# Patient Record
Sex: Female | Born: 1940
Health system: Southern US, Community
[De-identification: ages and names within clinical notes are randomized; demographics above are authoritative.]

## PROBLEM LIST (undated history)

## (undated) DIAGNOSIS — M1990 Unspecified osteoarthritis, unspecified site: Secondary | ICD-10-CM

## (undated) DIAGNOSIS — Z87442 Personal history of urinary calculi: Secondary | ICD-10-CM

## (undated) DIAGNOSIS — T4145XA Adverse effect of unspecified anesthetic, initial encounter: Secondary | ICD-10-CM

## (undated) DIAGNOSIS — Z973 Presence of spectacles and contact lenses: Secondary | ICD-10-CM

## (undated) DIAGNOSIS — C801 Malignant (primary) neoplasm, unspecified: Secondary | ICD-10-CM

## (undated) DIAGNOSIS — R351 Nocturia: Secondary | ICD-10-CM

## (undated) DIAGNOSIS — K219 Gastro-esophageal reflux disease without esophagitis: Secondary | ICD-10-CM

## (undated) DIAGNOSIS — J189 Pneumonia, unspecified organism: Secondary | ICD-10-CM

## (undated) DIAGNOSIS — N1330 Unspecified hydronephrosis: Secondary | ICD-10-CM

## (undated) DIAGNOSIS — T8859XA Other complications of anesthesia, initial encounter: Secondary | ICD-10-CM

## (undated) DIAGNOSIS — R739 Hyperglycemia, unspecified: Secondary | ICD-10-CM

## (undated) DIAGNOSIS — F32A Depression, unspecified: Secondary | ICD-10-CM

## (undated) DIAGNOSIS — E119 Type 2 diabetes mellitus without complications: Secondary | ICD-10-CM

## (undated) DIAGNOSIS — F419 Anxiety disorder, unspecified: Secondary | ICD-10-CM

## (undated) DIAGNOSIS — R350 Frequency of micturition: Secondary | ICD-10-CM

## (undated) DIAGNOSIS — N200 Calculus of kidney: Secondary | ICD-10-CM

## (undated) DIAGNOSIS — I10 Essential (primary) hypertension: Secondary | ICD-10-CM

## (undated) DIAGNOSIS — Z803 Family history of malignant neoplasm of breast: Secondary | ICD-10-CM

## (undated) DIAGNOSIS — H3530 Unspecified macular degeneration: Secondary | ICD-10-CM

## (undated) HISTORY — PX: TONSILLECTOMY AND ADENOIDECTOMY: SUR1326

## (undated) HISTORY — DX: Family history of malignant neoplasm of breast: Z80.3

## (undated) HISTORY — PX: CATARACT EXTRACTION W/ INTRAOCULAR LENS  IMPLANT, BILATERAL: SHX1307

---

## 1998-09-12 ENCOUNTER — Ambulatory Visit (HOSPITAL_COMMUNITY): Admission: RE | Admit: 1998-09-12 | Discharge: 1998-09-12 | Payer: Self-pay | Admitting: Gastroenterology

## 2001-12-26 ENCOUNTER — Ambulatory Visit (HOSPITAL_COMMUNITY): Admission: RE | Admit: 2001-12-26 | Discharge: 2001-12-26 | Payer: Self-pay | Admitting: Gastroenterology

## 2003-06-07 ENCOUNTER — Encounter: Admission: RE | Admit: 2003-06-07 | Discharge: 2003-06-07 | Payer: Self-pay

## 2003-09-11 ENCOUNTER — Encounter: Admission: RE | Admit: 2003-09-11 | Discharge: 2003-09-11 | Payer: Self-pay

## 2003-09-20 ENCOUNTER — Encounter: Admission: RE | Admit: 2003-09-20 | Discharge: 2003-09-20 | Payer: Self-pay

## 2004-01-28 ENCOUNTER — Encounter: Admission: RE | Admit: 2004-01-28 | Discharge: 2004-01-28 | Payer: Self-pay

## 2005-04-15 ENCOUNTER — Inpatient Hospital Stay (HOSPITAL_COMMUNITY): Admission: EM | Admit: 2005-04-15 | Discharge: 2005-05-04 | Payer: Self-pay | Admitting: Urology

## 2005-04-15 ENCOUNTER — Ambulatory Visit: Payer: Self-pay | Admitting: Pulmonary Disease

## 2005-04-15 ENCOUNTER — Ambulatory Visit: Payer: Self-pay | Admitting: Physical Medicine & Rehabilitation

## 2005-04-15 ENCOUNTER — Ambulatory Visit (HOSPITAL_COMMUNITY): Admission: RE | Admit: 2005-04-15 | Discharge: 2005-04-15 | Payer: Self-pay | Admitting: Urology

## 2005-04-15 ENCOUNTER — Emergency Department (HOSPITAL_COMMUNITY): Admission: EM | Admit: 2005-04-15 | Discharge: 2005-04-15 | Payer: Self-pay | Admitting: Emergency Medicine

## 2005-04-15 ENCOUNTER — Ambulatory Visit (HOSPITAL_BASED_OUTPATIENT_CLINIC_OR_DEPARTMENT_OTHER): Admission: RE | Admit: 2005-04-15 | Discharge: 2005-04-15 | Payer: Self-pay | Admitting: Urology

## 2005-04-15 HISTORY — PX: OTHER SURGICAL HISTORY: SHX169

## 2005-04-16 ENCOUNTER — Encounter (INDEPENDENT_AMBULATORY_CARE_PROVIDER_SITE_OTHER): Payer: Self-pay | Admitting: *Deleted

## 2005-04-28 ENCOUNTER — Ambulatory Visit: Payer: Self-pay | Admitting: Cardiology

## 2005-04-29 ENCOUNTER — Encounter: Payer: Self-pay | Admitting: Cardiology

## 2005-05-04 ENCOUNTER — Inpatient Hospital Stay
Admission: RE | Admit: 2005-05-04 | Discharge: 2005-05-11 | Payer: Self-pay | Admitting: Physical Medicine & Rehabilitation

## 2005-05-20 ENCOUNTER — Ambulatory Visit: Payer: Self-pay

## 2005-05-28 ENCOUNTER — Ambulatory Visit (HOSPITAL_COMMUNITY): Admission: RE | Admit: 2005-05-28 | Discharge: 2005-05-28 | Payer: Self-pay | Admitting: Urology

## 2005-06-16 ENCOUNTER — Encounter: Admission: RE | Admit: 2005-06-16 | Discharge: 2005-06-16 | Payer: Self-pay | Admitting: *Deleted

## 2005-06-17 ENCOUNTER — Ambulatory Visit (HOSPITAL_BASED_OUTPATIENT_CLINIC_OR_DEPARTMENT_OTHER): Admission: RE | Admit: 2005-06-17 | Discharge: 2005-06-17 | Payer: Self-pay | Admitting: *Deleted

## 2005-06-17 ENCOUNTER — Ambulatory Visit (HOSPITAL_COMMUNITY): Admission: RE | Admit: 2005-06-17 | Discharge: 2005-06-17 | Payer: Self-pay | Admitting: *Deleted

## 2005-06-17 HISTORY — PX: CARPAL TUNNEL RELEASE: SHX101

## 2005-07-24 ENCOUNTER — Ambulatory Visit: Payer: Self-pay | Admitting: Cardiology

## 2005-09-29 ENCOUNTER — Inpatient Hospital Stay (HOSPITAL_COMMUNITY): Admission: RE | Admit: 2005-09-29 | Discharge: 2005-10-02 | Payer: Self-pay | Admitting: Orthopedic Surgery

## 2005-09-29 HISTORY — PX: TOTAL KNEE ARTHROPLASTY: SHX125

## 2005-10-02 ENCOUNTER — Ambulatory Visit: Payer: Self-pay | Admitting: Physical Medicine & Rehabilitation

## 2005-10-02 ENCOUNTER — Inpatient Hospital Stay
Admission: RE | Admit: 2005-10-02 | Discharge: 2005-10-10 | Payer: Self-pay | Admitting: Physical Medicine & Rehabilitation

## 2005-11-23 ENCOUNTER — Encounter: Admission: RE | Admit: 2005-11-23 | Discharge: 2005-11-23 | Payer: Self-pay | Admitting: Orthopedic Surgery

## 2007-03-15 ENCOUNTER — Encounter: Admission: RE | Admit: 2007-03-15 | Discharge: 2007-03-15 | Payer: Self-pay

## 2008-04-04 ENCOUNTER — Encounter: Admission: RE | Admit: 2008-04-04 | Discharge: 2008-04-04 | Payer: Self-pay

## 2009-06-21 ENCOUNTER — Encounter: Admission: RE | Admit: 2009-06-21 | Discharge: 2009-06-21 | Payer: Self-pay | Admitting: Orthopedic Surgery

## 2010-09-14 ENCOUNTER — Emergency Department (HOSPITAL_COMMUNITY): Admission: EM | Admit: 2010-09-14 | Discharge: 2010-09-15 | Payer: Self-pay | Admitting: Emergency Medicine

## 2011-01-20 LAB — HEMOCCULT GUIAC POC 1CARD (OFFICE): Fecal Occult Bld: POSITIVE

## 2011-01-20 LAB — CBC
HCT: 36.2 % (ref 36.0–46.0)
Hemoglobin: 12 g/dL (ref 12.0–15.0)
MCH: 31.2 pg (ref 26.0–34.0)
MCHC: 33.1 g/dL (ref 30.0–36.0)
MCV: 94 fL (ref 78.0–100.0)
Platelets: 285 10*3/uL (ref 150–400)
RBC: 3.85 MIL/uL — ABNORMAL LOW (ref 3.87–5.11)
RDW: 14.9 % (ref 11.5–15.5)
WBC: 6.3 10*3/uL (ref 4.0–10.5)

## 2011-01-20 LAB — COMPREHENSIVE METABOLIC PANEL
ALT: 12 U/L (ref 0–35)
AST: 21 U/L (ref 0–37)
Albumin: 3.1 g/dL — ABNORMAL LOW (ref 3.5–5.2)
Alkaline Phosphatase: 48 U/L (ref 39–117)
BUN: 9 mg/dL (ref 6–23)
CO2: 26 mEq/L (ref 19–32)
Calcium: 8.3 mg/dL — ABNORMAL LOW (ref 8.4–10.5)
Chloride: 106 mEq/L (ref 96–112)
Creatinine, Ser: 0.77 mg/dL (ref 0.4–1.2)
GFR calc Af Amer: >60 mL/min (ref >60)
GFR calc non Af Amer: >60 mL/min (ref >60)
Glucose, Bld: 113 mg/dL — ABNORMAL HIGH (ref 70–99)
Potassium: 3.1 mEq/L — ABNORMAL LOW (ref 3.5–5.1)
Sodium: 139 mEq/L (ref 135–145)
Total Bilirubin: 0.6 mg/dL (ref 0.3–1.2)
Total Protein: 6.2 g/dL (ref 6.0–8.3)

## 2011-01-20 LAB — DIFFERENTIAL
Basophils Absolute: 0 10*3/uL (ref 0.0–0.1)
Basophils Relative: 1 % (ref 0–1)
Eosinophils Absolute: 0 10*3/uL (ref 0.0–0.7)
Eosinophils Relative: 0 % (ref 0–5)
Lymphocytes Relative: 22 % (ref 12–46)
Lymphs Abs: 1.4 10*3/uL (ref 0.7–4.0)
Monocytes Absolute: 0.9 10*3/uL (ref 0.1–1.0)
Monocytes Relative: 15 % — ABNORMAL HIGH (ref 3–12)
Neutro Abs: 4 10*3/uL (ref 1.7–7.7)
Neutrophils Relative %: 63 % (ref 43–77)

## 2011-01-20 LAB — CLOSTRIDIUM DIFFICILE EIA

## 2011-03-27 NOTE — Consult Note (Signed)
Dorothy Anderson                 ACCOUNT NO.:  [id number]   MEDICAL RECORD NO.:  [id number]          PATIENT TYPE:  INP   LOCATION:  0157                         FACILITY:  [facility]   PHYSICIAN:  Willa Rough, M.D.     DATE OF BIRTH:  1941-02-11   DATE OF CONSULTATION:  04/28/2005  DATE OF DISCHARGE:                                   CONSULTATION   Dorothy Anderson is a very pleasant 70 year old female, who was admitted for a  urology procedure and ended up having significant sepsis with septic shock.   With excellent care, the patient has stabilized nicely.  The patient's event  occurred on April 16, 2005.  Cardiac enzymes that were checked on June 8 and  June 9 showed a CPK rising from 60 to 450 and a troponin from 0.05 up to  6.0.  The EKG at that time did not show an acute change.  We will arrange  for a follow up EKG today.   The patient was on a vent and was septic and has been treated and is  improving greatly.  It is now time to reassess the cardiac status.   The patient has no cardiac history.  There was no prior cardiac  symptomatology.  Her exercise level was limited by her osteoarthritis.   PAST MEDICAL HISTORY:   ALLERGIES:  No known drug allergies.   MEDICATIONS:  Prior to admission, the patient was on Detrol, Naprosyn,  Prevacid, vitamins, and Vicodin.   OTHER MEDICAL PROBLEMS:  See the complete list below.   SOCIAL HISTORY:  The patient is married.  She does not smoke.  She works in  a Biomedical engineer.   FAMILY HISTORY:  See the records in the chart for family history.  It does  not play a significant role in the decision making in this patient at this  time.   REVIEW OF SYSTEMS:  At this point, the patient is doing well.  She continues  to be in bed recovering slowly from her episode of septic shock.  She is not  having any GI or GU symptoms.  Overall, she is weak.  She is not having any  chest pain.  Otherwise, her review of systems is negative.   PHYSICAL  EXAMINATION:  The patient is comfortable in the room.  I have  spoken with the patient and her husband in person.  Today, blood pressure is  120/60 with respirations at 21.  Pulse is 95.  Temperature is 38 degrees  Centigrade.  The patient is stable.  Her lungs are clear.  Respiratory  effort is not labored.  The patient is oriented to person, time, and place.  Her affect is normal.  HEENT:  No xanthelasma.  There is normal extraocular motion.  There are no  carotid bruits.  There is no jugular venous distention.  CARDIAC:  S1 with an S2.  There are no clicks or significant murmurs.  ABDOMEN:  Soft.  There are no bruits.  There is no organomegaly.  There is  no liver tenderness.  The patient has trace  peripheral edema.  There are no  major musculoskeletal deformities.   EKG is done earlier in June, show no acute change, and a repeat will be done  today.  BUN is 12 with a creatinine of 0.8.  Potassium is 3.5.   Echocardiogram had been done on April 16, 2005.  At that time, there was  flattening of the septum.  The patient was on a ventilator.  The overall  ejection fraction was 55-65%.  We will plan a follow up 2 D echocardiogram.   PROBLEMS:  1.  History of hypertension, controlled.  2.  Deconditioning with septic event, to be worked on over time.  3.  Severe osteoarthritis of the knees.  4.  Urolithiasis.  5.  Status post septic shock which is improving.  6.  Brief episode of atrial fibrillation while hospitalized this admission      with septic shock.  She has reverted to sinus rhythm.  No further work-      up of this is necessary.  7.  Status post ventilator around the time of septic shock, and this is now      resolved.  8.  *Abnormal cardiac enzymes at the time of septic shock.  We will plan for      a repeat EKG and 2 D echocardiogram.  If there is a significant wall      motion abnormality, we will definitely proceed with cardiac      catheterization.  If LV function is  normal, we will consider all options      but most likely proceed with a screening Cardiolite scan.   At this point, the patient's event occurred around the time of septic shock.  The work-up will be done as listed above.       JK/MEDQ  D:  04/28/2005  T:  04/28/2005  Job:  [id number]   cc:   Excell Seltzer. Annabell Howells, M.D.  [address]  [place]  [place] [address]  Fax: [phone]   Areatha Keas, M.D.  [address]  [address]  [place]  [place] [address]  Fax: [phone]   Willa Rough, M.D.

## 2011-03-27 NOTE — Op Note (Signed)
NAMEDOMONIC, HISCOX                 ACCOUNT NO.:  [id number]   MEDICAL RECORD NO.:  [id number]          PATIENT TYPE:  AMB   LOCATION:  NESC                         FACILITY:  [facility]   PHYSICIAN:  Excell Seltzer. Annabell Howells, M.D.    DATE OF BIRTH:  07/09/1941   DATE OF PROCEDURE:  04/15/2005  DATE OF DISCHARGE:                                 OPERATIVE REPORT   PROCEDURE:  Cystoscopy, left retrograde pyelogram, insertion of left double-  J stent.   PREOPERATIVE DIAGNOSIS:  Left proximal ureteral stone.   POSTOPERATIVE DIAGNOSIS:  Left proximal ureteral stone.   SURGEON:  Bjorn Pippin, M.D.   ANESTHESIA:  General.   DRAINS:  A 6 French x24 cm double-J stent.   COMPLICATIONS:  None.   INDICATIONS:  Ms. Chrisley is a 70 year old white female with onset earlier  today of severe left flank pain.  She was seen in the ER, where a CT scan  revealed a 6 mm left proximal ureteral stone.  She remained in pain in our  office, and it was felt that urgent stenting was indicated.   FINDINGS/PROCEDURE:  The patient was taken to the operating room, where  general anesthetic was induced.  She was given 400 mg of Cipro IV.  She was  placed in a lithotomy position.  Her perineum and genitalia were prepped  with Betadine solution.  She was draped in the usual sterile fashion.  Cystoscopy was performed using a 22 [place] scope and the 12 and 70 degree  lenses.  Examination revealed a normal urethra.  The bladder wall had  moderate trabeculation.  There was evidence of follicular cystitis.  No  tumors or stones were noted.  The ureteral orifices were unremarkable.   A retrograde pyelogram was performed.  The left ureteral orifice was  cannulated with a 5 [place] open-ended catheter.  Contrast was instilled in a  retrograde fashion.  This demonstrated a normal ureter up to the UPJ, where  an ovoid, radiolucent filling defect was noted, consistent with a stone.  This flushed back into the renal pelvis.  There was some  dilation of the  renal pelvis.   A guidewire was then passed up to the kidney.  The open-ended catheter was  removed, and a 6 French 24 cm double-J stent without string was inserted  without difficulty to the kidney under fluoroscopic guidance.  Upon  removal of the wire, a good coil was noted in the kidney and the bladder.  The bladder was then drained.  The cystoscope was removed.  The patient was  taken down from the lithotomy position.  Her anesthetic was reversed.  She  was removed to the recovery room in stable condition.  There were no  complications.       JJW/MEDQ  D:  04/15/2005  T:  04/15/2005  Job:  [id number]

## 2011-03-27 NOTE — Discharge Summary (Signed)
Dorothy Anderson, Dorothy Anderson                 ACCOUNT NO.:  [id number]   MEDICAL RECORD NO.:  [id number]          PATIENT TYPE:  INP   LOCATION:  5021                         FACILITY:  [facility]   PHYSICIAN:  Burnard Bunting, M.D.    DATE OF BIRTH:  Feb 08, 1941   DATE OF ADMISSION:  09/29/2005  DATE OF DISCHARGE:  10/02/2005                                 DISCHARGE SUMMARY   DISCHARGE DIAGNOSES:  Right knee arthritis.   SECONDARY DIAGNOSES:  None.   OPERATIONS AND NOTABLE PROCEDURES:  Right total knee replacement performed  on September 29, 2005.   HOSPITAL COURSE:  Dorothy Anderson is a 70 year old patient with end-stage right  knee arthritis.  She underwent uncomplicated right total knee replacement on  September 30, 2005; the patient tolerated the procedure well without  immediate complication.  The patient was started on Coumadin for DVT  prophylaxis and the CPM machine for motion.  The patient was slow to  mobilize.  Inpatient Rehab consultation was obtained.  Hematocrit was 32.4  on postop day #2.  The patient's incision was intact and dressing was  changed on postop day #3.  The patient had an otherwise unremarkable  recovery.  She was ambulating with the walker in the hall at the time of  discharge.  INR was therapeutic at 2.0 at the time of discharge.  She was  discharged in good condition.   ACTIVITY:  She will continue to mobilize with Physical Therapy.   DISCHARGE MEDICATIONS:  Discharge medications include Prevacid, Detrol,  Lopressor, Coumadin for DVT prophylaxis, Robaxin for muscle spasm, Percocet  for pain, multivitamin and calcium.   FOLLOWUP:  She will follow up with me in 7-10 days for suture removal.           ______________________________  G. Dorene Grebe, M.D.     GSD/MEDQ  D:  12/15/2005  T:  12/16/2005  Job:  [id number]

## 2011-03-27 NOTE — Discharge Summary (Signed)
Dorothy Anderson, Dorothy Anderson                 ACCOUNT NO.:  [id number]   MEDICAL RECORD NO.:  [id number]          PATIENT TYPE:  OUT   LOCATION:  DFTL                         FACILITY:  [facility]   PHYSICIAN:  Danae Chen, M.D.DATE OF BIRTH:  1941/09/25   DATE OF ADMISSION:  04/15/2005  DATE OF DISCHARGE:  04/15/2005                                 DISCHARGE SUMMARY   PRIMARY CARE PHYSICIAN:  Excell Seltzer. Annabell Howells, M.D.   CONSULTATIONS:  1.  Critical Care Medicine.  2.  Willa Rough, M.D., [facility].  3.  __________ Critical Care.   PROBLEM LIST:  1.  Left ureteral calculous with hydronephrosis status post cystoscopy and      stent on April 15, 2005.  2.  Pyelonephritis.  3.  Severe sepsis and septic shock post procedure on ventilation dependent      respiratory failure.  4.  Positive cardiac enzymes peri-interventionally.  5.  Deconditioning.  6.  Nephrolithiasis.   HISTORY:  Please see consultation reports for full consultation reports on  critical care medicine, cardiology, and urology.  The patient has had  multiple procedures.   BRIEF SUMMARY OF HOSPITAL COURSE:  The patient is a very pleasant 70-year-  old white female who was initially admitted for kidney stones and underwent  a procedure.  However, following her procedure she had multiple  complications including septic shock and sepsis with ventilation-dependent  respiratory failure and positive cardiac enzymes.  She was on the critical  care service for a period of time and then when she went off of their  service please refer to their notes for complete coverage during her ICU  stay.  In summary, she was eventually weaned off of her ventilator and was  stable enough for discharge back to the floor where we resumed her care.  The patient then continued to do better but did have some deconditioning.  Evaluation for her positive cardiac enzymes resulted in a cardiology  consult.  An echocardiogram was done, which revealed  a normal ejection  fraction.  No specific regional wall abnormalities.  Therefore, cardiology  opted to electively do a Cardiolite for risk stratification as an outpatient  for the patient.  In addition, in regards to her nephrolithiasis urology is  still planning on doing an outpatient lithotripsy for the patient on May 15, 2005.  At this time the patient is medically stable and we are awaiting a  SACU consult for rehabilitation for her deconditioning.   CONDITION ON DISCHARGE:  Improved.   CURRENT MEDICATIONS:  1.  Lovenox 50 mg subcutaneously daily.  2.  Sliding scale insulin.  3.  Clonidine 0.1 mg p.o. t.i.d.  4.  Isordil 10 mg p.o. t.i.d.  5.  Reglan 10 mg p.o. q.a.c. and q.h.s.  6.  Protonix 40 mg p.o. daily.  7.  Aspirin two chewable daily.  8.  Lopressor 25 mg p.o. t.i.d.  9.  Macrodantin 100 mg p.o. q.h.s.  10. Tylenol 650 mg p.o. q.6h. p.r.n.  11. Xopenex nebulizer 0.63 q.6h. p.r.n.  12. Vicodin.  13. Lortab 5/500 one-two tablets q.4-6h.  p.r.n.  14. Darvocet one-two tablets p.o. q-4-6h. p.r.n.  15. Ambien 5 mg p.o. q.h.s. p.r.n.  16. Magic mouthwash 30 mL p.o. t.i.d. p.r.n.   PHYSICAL EXAMINATION:  GENERAL:  At the time of discharge from our service  the patient is alert, oriented.  She is voiding well, having stool, and  eating well without complaints.  VITAL SIGNS:  Blood pressure is 112, 61, CBG is 122, temperature is 98.7,  pulse is 67.  LUNGS: Clear.  HEART:  Rate is regular.  ABDOMEN:  Soft.  She has no costovertebral guarding or tenderness.  EXTREMITIES:  No peripheral edema.  Extremities are warm.   DISPOSITION:  The patient is to be evaluated by [facility] today for SACU.   FOLLOW UP:  With [facility] for outpatient Cardiolite testing.  She  has an outpatient followup with urology for lithotripsy for her kidney  stones scheduled for May 15, 2005.       RLK/MEDQ  D:  05/04/2005  T:  05/04/2005  Job:  [id number]

## 2011-03-27 NOTE — Op Note (Signed)
[name], Dorothy                 ACCOUNT NO.:  [id number]   MEDICAL RECORD NO.:  [id number]          PATIENT TYPE:  AMB   LOCATION:  DSC                          FACILITY:  [facility]   PHYSICIAN:  Tennis Must Meyerdierks, M.D.DATE OF BIRTH:  October 15, 1941   DATE OF PROCEDURE:  06/17/2005  DATE OF DISCHARGE:                                 OPERATIVE REPORT   PREOPERATIVE DIAGNOSIS:  Right carpal tunnel syndrome.   POSTOPERATIVE DIAGNOSIS:  Right carpal tunnel syndrome.   PROCEDURE:  Decompression of median nerve, right carpal tunnel.   SURGEON:  Lowell Bouton, M.D.   ANESTHESIA:  Marcaine 0.5% local with sedation.   OPERATIVE FINDINGS:  The patient had no masses in the carpal canal. The  motor branch of the nerve was intact.   PROCEDURE:  Under 0.5% Marcaine local anesthesia with a tourniquet on the  right arm, the right hand was prepped and draped in usual fashion. After  exsanguinating the limb, the tourniquet was inflated to 250 mmHg. A 3 cm  longitudinal incision was made in the palm just ulnar to the thenar crease.  Sharp dissection was carried through the subcutaneous tissues. Blunt  dissection was carried through the superficial palmar fascia distal to the  transverse carpal ligament. A hemostat was then placed in the carpal canal  up against the hook of the hamate. The transverse carpal ligament was then  divided sharply on the ulnar border of the median nerve. The proximal end of  the ligament was divided with the scissors after dissecting the nerve away  from the undersurface of the ligament. The carpal canal was then palpated  and was found to be adequately decompressed. The nerve was examined and the  motor branch was identified. The wound was then irrigated with saline. The  skin was closed with 4-0 nylon sutures. Sterile dressings were applied  followed by a volar wrist splint. The patient tolerated the procedure well  and went to the recovery room awake and  stable in good condition.      Lowell Bouton, M.D.  Electronically Signed     EMM/MEDQ  D:  06/17/2005  T:  06/17/2005  Job:  [id number]   cc:   Areatha Keas, M.D.  [address]  [address]  [place]  [place] [address]  Fax: [phone]

## 2011-03-27 NOTE — Discharge Summary (Signed)
Dorothy Anderson, Dorothy                 ACCOUNT NO.:  [id number]   MEDICAL RECORD NO.:  [id number]          PATIENT TYPE:  ORB   LOCATION:  4527                         FACILITY:  [facility]   PHYSICIAN:  Dorothy Anderson, M.D.DATE OF BIRTH:  05-04-1941   DATE OF ADMISSION:  05/04/2005  DATE OF DISCHARGE:  05/11/2005                                 DISCHARGE SUMMARY   DISCHARGE DIAGNOSES:  1.  Deconditioning, staph sepsis, and respiratory failure.  2.  Urinary tract infection.  3.  Hypertension.   HISTORY OF PRESENT ILLNESS:  Dorothy Anderson is a 70 year old female with a history  of hypertension and DJD admitted on June 7th with back and flank pain  secondary to UPJ calculus causing mild to moderate hydronephrosis. She was  taken to the OR the same day by Dr. Neil Anderson for cystoscopy and stent placement.  Postoperative course was complicated by respiratory failure requiring  ventilator support due to sepsis from pyelonephritis. She was extubated  without difficulty. Her hospital course was uneventful with an episode of  atrial fibrillation with elevated cardiac enzymes. Dr. Myrtis Ser was consulted  for input and he recommended a 2-D echo that showed an EF of 60%, no wall  abnormality. Cardiology felt that the patient's cardiac enzymes were  elevated secondary to shock and Cardiolite could be done post discharge. The  patient was also set up for outpatient lithotripsy on May 14, 2005. To  continue on Macrodantin for UTI prophylaxis. Once the patient was mobilized  on physical therapy, she was noted to be at minimal assistance for mobility  which was limited secondary to severe OA bilateral knees and also due to  left shoulder weakness.   PAST MEDICAL HISTORY:  1.  Lung nodules.  2.  OA bilateral knees.  3.  Liver cysts with fatty infiltration.  4.  Gallbladder polyps.   ALLERGIES:  No known drug allergies.   SOCIAL HISTORY:  The patient is married, was independent in helping taking  care of her  2 year old mother prior to admission. She used a cane for  ambulation. She does not use any tobacco or alcohol.   HOSPITAL COURSE:  Dorothy Anderson was admitted to rehab on May 04, 2005,  for inpatient therapies to consist of PT and OT daily. Post admission showed  the patient had decrease in mobility and complained of severe pain in  bilateral knees. She was scheduled on hydrocodone to help with mobility in  the a.m. Bilateral knee sleeves were also ordered to help with knee support  and mobility. She was maintained on Lopressor for heart rate control. Subcu  Lovenox was continued for DVT prophylaxis.   Labs done post admission showed hemoglobin of 11.5, hematocrit 33.6, white  count 5.7, platelet count 475,000. Check of lytes revealed sodium of 141,  potassium 3.8, chloride 107, CO2 26, BUN 10, creatinine 0.8, glucose 106.  With scheduled pain meds and issues, the patient's mobility suddenly  improved. She was able to get to modified independent level; for activity  was able to ambulate 160 feet with a rolling walker. She was modified  independent for ADLs and was toileting.  She was set up for continued home  health therapy post discharge, to follow up with Dr. Neil Anderson for lithotripsy  and continue on Macrobid until advised otherwise by him. On July 12, 2005, the patient is discharged to home.   DISCHARGE MEDICATIONS:  1.  Enteric-coated aspirin 81 mg two per day.  2.  Catapres 0.1 mg q.8h.  3.  Lopressor 15 mg one-half q.8h.  4.  Macrodantin 100 mg q.h.s.  5.  Isordil 10 mg q.8h.  6.  Oxy-IR 5-10 mg q.4-6h. p.r.n. pain.   ACTIVITY LEVEL:  As tolerated with the use of walker and knee sleeve.   DIET:  Regular   SPECIAL INSTRUCTIONS:  To use oxycodone for pain control, no Naprosyn for  now.   FOLLOWUP:  The patient is to follow up with Dr. Neil Anderson for procedure, May 14, 2005; follow up with Dr. Phylliss Bob for routine check; follow up with Dr. Myrtis Ser for  cardiac issues; follow up  with Dr. Wynn Banker as needed.      Greg Cutter, P.A.      Dorothy Anderson, M.D.  Electronically Signed    PP/MEDQ  D:  07/29/2005  T:  07/30/2005  Job:  [id number]   cc:   Dorothy Anderson, M.D.  Fax: [phone]   Dorothy Anderson, M.D.  Fax: [phone]   Dorothy Anderson, M.D.  Fax: [phone]

## 2011-03-27 NOTE — Op Note (Signed)
NAMECARMISHA, Dorothy Anderson                 ACCOUNT NO.:  [id number]   MEDICAL RECORD NO.:  [id number]          PATIENT TYPE:  INP   LOCATION:  5021                         FACILITY:  [facility]   PHYSICIAN:  Burnard Bunting, M.D.    DATE OF BIRTH:  May 02, 1941   DATE OF PROCEDURE:  09/29/2005  DATE OF DISCHARGE:  10/02/2005                                 OPERATIVE REPORT   PREOPERATIVE DIAGNOSIS:  Right knee arthritis.   POSTOPERATIVE DIAGNOSIS:  Right knee arthritis.   PROCEDURE:  Right total knee arthroplasty.   SURGEON:  Burnard Bunting, M.D.   ASSISTANT:  Jerolyn Shin. Tresa Res, M.D.   ANESTHESIA:  General endotracheal anesthesia.   ESTIMATED BLOOD LOSS:  150 mL.   DRAINS:  Hemovac x 1.   TOURNIQUET TIME:  2 hours at 300 mmHg.   PROCEDURE IN DETAIL:  The patient was brought to the operating room where  general endotracheal anesthesia was induced.  Preoperative IV antibiotics  were administered.  A tourniquet was applied, the right leg including the  foot was prepped with DuraPrep solution and draped in a sterile manner.  An  Collier Flowers was used to cover up the field.  The leg was elevated and  exsanguinated with the Esmarch and the tourniquet was inflated.  The  anterior approach to the knee was utilized.  The skin and subcutaneous  tissue were sharply divided.  A median parapatellar arthrotomy was made.  The patellofemoral ligament was released.  The fat pad was partially  excised.  Osteophytes were removed from the femur and the tibia.  The ACL  and PCL were released.  The soft tissue was removed from the anterior distal  aspect of the femur.  Two pins were placed in the distal femur and proximal  tibia from medial to lateral obliquely, registration was achieved beginning  with the acetabular center with the center of the femoral head followed by  the ankle and then points around the knee.  Following registration, the  tibial cut was performed perpendicular to the mechanical axis of the lower  extremity.  Soft tissue tensioning device was then placed in both flexion  and extension to insure balance and symmetrics of soft tissue release.  A  distal femur cut was then made followed by the chamfer cuts.  A box cut was  performed.  The keel punch was performed in the tibia.  The patella was then  prepared using free hand technique.  The trial components were placed.  Full  range of motion was achieved with excellent patellar tracking.  The trial  components were removed.  The true components were cemented into position  with excess cement removed.  The cement was allowed to harden with the knee  in full extension.  The knee was taken through a range of motion and found  to be balanced in full flexion, full extension, as well as mid flexion with  good lateral ligament stability and excellent patellar tracking.  The  tourniquet was released, bleeding points encountered were controlled using  electrocautery.  A spacer was placed.  A  Hemovac drain was placed.  The  incision was closed over a bolster using 0 Vicryl suture followed by  interrupted inverted 2-0 Vicryl and skin staples.  The patient tolerated the  procedure well without any immediate complications.  A bulky dressing was  applied with a knee immobilizer.           ______________________________  G. Dorene Grebe, M.D.     GSD/MEDQ  D:  11/23/2005  T:  11/23/2005  Job:  [id number]

## 2011-03-27 NOTE — Discharge Summary (Signed)
NAMEZYLA, DASCENZO                 ACCOUNT NO.:  [id number]   MEDICAL RECORD NO.:  [id number]          PATIENT TYPE:  ORB   LOCATION:  4503                         FACILITY:  [facility]   PHYSICIAN:  Ellwood Dense, M.D.   DATE OF BIRTH:  [birth year]   DATE OF ADMISSION:  10/02/2005  DATE OF DISCHARGE:  10/10/2005                                 DISCHARGE SUMMARY   DISCHARGE DIAGNOSES:  1.  Right total knee arthroplasty November 21 secondary to degenerative disk      disease.  2.  Pain management.  3.  Coumadin for deep venous thrombosis prophylaxis.  4.  Postoperative anemia.  5.  Hypertension.  6.  Gastroesophageal reflux disease.   HISTORY OF PRESENT ILLNESS:  A 70 year old female admitted November 21 with  end-stage changes to the right knee and no relief with conservative care.  Underwent a right total knee arthroplasty November 21 per Dr. August Saucer.  Placed  on Coumadin for deep venous thrombosis prophylaxis.  Weightbearing as  tolerated.  CPM machine advance as tolerated.  Patient was admitted to  subacute care services.   PAST MEDICAL HISTORY:  See discharge diagnoses.  No alcohol or tobacco.   ALLERGIES:  None.   SOCIAL HISTORY:  [place] in [place], [place]  Husband can  assist, except no lifting.  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  Local  daughter works.  They live in a one-level home with two steps to entry.   MEDICATIONS PRIOR TO ADMISSION:  1.  Prevacid 30 mg daily.  2.  Detrol 4 mg daily.  3.  Lopressor 25 mg three times ad.  4.  Aspirin 81 mg daily.  5.  Multivitamin daily.  6.  Oxycodone as needed.   HOSPITAL COURSE:  Patient with progressive gains while on rehabilitation  services with therapies initiated daily.  The following issues were followed  during patient's rehabilitation course.  Pertaining to Mrs. Squyres's right  total knee arthroplasty November 21 surgical site healing nicely.  No signs  of infection.  Staples were to be removed  prior to discharge.  She was  ambulating extended household distances with a walker, weightbearing as  tolerated.  Home health therapies had been arranged.  She remained on  oxycodone, Robaxin as needed for pain management with good results.  Coumadin for deep venous thrombosis prophylaxis with latest INR of 2.5.  Home health nurse per [facility] to complete Coumadin  protocol.  Postoperative anemia stable with latest hemoglobin of 11,  hematocrit 31.4.  Blood pressures controlled with Lopressor without  orthostatic changes.  She had no gastrointestinal complaints and she  remained on Prevacid as prior to hospital admission.  No bowel or bladder  disturbances.  Overall for her functional status she was minimal guard for  bed mobility and transfers, ambulating 150 feet with a rolling walker,  minimal assist for stairs.  Home health therapies had been arranged.   DISCHARGE MEDICATIONS:  1.  Coumadin 4 mg daily to be completed October 29, 2005.  2.  Prevacid 30 mg daily.  3.  Lopressor 25 mg three times daily.  4.  Aspirin 81 mg daily.  5.  Multivitamin daily.  6.  Ferrous sulfate had been discontinued with hemoglobin greater than 11      and patient had had bouts of constipation due to iron supplement.  7.  Os-Cal 500 mg three times daily.  8.  OxyContin sustained release 10 mg twice daily x1 week and discontinue.  9.  Robaxin 500 mg every six hours as needed spasms.  10. Oxycodone as needed for breakthrough pain.   ACTIVITY:  As tolerated.   DIET:  Regular.   WOUND CARE:  Cleanse incision daily warm soap and water.   SPECIAL INSTRUCTIONS:  Home health nurse per [facility] to  check prothrombin time on Monday, October 12, 2005.  Patient to follow up  Dr. August Saucer, orthopedic services, call for appointment.      Mariam Dollar, P.A.    ______________________________  Ellwood Dense, M.D.    DA/MEDQ  D:  10/09/2005  T:  10/09/2005  Job:   [id number]   cc:   Minerva Areola L. August Saucer, M.D.  Fax: [phone]   Areatha Keas, M.D.  Fax: [phone]   Bertram Millard. Dahlstedt, M.D.  Fax: [phone]

## 2011-03-27 NOTE — H&P (Signed)
Dorothy Anderson, Dorothy Anderson                 ACCOUNT NO.:  [id number]   MEDICAL RECORD NO.:  [id number]          PATIENT TYPE:  ORB   LOCATION:  4503                         FACILITY:  [facility]   PHYSICIAN:  Erick Colace, M.D.DATE OF BIRTH:  [birth year]   DATE OF ADMISSION:  10/02/2005  DATE OF DISCHARGE:                                HISTORY & PHYSICAL   REASON FOR ADMISSION:  Right total knee replacement.   A 70 year old female admitted on September 29, 2005 with end-stage changes of  the right knee, no relief with conservative care.  She underwent a right  total knee arthroplasty on September 29, 2005 by Dr. August Saucer.  Placed on  Coumadin postoperatively for DVT prophylaxis and was made weightbearing as  tolerated.  Preoperatively she took Macrodantin 100 mg a day for UTI  prophylaxis.  Has had history of UTIs, but does not take any suppressive  agents on a chronic basis.  She is now admitted from [facility] for comprehensive  program with PT and OT.   REVIEW OF SYSTEMS:  Negative chest pain.  Negative shortness of breath.  Positive nausea.  Positive reflux.  Positive joint swelling in the knee.   PAST MEDICAL HISTORY:  1.  Hypertension.  2.  GERD.  3.  T and A.  4.  Kidney stone.  5.  Left carpal tunnel release.   HABITS:  Negative tobacco.  Negative ethanol.   FAMILY HISTORY:  Positive CAD.   SOCIAL HISTORY:  Lives with her husband in [place], [place].  Husband  can assist.  No lifting.  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  One-level home, two steps to entry.   FUNCTIONAL HISTORY:  Independent with a walker prior to admission.   FUNCTIONAL STATUS:  Requiring assistance with ADLs and mobility.   HOME MEDICATIONS:  1.  Was on a short course of Macrodantin but this is not chronic.  2.  Prevacid 30 mg p.o. daily.  3.  Detrol LA 4 mg p.o. daily.  4.  Lopressor 25 mg p.o. t.i.d.  5.  Aspirin 81 mg p.o. daily.  6.  Multivitamin with iron daily.  7.   Os-Cal daily.  8.  Oxy-IR p.r.n.   CURRENT MEDICATIONS:  1.  Coumadin per pharmacy protocol.  2.  Colace 100 mg p.o. b.i.d.  3.  Dulcolax suppository one daily p.r.n.  4.  Protonix 40 mg p.o. daily in substitution for Prevacid.  5.  Detrol LA 4 mg p.o. daily.  6.  Lopressor 25 mg p.o. t.i.d.  7.  Aspirin 81 mg p.o. daily.  8.  Multivitamin with iron one p.o. daily.  9.  FeSo4 325 p.o. daily, new medication.  10. Os-Cal 500 mg p.o. t.i.d.  11. Robaxin 500 mg p.o. q.6h. p.r.n.   PHYSICAL EXAMINATION:  VITAL SIGNS:  Blood pressure 131/70, pulse 80,  respirations 18, temperature 98.1.  GENERAL:  Well-developed, well-nourished female in no acute distress.  She  has oxygen on.  HEENT:  Is anicteric, not injected.  External ENT is normal.  NECK:  Supple  without adenopathy.  RESPIRATORY:  Good.  Lungs are clear to auscultation.  HEART:  Regular rate and rhythm.  No rubs, murmurs, or extra sounds.  EXTREMITIES:  No clubbing, cyanosis, edema.  ABDOMEN:  Positive bowel sounds, soft, nontender to palpation.  MUSCULOSKELETAL:  Motor strength 5/5 bilateral deltoid, biceps, triceps,  grip and 4/5 in the left hip flexor, quad, TA, and gastroc.  Right TA and  gastroc are 4/5.  Hip flexor, quad not tested, knee immobilizer plus pain.  She has decreased mobility right knee, decreased range of motion right knee.  Is only getting to about 35 degrees on CPM.  Has been up to 45 degrees per  Dr. August Saucer today.  NEUROLOGIC:  Sensation is normal.  Memory, mood, affect, and judgment are  all normal.   IMPRESSION:  1.  Right total knee arthroplasty causing decreased self-care and mobility      skills.  Has degenerative joint disease postoperative day #3.  2.  Right knee pain.  Pain management Oxy-IR and Robaxin.  3.  Deep venous thrombosis prophylaxis per pharmacy protocol Coumadin.  4.  Postoperative anemia.  Will follow up on CBC.  5.  Hypertension.  Continue Lopressor.  6.  Gastroesophageal reflux  disease.  Protonix in substitution for Prevacid.   ESTIMATED LENGTH OF STAY:  7-10 days.  Patient is good rehabilitation  candidate.      Erick Colace, M.D.  Electronically Signed     AEK/MEDQ  D:  10/02/2005  T:  10/02/2005  Job:  [id number]   cc:   Areatha Keas, M.D.  Fax: [phone]   Bertram Millard. Dahlstedt, M.D.  Fax: [phone]   Ellwood Dense, M.D.  Fax: [phone]   G. Dorene Grebe, M.D.  Fax: [phone]

## 2011-10-19 ENCOUNTER — Other Ambulatory Visit: Payer: Self-pay | Admitting: Urology

## 2011-10-26 ENCOUNTER — Encounter (HOSPITAL_COMMUNITY): Payer: Self-pay | Admitting: Pharmacy Technician

## 2011-10-29 ENCOUNTER — Encounter (HOSPITAL_COMMUNITY): Payer: Self-pay | Admitting: *Deleted

## 2011-10-29 NOTE — Progress Notes (Signed)
Pt was instructed to bring blue folder with her day of procedure.  Pt instructed to fill out the required papers in the blue folder as well as read through all information.  Pt instructed to follow laxative per office.  Pt instructed to eat light dinner and light snack and drink plenty of fluids day before procedure.

## 2011-11-09 ENCOUNTER — Ambulatory Visit (HOSPITAL_COMMUNITY): Payer: Medicare Other

## 2011-11-09 ENCOUNTER — Encounter (HOSPITAL_COMMUNITY): Payer: Self-pay

## 2011-11-09 ENCOUNTER — Ambulatory Visit (HOSPITAL_COMMUNITY)
Admission: RE | Admit: 2011-11-09 | Discharge: 2011-11-09 | Disposition: A | Discharge status: Home / Self Care | Payer: Medicare Other | Source: Ambulatory Visit | Attending: Urology | Admitting: Urology

## 2011-11-09 ENCOUNTER — Encounter (HOSPITAL_COMMUNITY)
Admission: RE | Disposition: A | Discharge status: Home / Self Care | Payer: Self-pay | Source: Ambulatory Visit | Attending: Urology

## 2011-11-09 DIAGNOSIS — M129 Arthropathy, unspecified: Secondary | ICD-10-CM | POA: Insufficient documentation

## 2011-11-09 DIAGNOSIS — I129 Hypertensive chronic kidney disease with stage 1 through stage 4 chronic kidney disease, or unspecified chronic kidney disease: Secondary | ICD-10-CM | POA: Insufficient documentation

## 2011-11-09 DIAGNOSIS — Z01818 Encounter for other preprocedural examination: Secondary | ICD-10-CM | POA: Insufficient documentation

## 2011-11-09 DIAGNOSIS — K219 Gastro-esophageal reflux disease without esophagitis: Secondary | ICD-10-CM | POA: Insufficient documentation

## 2011-11-09 DIAGNOSIS — Z79899 Other long term (current) drug therapy: Secondary | ICD-10-CM | POA: Insufficient documentation

## 2011-11-09 DIAGNOSIS — N201 Calculus of ureter: Secondary | ICD-10-CM

## 2011-11-09 DIAGNOSIS — N181 Chronic kidney disease, stage 1: Secondary | ICD-10-CM | POA: Insufficient documentation

## 2011-11-09 HISTORY — DX: Adverse effect of unspecified anesthetic, initial encounter: T41.45XA

## 2011-11-09 HISTORY — DX: Other complications of anesthesia, initial encounter: T88.59XA

## 2011-11-09 HISTORY — DX: Gastro-esophageal reflux disease without esophagitis: K21.9

## 2011-11-09 HISTORY — PX: EXTRACORPOREAL SHOCK WAVE LITHOTRIPSY: SHX1557

## 2011-11-09 HISTORY — DX: Essential (primary) hypertension: I10

## 2011-11-09 SURGERY — LITHOTRIPSY, ESWL
Anesthesia: LOCAL | Laterality: Right

## 2011-11-09 MED ORDER — LEVOFLOXACIN 500 MG PO TABS
500.0000 mg | ORAL_TABLET | ORAL | Status: AC
  Administered 2011-11-09: 500 mg via ORAL
  Filled 2011-11-09: qty 1

## 2011-11-09 MED ORDER — DIAZEPAM 5 MG PO TABS
10.0000 mg | ORAL_TABLET | ORAL | Status: AC
  Administered 2011-11-09: 10 mg via ORAL

## 2011-11-09 MED ORDER — DIPHENHYDRAMINE HCL 25 MG PO CAPS
25.0000 mg | ORAL_CAPSULE | ORAL | Status: AC
  Administered 2011-11-09: 25 mg via ORAL

## 2011-11-09 MED ORDER — DEXTROSE-NACL 5-0.45 % IV SOLN
INTRAVENOUS | Status: DC
  Administered 2011-11-09: 07:00:00 via INTRAVENOUS

## 2011-11-09 NOTE — H&P (Signed)
Urology Admission H&P  Chief Complaint: kidney stone  History of Present Illness: this elderly female presents for lithotripsy. She was seen in my office in November, 2012 for recurrent pyuria, although this was asymptomatic. She does have a history of prior urolithiasis. Renal ultrasound was performed on 10/07/2011. This revealed normal appearing kidneys except for right hydronephrosis which was not documented before. Followup CT scan revealed a 7 mm mid ureteral stone on the right. It was felt that this was most likely long-standing, as the patient has not had severe pain recently. We talked about treatment options-lithotripsy versus ureteroscopy and stone extraction. She would like to proceed with lithotripsy. She has been on p.o. Antibiotics for the past few weeks. She is asymptomatic from a urinary standpoint.  Past Medical History  Diagnosis Date  . Hypertension   . Chronic kidney disease     kidney stone 1  . GERD (gastroesophageal reflux disease)   . Complication of anesthesia     slow to wake up   Past Surgical History  Procedure Date  . Joint replacement     rt knee replacement nov. 2006    Home Medications:  Prescriptions prior to admission  Medication Sig Dispense Refill  . HYDROcodone-acetaminophen (VICODIN) 5-500 MG per tablet Take 1 tablet by mouth every 6 (six) hours as needed. PAIN        . lansoprazole (PREVACID) 15 MG capsule Take 15 mg by mouth 2 (two) times daily.        . metoprolol tartrate (LOPRESSOR) 25 MG tablet Take 25 mg by mouth 2 (two) times daily.        Marland Kitchen sulfamethoxazole-trimethoprim (BACTRIM DS) 800-160 MG per tablet Take 1 tablet by mouth 2 (two) times daily.        Marland Kitchen aspirin EC 81 MG tablet Take 81 mg by mouth at bedtime.        . Calcium Carbonate-Vitamin D (CALCIUM-D) 600-400 MG-UNIT TABS Take 1-2 tablets by mouth daily. 1 TABLET ONE DAY THEN 2 TABLETS THE NEXT DAY       . cholecalciferol (VITAMIN D) 1000 UNITS tablet Take 1,000 Units by mouth  daily.        Marland Kitchen docusate sodium (COLACE) 100 MG capsule Take 100 mg by mouth 2 (two) times daily.        . Glucosamine-Chondroitin (COSAMIN DS PO) Take 1 tablet by mouth daily.        Marland Kitchen latanoprost (XALATAN) 0.005 % ophthalmic solution Place 1 drop into both eyes at bedtime.        . Multiple Vitamins-Minerals (MULTIVITAMINS THER. W/MINERALS) TABS Take 1 tablet by mouth daily.        . naproxen (NAPROSYN) 500 MG tablet Take 500 mg by mouth 2 (two) times daily with a meal.         Allergies:  Allergies  Allergen Reactions  . Darvon Other (See Comments)    MADE PT STAY AWAKE   . Macrobid (Nitrofurantoin Monohydrate Macrocrystals) Nausea And Vomiting    History reviewed. No pertinent family history. Social History:  reports that she has never smoked. She does not have any smokeless tobacco history on file. She reports that she does not drink alcohol or use illicit drugs.  Review of Systems  All other systems reviewed and are negative.    Physical Exam:  Vital signs in last 24 hours: Temp:  [97.8 F (36.6 C)] 97.8 F (36.6 C) (12/31 0556) Pulse Rate:  [66] 66  (12/31 0556) Resp:  [18]  18  (12/31 0556) BP: (119)/(63) 119/63 mmHg (12/31 0556) SpO2:  [96 %] 96 % (12/31 0556) Weight:  [90.379 kg (199 lb 4 oz)] 199 lb 4 oz (90.379 kg) (12/31 0556) Physical Exam  Constitutional: She is oriented to person, place, and time. She appears well-developed and well-nourished.  HENT:  Head: Normocephalic.  Eyes: Conjunctivae are normal. Pupils are equal, round, and reactive to light.  Neck: Normal range of motion.  Cardiovascular: Normal rate and normal heart sounds.   Respiratory: Effort normal and breath sounds normal.  GI: Soft. Bowel sounds are normal.  Musculoskeletal: Normal range of motion.  Neurological: She is alert and oriented to person, place, and time.  Skin: Skin is warm and dry.  Psychiatric: She has a normal mood and affect. Her behavior is normal.    Laboratory Data:    No results found for this or any previous visit (from the past 24 hour(s)). No results found for this or any previous visit (from the past 240 hour(s)). Creatinine: No results found for this basename: CREATININE:7 in the last 168 hours Baseline Creatinine:  Impression/Assessment:  7 mm mid right ureteral stone  Plan:  Extracorporeal shockwave lithotripsy.  Marcine Matar M 11/09/2011, 7:17 AM

## 2011-11-13 DIAGNOSIS — N390 Urinary tract infection, site not specified: Secondary | ICD-10-CM | POA: Diagnosis not present

## 2011-11-13 DIAGNOSIS — E559 Vitamin D deficiency, unspecified: Secondary | ICD-10-CM | POA: Diagnosis not present

## 2011-11-13 DIAGNOSIS — E1129 Type 2 diabetes mellitus with other diabetic kidney complication: Secondary | ICD-10-CM | POA: Diagnosis not present

## 2011-11-13 DIAGNOSIS — Z0000 Encounter for general adult medical examination without abnormal findings: Secondary | ICD-10-CM | POA: Diagnosis not present

## 2011-11-13 DIAGNOSIS — Z23 Encounter for immunization: Secondary | ICD-10-CM | POA: Diagnosis not present

## 2011-11-19 DIAGNOSIS — H40019 Open angle with borderline findings, low risk, unspecified eye: Secondary | ICD-10-CM | POA: Diagnosis not present

## 2011-11-24 DIAGNOSIS — N182 Chronic kidney disease, stage 2 (mild): Secondary | ICD-10-CM | POA: Diagnosis not present

## 2011-11-24 DIAGNOSIS — I10 Essential (primary) hypertension: Secondary | ICD-10-CM | POA: Diagnosis not present

## 2011-11-24 DIAGNOSIS — E1165 Type 2 diabetes mellitus with hyperglycemia: Secondary | ICD-10-CM | POA: Diagnosis not present

## 2011-11-24 DIAGNOSIS — E559 Vitamin D deficiency, unspecified: Secondary | ICD-10-CM | POA: Diagnosis not present

## 2011-11-24 DIAGNOSIS — E1129 Type 2 diabetes mellitus with other diabetic kidney complication: Secondary | ICD-10-CM | POA: Diagnosis not present

## 2011-12-03 DIAGNOSIS — IMO0002 Reserved for concepts with insufficient information to code with codable children: Secondary | ICD-10-CM | POA: Diagnosis not present

## 2011-12-03 DIAGNOSIS — M159 Polyosteoarthritis, unspecified: Secondary | ICD-10-CM | POA: Diagnosis not present

## 2011-12-03 DIAGNOSIS — M25519 Pain in unspecified shoulder: Secondary | ICD-10-CM | POA: Diagnosis not present

## 2011-12-08 DIAGNOSIS — J019 Acute sinusitis, unspecified: Secondary | ICD-10-CM | POA: Diagnosis not present

## 2011-12-22 DIAGNOSIS — IMO0001 Reserved for inherently not codable concepts without codable children: Secondary | ICD-10-CM | POA: Diagnosis not present

## 2011-12-22 DIAGNOSIS — I10 Essential (primary) hypertension: Secondary | ICD-10-CM | POA: Diagnosis not present

## 2012-01-06 DIAGNOSIS — N200 Calculus of kidney: Secondary | ICD-10-CM | POA: Diagnosis not present

## 2012-01-06 DIAGNOSIS — N1330 Unspecified hydronephrosis: Secondary | ICD-10-CM | POA: Diagnosis not present

## 2012-01-11 ENCOUNTER — Other Ambulatory Visit: Payer: Self-pay | Admitting: Urology

## 2012-01-21 ENCOUNTER — Encounter (HOSPITAL_BASED_OUTPATIENT_CLINIC_OR_DEPARTMENT_OTHER): Payer: Self-pay | Admitting: *Deleted

## 2012-01-21 NOTE — Progress Notes (Signed)
NPO AFTER MN. ARRIVES AT 0715. NEEDS ISTAT AND EKG. WILL TAKE LOPRESSOR AND PREVACID AM OF SURG. W/ SIP OF WATER.

## 2012-01-26 NOTE — H&P (Signed)
H&P  Chief Complaint: kidney stone  History of Present Illness: Dorothy Anderson is a 71 y.o. year old female who presents at this time for ureteroscopy. She presented in December, 2012 to followup recurrent urinary tract infections. Renal ultrasound revealed hydronephrosis, followup CT revealed a 7 mm midureteral stone on the right. She underwent lithotripsy on 11/09/2011. Followup revealed non-fragmentation and non-passage of the stone. She presents at this time for cystoscopy, retrograde ureteropyelogram, ureteroscopy and holmium laser of the stone.  Past Medical History  Diagnosis Date  . Hypertension   . GERD (gastroesophageal reflux disease)   . Complication of anesthesia     slow to wake up  . History of kidney stones   . Hydronephrosis, left   . Calculus of left kidney   . Hyperglycemia   . Nocturia   . Frequency of urination   . Glaucoma BILATERAL EYES  . Arthritis KNEES  . Wears glasses     Past Surgical History  Procedure Date  . Total knee arthroplasty 09-29-2005    RIGHT  . Carpal tunnel release 06-17-2005    RIGHT  . Cysto/ left retrograde pyelogram/ left ureteral stent placement 04-15-2005    URETERAL STONE  . Cataract extraction w/ intraocular lens  implant, bilateral   . Extracorporeal shock wave lithotripsy 11-09-11    RIGHT  . Tonsillectomy and adenoidectomy AGE 72    Home Medications:  No current facility-administered medications on file as of .   Medications Prior to Admission  Medication Sig Dispense Refill  . aspirin EC 81 MG tablet Take 81 mg by mouth at bedtime.       . Calcium Carbonate-Vitamin D (CALCIUM-D) 600-400 MG-UNIT TABS Take 1-2 tablets by mouth daily. 1 TABLET ONE DAY THEN 2 TABLETS THE NEXT DAY      . cephALEXin (KEFLEX) 500 MG capsule Take 500 mg by mouth 2 (two) times daily.      . cholecalciferol (VITAMIN D) 1000 UNITS tablet Take 1,000 Units by mouth daily.       Marland Kitchen docusate sodium (COLACE) 100 MG capsule Take 100 mg by mouth 2 (two)  times daily.       . Glucosamine-Chondroitin (COSAMIN DS PO) Take 1 tablet by mouth daily.       Marland Kitchen HYDROcodone-acetaminophen (VICODIN) 5-500 MG per tablet Take 1 tablet by mouth every 6 (six) hours as needed. PAIN       . lansoprazole (PREVACID) 15 MG capsule Take 15 mg by mouth 2 (two) times daily.       Marland Kitchen latanoprost (XALATAN) 0.005 % ophthalmic solution Place 1 drop into both eyes at bedtime.       . metFORMIN (GLUCOPHAGE-XR) 500 MG 24 hr tablet Take 500 mg by mouth daily with breakfast.      . metoprolol tartrate (LOPRESSOR) 25 MG tablet Take 25 mg by mouth 2 (two) times daily.       . Multiple Vitamins-Minerals (MULTIVITAMINS THER. W/MINERALS) TABS Take 1 tablet by mouth daily.       . naproxen (NAPROSYN) 500 MG tablet Take 500 mg by mouth 2 (two) times daily with a meal.         Allergies:  Allergies  Allergen Reactions  . Darvon Other (See Comments)    MADE PT STAY AWAKE   . Macrobid (Nitrofurantoin Monohydrate Macrocrystals) Nausea And Vomiting    History reviewed. No pertinent family history.  Social History:  reports that she has never smoked. She has never used smokeless tobacco. She reports  that she does not drink alcohol or use illicit drugs.  ROS: A complete review of systems was performed.  All systems are negative except for pertinent findings as noted.she has not had fever, chills, nausea, vomiting, gross hematuria or dysuria.  Physical Exam:  Vital signs in last 24 hours:   General:  Alert and oriented, No acute distress. She is mildly obese HEENT: Normocephalic, atraumatic Neck: No JVD or lymphadenopathy Cardiovascular: Regular rate and rhythm Lungs: Clear bilaterally Abdomen: Soft, nontender, nondistended, no abdominal masses Back: No CVA tenderness Extremities: No edema Neurologic: Grossly intact  Laboratory Data:  No results found for this or any previous visit (from the past 24 hour(s)). No results found for this or any previous visit (from the past  240 hour(s)). Creatinine: No results found for this basename: CREATININE:7 in the last 168 hours  Radiologic Imaging: No results found.  Impression/Assessment:  Persistent 7 mm right mid ureteral stone with hydronephrosis. She failed lithotripsy  Plan:  She is here today for cystoscopy, right retrograde ureteropyelogram, ureteroscopy, holmium laser and extraction of right ureteral stone.  Chelsea Aus 01/26/2012, 5:12 PM  Bertram Millard. Shariq Puig MD

## 2012-01-27 ENCOUNTER — Ambulatory Visit (HOSPITAL_BASED_OUTPATIENT_CLINIC_OR_DEPARTMENT_OTHER): Payer: Medicare Other | Admitting: Anesthesiology

## 2012-01-27 ENCOUNTER — Encounter (HOSPITAL_BASED_OUTPATIENT_CLINIC_OR_DEPARTMENT_OTHER): Payer: Self-pay | Admitting: Anesthesiology

## 2012-01-27 ENCOUNTER — Encounter (HOSPITAL_BASED_OUTPATIENT_CLINIC_OR_DEPARTMENT_OTHER)
Admission: RE | Disposition: A | Discharge status: Home / Self Care | Payer: Self-pay | Source: Ambulatory Visit | Attending: Urology

## 2012-01-27 ENCOUNTER — Other Ambulatory Visit: Payer: Self-pay

## 2012-01-27 ENCOUNTER — Ambulatory Visit (HOSPITAL_BASED_OUTPATIENT_CLINIC_OR_DEPARTMENT_OTHER)
Admission: RE | Admit: 2012-01-27 | Discharge: 2012-01-27 | Disposition: A | Discharge status: Home / Self Care | Payer: Medicare Other | Source: Ambulatory Visit | Attending: Urology | Admitting: Urology

## 2012-01-27 ENCOUNTER — Encounter (HOSPITAL_BASED_OUTPATIENT_CLINIC_OR_DEPARTMENT_OTHER): Payer: Self-pay | Admitting: *Deleted

## 2012-01-27 DIAGNOSIS — N201 Calculus of ureter: Secondary | ICD-10-CM | POA: Diagnosis not present

## 2012-01-27 DIAGNOSIS — Z96659 Presence of unspecified artificial knee joint: Secondary | ICD-10-CM | POA: Diagnosis not present

## 2012-01-27 DIAGNOSIS — Z79899 Other long term (current) drug therapy: Secondary | ICD-10-CM | POA: Diagnosis not present

## 2012-01-27 DIAGNOSIS — I10 Essential (primary) hypertension: Secondary | ICD-10-CM | POA: Insufficient documentation

## 2012-01-27 DIAGNOSIS — K219 Gastro-esophageal reflux disease without esophagitis: Secondary | ICD-10-CM | POA: Diagnosis not present

## 2012-01-27 DIAGNOSIS — N135 Crossing vessel and stricture of ureter without hydronephrosis: Secondary | ICD-10-CM | POA: Diagnosis not present

## 2012-01-27 DIAGNOSIS — N1330 Unspecified hydronephrosis: Secondary | ICD-10-CM | POA: Insufficient documentation

## 2012-01-27 DIAGNOSIS — Z7982 Long term (current) use of aspirin: Secondary | ICD-10-CM | POA: Insufficient documentation

## 2012-01-27 DIAGNOSIS — N200 Calculus of kidney: Secondary | ICD-10-CM | POA: Diagnosis not present

## 2012-01-27 HISTORY — DX: Calculus of kidney: N20.0

## 2012-01-27 HISTORY — PX: CYSTOSCOPY/RETROGRADE/URETEROSCOPY: SHX5316

## 2012-01-27 HISTORY — DX: Hyperglycemia, unspecified: R73.9

## 2012-01-27 HISTORY — DX: Unspecified osteoarthritis, unspecified site: M19.90

## 2012-01-27 HISTORY — DX: Presence of spectacles and contact lenses: Z97.3

## 2012-01-27 HISTORY — DX: Frequency of micturition: R35.0

## 2012-01-27 HISTORY — DX: Nocturia: R35.1

## 2012-01-27 HISTORY — DX: Unspecified hydronephrosis: N13.30

## 2012-01-27 HISTORY — DX: Personal history of urinary calculi: Z87.442

## 2012-01-27 LAB — POCT I-STAT 4, (NA,K, GLUC, HGB,HCT)
Glucose, Bld: 132 mg/dL — ABNORMAL HIGH (ref 70–99)
HCT: 39 % (ref 36.0–46.0)
Hemoglobin: 13.3 g/dL (ref 12.0–15.0)
Potassium: 4 mEq/L (ref 3.5–5.1)
Sodium: 144 mEq/L (ref 135–145)

## 2012-01-27 LAB — GLUCOSE, CAPILLARY: Glucose-Capillary: 98 mg/dL (ref 70–99)

## 2012-01-27 SURGERY — CYSTOSCOPY/RETROGRADE/URETEROSCOPY
Anesthesia: General | Site: Ureter | Laterality: Right | Wound class: Clean Contaminated

## 2012-01-27 MED ORDER — FENTANYL CITRATE 0.05 MG/ML IJ SOLN
INTRAMUSCULAR | Status: DC | PRN
  Administered 2012-01-27: 50 ug via INTRAVENOUS
  Administered 2012-01-27 (×2): 25 ug via INTRAVENOUS

## 2012-01-27 MED ORDER — LIDOCAINE HCL (CARDIAC) 20 MG/ML IV SOLN
INTRAVENOUS | Status: DC | PRN
  Administered 2012-01-27: 60 mg via INTRAVENOUS

## 2012-01-27 MED ORDER — SULFAMETHOXAZOLE-TRIMETHOPRIM 800-160 MG PO TABS: 1.0000 | ORAL_TABLET | Freq: Two times a day (BID) | ORAL | Status: AC

## 2012-01-27 MED ORDER — LACTATED RINGERS IV SOLN
INTRAVENOUS | Status: DC
  Administered 2012-01-27: 08:00:00 via INTRAVENOUS

## 2012-01-27 MED ORDER — CEFAZOLIN SODIUM 1-5 GM-% IV SOLN
1.0000 g | INTRAVENOUS | Status: AC
  Administered 2012-01-27: 2 g via INTRAVENOUS

## 2012-01-27 MED ORDER — ONDANSETRON HCL 4 MG/2ML IJ SOLN
INTRAMUSCULAR | Status: DC | PRN
  Administered 2012-01-27: 4 mg via INTRAVENOUS

## 2012-01-27 MED ORDER — FENTANYL CITRATE 0.05 MG/ML IJ SOLN
25.0000 ug | INTRAMUSCULAR | Status: DC | PRN
  Administered 2012-01-27 (×2): 25 ug via INTRAVENOUS

## 2012-01-27 MED ORDER — HYDROCODONE-ACETAMINOPHEN 5-500 MG PO TABS: 1.0000 | ORAL_TABLET | Freq: Four times a day (QID) | ORAL | Status: AC | PRN

## 2012-01-27 MED ORDER — PROPOFOL 10 MG/ML IV EMUL
INTRAVENOUS | Status: DC | PRN
  Administered 2012-01-27: 180 mg via INTRAVENOUS

## 2012-01-27 MED ORDER — KETOROLAC TROMETHAMINE 30 MG/ML IJ SOLN
15.0000 mg | Freq: Once | INTRAMUSCULAR | Status: AC | PRN
  Administered 2012-01-27: 30 mg via INTRAVENOUS

## 2012-01-27 MED ORDER — SODIUM CHLORIDE 0.9 % IR SOLN
Status: DC | PRN
  Administered 2012-01-27: 6000 mL

## 2012-01-27 MED ORDER — HYDROCODONE-ACETAMINOPHEN 5-325 MG PO TABS
1.0000 | ORAL_TABLET | Freq: Four times a day (QID) | ORAL | Status: DC | PRN
  Administered 2012-01-27: 1 via ORAL

## 2012-01-27 MED ORDER — PROMETHAZINE HCL 25 MG/ML IJ SOLN: 6.2500 mg | INTRAMUSCULAR | Status: DC | PRN

## 2012-01-27 MED ORDER — OXYBUTYNIN CHLORIDE 5 MG PO TABS: 5.0000 mg | ORAL_TABLET | Freq: Three times a day (TID) | ORAL | Status: DC

## 2012-01-27 MED ORDER — IOHEXOL 350 MG/ML SOLN
INTRAVENOUS | Status: DC | PRN
  Administered 2012-01-27: 50 mL via INTRAVENOUS

## 2012-01-27 MED ORDER — BELLADONNA ALKALOIDS-OPIUM 16.2-60 MG RE SUPP
RECTAL | Status: DC | PRN
  Administered 2012-01-27: 1 via RECTAL

## 2012-01-27 SURGICAL SUPPLY — 27 items
ADAPTER CATH URET PLST 4-6FR (CATHETERS) IMPLANT
BAG DRAIN URO-CYSTO SKYTR STRL (DRAIN) ×3 IMPLANT
BASKET ZERO TIP NITINOL 2.4FR (BASKET) ×3 IMPLANT
CANISTER SUCT LVC 12 LTR MEDI- (MISCELLANEOUS) ×3 IMPLANT
CATH INTERMIT  6FR 70CM (CATHETERS) IMPLANT
CLOTH BEACON ORANGE TIMEOUT ST (SAFETY) ×3 IMPLANT
DRAPE CAMERA CLOSED 9X96 (DRAPES) ×3 IMPLANT
GLOVE BIO SURGEON STRL SZ8 (GLOVE) ×3 IMPLANT
GLOVE BIOGEL PI IND STRL 6.5 (GLOVE) ×2 IMPLANT
GLOVE BIOGEL PI IND STRL 7.0 (GLOVE) ×2 IMPLANT
GLOVE BIOGEL PI INDICATOR 6.5 (GLOVE) ×1
GLOVE BIOGEL PI INDICATOR 7.0 (GLOVE) ×1
GOWN STRL REIN XL XLG (GOWN DISPOSABLE) ×3 IMPLANT
GOWN SURGICAL LARGE (GOWNS) ×3 IMPLANT
GOWN XL W/COTTON TOWEL STD (GOWNS) ×3 IMPLANT
GUIDEWIRE 0.038 PTFE COATED (WIRE) IMPLANT
GUIDEWIRE ANG ZIPWIRE 038X150 (WIRE) IMPLANT
GUIDEWIRE STR DUAL SENSOR (WIRE) ×3 IMPLANT
IV NS IRRIG 3000ML ARTHROMATIC (IV SOLUTION) ×6 IMPLANT
LASER FIBER DISP (UROLOGICAL SUPPLIES) ×3 IMPLANT
NS IRRIG 500ML POUR BTL (IV SOLUTION) ×3 IMPLANT
PACK CYSTOSCOPY (CUSTOM PROCEDURE TRAY) ×3 IMPLANT
SHEATH ACCESS URETERAL 38CM (SHEATH) ×3 IMPLANT
SHEATH URET ACCESS 12FR/35CM (UROLOGICAL SUPPLIES) ×3 IMPLANT
SPONGE GAUZE 4X4 12PLY (GAUZE/BANDAGES/DRESSINGS) ×3 IMPLANT
STENT 6X24 (STENTS) IMPLANT
STENT URET 6FRX24 CONTOUR (STENTS) ×3 IMPLANT

## 2012-01-27 NOTE — Interval H&P Note (Signed)
History and Physical Interval Note:  01/27/2012 8:30 AM  Dorothy Anderson  has presented today for surgery, with the diagnosis of Right Hydronephrosis  The various methods of treatment have been discussed with the patient and family. After consideration of risks, benefits and other options for treatment, the patient has consented to  Procedure(s) (LRB): CYSTOSCOPY/RETROGRADE/URETEROSCOPY (Right) as a surgical intervention .  The patients' history has been reviewed, patient examined, no change in status, stable for surgery.  I have reviewed the patients' chart and labs.  Questions were answered to the patient's satisfaction.     Chelsea Aus

## 2012-01-27 NOTE — Anesthesia Procedure Notes (Signed)
Procedure Name: LMA Insertion Date/Time: 01/27/2012 8:40 AM Performed by: Renella Cunas D Pre-anesthesia Checklist: Patient identified, Emergency Drugs available, Suction available and Patient being monitored Patient Re-evaluated:Patient Re-evaluated prior to inductionOxygen Delivery Method: Circle System Utilized Preoxygenation: Pre-oxygenation with 100% oxygen Intubation Type: IV induction Ventilation: Mask ventilation without difficulty LMA: LMA inserted LMA Size: 4.0 Number of attempts: 1 Airway Equipment and Method: bite block Placement Confirmation: positive ETCO2 Tube secured with: Tape Dental Injury: Teeth and Oropharynx as per pre-operative assessment

## 2012-01-27 NOTE — Anesthesia Postprocedure Evaluation (Signed)
  Anesthesia Post-op Note  Patient: Dorothy Anderson  Procedure(s) Performed: Procedure(s) (LRB): CYSTOSCOPY/RETROGRADE/URETEROSCOPY (Right) HOLMIUM LASER APPLICATION (Right)  Patient Location: PACU  Anesthesia Type: General  Level of Consciousness: awake and alert   Airway and Oxygen Therapy: Patient Spontanous Breathing  Post-op Pain: mild  Post-op Assessment: Post-op Vital signs reviewed, Patient's Cardiovascular Status Stable, Respiratory Function Stable, Patent Airway and No signs of Nausea or vomiting  Post-op Vital Signs: stable  Complications: No apparent anesthesia complications

## 2012-01-27 NOTE — Transfer of Care (Signed)
Immediate Anesthesia Transfer of Care Note  Patient: Dorothy Anderson  Procedure(s) Performed: Procedure(s) (LRB): CYSTOSCOPY/RETROGRADE/URETEROSCOPY (Right) HOLMIUM LASER APPLICATION (Right)  Patient Location: PACU  Anesthesia Type: General  Level of Consciousness: awake, oriented, sedated and patient cooperative  Airway & Oxygen Therapy: Patient Spontanous Breathing and Patient connected to face mask oxygen  Post-op Assessment: Report given to PACU RN and Post -op Vital signs reviewed and stable  Post vital signs: Reviewed and stable  Complications: No apparent anesthesia complications

## 2012-01-27 NOTE — Op Note (Signed)
Preoperative diagnosis: Right mid ureteral stone, status post lithotripsy with incomplete fragmentation/passage  Postoperative diagnosis: Stone in right kidney with ureteral stricture and hydronephrosis  Principal procedure: Cystoscopy, right-sided ureteroscopy, dilation of ureteral stricture in mid ureter, holmium laser fragmentation of right renal stone, right renal stone extraction, double-J stent placement (24 cm x 6 French without tether)  Surgeon: Tinsley Lomas  Anesthesia: Gen. with LMA  Complications: None  Drains: 24 cm x 6 French contour double-J stent without tether  Indications: 71 year-old female with a right ureteral stone which was asymptomatic except for recurring infections and hydronephrosis. She underwent lithotripsy of this in December, 2012. There was inadequate fragmentation and passage. She presents at this time, with her stone having not passed, for ureteroscopy, holmium laser and extraction of stone. Risks and complications of the procedure have been discussed with the patient who understands these and desires to proceed.  Description of procedure: The patient was identified and properly marked in the holding room, and then received preoperative IV antibiotics. She was taken to the operating room where general anesthetic was administered with the LMA. She was placed in the dorsolithotomy position. Genitalia and perineum were prepped and draped. Time out was performed.  A 22 French panendoscope was advanced into her bladder which was normal except for obvious posterior dissent. No lesions were noted. Packing was placed in her vagina to allow easier access to her trigone. The right ureteral orifice was cannulated with a guidewire which was advanced into the right kidney with fluoroscopic guidance. I then passed a 6 French ureteroscope up the ovary ureter. In the mid ureter, there was a significant stricture. I then dilated this with the ureteral access sheath. The scope was then  advanced to the renal pelvis, without stone seen. Fluoroscopically, there was a calcification in the renal pelvis. I then, once the ureteral access sheath had been replaced, passed a digital flexible ureteroscope into the renal pelvis. The stone was encountered and fragmented with holmium laser. Fragments were then removed with the Nitinol basket through the access sheath. Very tiny fragments were left, these were felt small enough to pass without difficulty. After removal of all large fragments, and no other large fragments seen, the scope was removed. I then passed the guidewire back into the renal pelvis, and removed the access sheath. Cystoscopically, a 24 cm x 6 French contour stent was placed. Good proximal distal curls were seen fluoroscopically and cystoscopically. The bladder was drained, the vaginal packing and the cystoscope removed.  Patient was returned to the PACU in stable condition. She tolerated the procedure well.  She will be discharged on sulfa 1 by mouth twice a day for 3 days as well as oxybutynin for urinary frequency.

## 2012-01-27 NOTE — Anesthesia Preprocedure Evaluation (Signed)
Anesthesia Evaluation  Patient identified by MRN, date of birth, ID band Patient awake    Reviewed: Allergy & Precautions, H&P , NPO status , Patient's Chart, lab work & pertinent test results  Airway Mallampati: II TM Distance: >3 FB Neck ROM: Full    Dental No notable dental hx.    Pulmonary neg pulmonary ROS,  breath sounds clear to auscultation  Pulmonary exam normal       Cardiovascular hypertension, Pt. on medications Rhythm:Regular Rate:Normal     Neuro/Psych negative neurological ROS  negative psych ROS   GI/Hepatic Neg liver ROS, GERD-  Medicated,  Endo/Other  Diabetes mellitus-, Type 2  Renal/GU negative Renal ROS  negative genitourinary   Musculoskeletal negative musculoskeletal ROS (+)   Abdominal   Peds negative pediatric ROS (+)  Hematology negative hematology ROS (+)   Anesthesia Other Findings   Reproductive/Obstetrics negative OB ROS                           Anesthesia Physical Anesthesia Plan  ASA: II  Anesthesia Plan: General   Post-op Pain Management:    Induction: Intravenous  Airway Management Planned: LMA  Additional Equipment:   Intra-op Plan:   Post-operative Plan:   Informed Consent: I have reviewed the patients History and Physical, chart, labs and discussed the procedure including the risks, benefits and alternatives for the proposed anesthesia with the patient or authorized representative who has indicated his/her understanding and acceptance.   Dental advisory given  Plan Discussed with: CRNA  Anesthesia Plan Comments:         Anesthesia Quick Evaluation

## 2012-01-27 NOTE — Discharge Instructions (Addendum)
POSTOPERATIVE CARE AFTER URETEROSCOPY ° °Stent management ° °*Stents are often left in after ureteroscopy and stone treatment. If left in, they often cause urinary frequency, urgency, occasional blood in the urine, as well as flank discomfort with urination. These are all expected issues, and should resolve after the stent is removed. °*Often times, a small thread is left on the end of the stent, and brought out through the urethra. If so, this is used to remove the stent, making it unnecessary to look in the bladder with a scope in the office to remove the stent. If a thread is left on, did not pull on it until instructed. ° °Diet ° °Once you have adequately recovered from anesthesia, you may gradually advance your diet, as tolerated, to your regular diet. ° °Activities ° °You may gradually increase your activities to your normal unrestricted level the day following your procedure. ° °Medications ° °You should resume all preoperative medications. If you are on aspirin-like compounds, you should not resume these until the blood clears from your urine. If given an antibiotic by the surgeon, take these until they are completed. You may also be given, if you have a stent, medications to decrease the urinary frequency and urgency. ° °Pain ° °After ureteroscopy, there may be some pain on the side of the scope. Take your pain medicine for this. Usually, this pain resolves within a day or 2. ° °Fever ° °Please report any fever over 100° to the doctor. ° °

## 2012-01-28 ENCOUNTER — Encounter (HOSPITAL_BASED_OUTPATIENT_CLINIC_OR_DEPARTMENT_OTHER): Payer: Self-pay | Admitting: Urology

## 2012-02-01 ENCOUNTER — Encounter (HOSPITAL_BASED_OUTPATIENT_CLINIC_OR_DEPARTMENT_OTHER): Payer: Self-pay

## 2012-02-17 DIAGNOSIS — N200 Calculus of kidney: Secondary | ICD-10-CM | POA: Diagnosis not present

## 2012-02-17 DIAGNOSIS — R82998 Other abnormal findings in urine: Secondary | ICD-10-CM | POA: Diagnosis not present

## 2012-02-17 DIAGNOSIS — N1330 Unspecified hydronephrosis: Secondary | ICD-10-CM | POA: Diagnosis not present

## 2012-02-23 DIAGNOSIS — E1129 Type 2 diabetes mellitus with other diabetic kidney complication: Secondary | ICD-10-CM | POA: Diagnosis not present

## 2012-02-23 DIAGNOSIS — I10 Essential (primary) hypertension: Secondary | ICD-10-CM | POA: Diagnosis not present

## 2012-02-23 DIAGNOSIS — N182 Chronic kidney disease, stage 2 (mild): Secondary | ICD-10-CM | POA: Diagnosis not present

## 2012-02-23 DIAGNOSIS — M949 Disorder of cartilage, unspecified: Secondary | ICD-10-CM | POA: Diagnosis not present

## 2012-02-23 DIAGNOSIS — M899 Disorder of bone, unspecified: Secondary | ICD-10-CM | POA: Diagnosis not present

## 2012-02-24 DIAGNOSIS — N1330 Unspecified hydronephrosis: Secondary | ICD-10-CM | POA: Diagnosis not present

## 2012-02-24 DIAGNOSIS — N200 Calculus of kidney: Secondary | ICD-10-CM | POA: Diagnosis not present

## 2012-02-24 DIAGNOSIS — R82998 Other abnormal findings in urine: Secondary | ICD-10-CM | POA: Diagnosis not present

## 2012-03-28 DIAGNOSIS — Z87442 Personal history of urinary calculi: Secondary | ICD-10-CM | POA: Diagnosis not present

## 2012-03-28 DIAGNOSIS — R82998 Other abnormal findings in urine: Secondary | ICD-10-CM | POA: Diagnosis not present

## 2012-04-22 DIAGNOSIS — E1129 Type 2 diabetes mellitus with other diabetic kidney complication: Secondary | ICD-10-CM | POA: Diagnosis not present

## 2012-04-22 DIAGNOSIS — I10 Essential (primary) hypertension: Secondary | ICD-10-CM | POA: Diagnosis not present

## 2012-04-22 DIAGNOSIS — E1165 Type 2 diabetes mellitus with hyperglycemia: Secondary | ICD-10-CM | POA: Diagnosis not present

## 2012-06-01 DIAGNOSIS — M159 Polyosteoarthritis, unspecified: Secondary | ICD-10-CM | POA: Diagnosis not present

## 2012-06-01 DIAGNOSIS — M25569 Pain in unspecified knee: Secondary | ICD-10-CM | POA: Diagnosis not present

## 2012-06-01 DIAGNOSIS — H26499 Other secondary cataract, unspecified eye: Secondary | ICD-10-CM | POA: Diagnosis not present

## 2012-06-01 DIAGNOSIS — E119 Type 2 diabetes mellitus without complications: Secondary | ICD-10-CM | POA: Diagnosis not present

## 2012-06-01 DIAGNOSIS — IMO0002 Reserved for concepts with insufficient information to code with codable children: Secondary | ICD-10-CM | POA: Diagnosis not present

## 2012-06-01 DIAGNOSIS — M25519 Pain in unspecified shoulder: Secondary | ICD-10-CM | POA: Diagnosis not present

## 2012-06-23 DIAGNOSIS — E1129 Type 2 diabetes mellitus with other diabetic kidney complication: Secondary | ICD-10-CM | POA: Diagnosis not present

## 2012-06-23 DIAGNOSIS — I10 Essential (primary) hypertension: Secondary | ICD-10-CM | POA: Diagnosis not present

## 2012-06-23 DIAGNOSIS — M899 Disorder of bone, unspecified: Secondary | ICD-10-CM | POA: Diagnosis not present

## 2012-06-30 DIAGNOSIS — M949 Disorder of cartilage, unspecified: Secondary | ICD-10-CM | POA: Diagnosis not present

## 2012-06-30 DIAGNOSIS — M899 Disorder of bone, unspecified: Secondary | ICD-10-CM | POA: Diagnosis not present

## 2012-06-30 DIAGNOSIS — I10 Essential (primary) hypertension: Secondary | ICD-10-CM | POA: Diagnosis not present

## 2012-06-30 DIAGNOSIS — N182 Chronic kidney disease, stage 2 (mild): Secondary | ICD-10-CM | POA: Diagnosis not present

## 2012-06-30 DIAGNOSIS — E1129 Type 2 diabetes mellitus with other diabetic kidney complication: Secondary | ICD-10-CM | POA: Diagnosis not present

## 2012-08-23 DIAGNOSIS — E1129 Type 2 diabetes mellitus with other diabetic kidney complication: Secondary | ICD-10-CM | POA: Diagnosis not present

## 2012-08-23 DIAGNOSIS — Z23 Encounter for immunization: Secondary | ICD-10-CM | POA: Diagnosis not present

## 2012-08-23 DIAGNOSIS — I10 Essential (primary) hypertension: Secondary | ICD-10-CM | POA: Diagnosis not present

## 2012-12-01 DIAGNOSIS — H35379 Puckering of macula, unspecified eye: Secondary | ICD-10-CM | POA: Diagnosis not present

## 2012-12-01 DIAGNOSIS — H26499 Other secondary cataract, unspecified eye: Secondary | ICD-10-CM | POA: Diagnosis not present

## 2012-12-06 DIAGNOSIS — IMO0002 Reserved for concepts with insufficient information to code with codable children: Secondary | ICD-10-CM | POA: Diagnosis not present

## 2012-12-06 DIAGNOSIS — M25519 Pain in unspecified shoulder: Secondary | ICD-10-CM | POA: Diagnosis not present

## 2012-12-06 DIAGNOSIS — M159 Polyosteoarthritis, unspecified: Secondary | ICD-10-CM | POA: Diagnosis not present

## 2012-12-27 DIAGNOSIS — Z1289 Encounter for screening for malignant neoplasm of other sites: Secondary | ICD-10-CM | POA: Diagnosis not present

## 2012-12-27 DIAGNOSIS — Z0000 Encounter for general adult medical examination without abnormal findings: Secondary | ICD-10-CM | POA: Diagnosis not present

## 2012-12-27 DIAGNOSIS — M949 Disorder of cartilage, unspecified: Secondary | ICD-10-CM | POA: Diagnosis not present

## 2012-12-27 DIAGNOSIS — E1165 Type 2 diabetes mellitus with hyperglycemia: Secondary | ICD-10-CM | POA: Diagnosis not present

## 2012-12-27 DIAGNOSIS — N390 Urinary tract infection, site not specified: Secondary | ICD-10-CM | POA: Diagnosis not present

## 2012-12-27 DIAGNOSIS — I10 Essential (primary) hypertension: Secondary | ICD-10-CM | POA: Diagnosis not present

## 2012-12-27 DIAGNOSIS — Z1231 Encounter for screening mammogram for malignant neoplasm of breast: Secondary | ICD-10-CM | POA: Diagnosis not present

## 2012-12-27 DIAGNOSIS — J019 Acute sinusitis, unspecified: Secondary | ICD-10-CM | POA: Diagnosis not present

## 2012-12-27 DIAGNOSIS — M899 Disorder of bone, unspecified: Secondary | ICD-10-CM | POA: Diagnosis not present

## 2012-12-27 DIAGNOSIS — M1990 Unspecified osteoarthritis, unspecified site: Secondary | ICD-10-CM | POA: Diagnosis not present

## 2012-12-27 DIAGNOSIS — E1129 Type 2 diabetes mellitus with other diabetic kidney complication: Secondary | ICD-10-CM | POA: Diagnosis not present

## 2012-12-27 DIAGNOSIS — Z1331 Encounter for screening for depression: Secondary | ICD-10-CM | POA: Diagnosis not present

## 2012-12-28 DIAGNOSIS — Z96659 Presence of unspecified artificial knee joint: Secondary | ICD-10-CM | POA: Diagnosis not present

## 2012-12-28 DIAGNOSIS — I10 Essential (primary) hypertension: Secondary | ICD-10-CM | POA: Diagnosis not present

## 2012-12-28 DIAGNOSIS — K219 Gastro-esophageal reflux disease without esophagitis: Secondary | ICD-10-CM | POA: Diagnosis not present

## 2012-12-28 DIAGNOSIS — IMO0001 Reserved for inherently not codable concepts without codable children: Secondary | ICD-10-CM | POA: Diagnosis not present

## 2013-01-03 DIAGNOSIS — K219 Gastro-esophageal reflux disease without esophagitis: Secondary | ICD-10-CM | POA: Diagnosis not present

## 2013-01-03 DIAGNOSIS — N182 Chronic kidney disease, stage 2 (mild): Secondary | ICD-10-CM | POA: Diagnosis not present

## 2013-01-03 DIAGNOSIS — I10 Essential (primary) hypertension: Secondary | ICD-10-CM | POA: Diagnosis not present

## 2013-01-03 DIAGNOSIS — E1129 Type 2 diabetes mellitus with other diabetic kidney complication: Secondary | ICD-10-CM | POA: Diagnosis not present

## 2013-01-04 DIAGNOSIS — Z1211 Encounter for screening for malignant neoplasm of colon: Secondary | ICD-10-CM | POA: Diagnosis not present

## 2013-01-04 DIAGNOSIS — M1710 Unilateral primary osteoarthritis, unspecified knee: Secondary | ICD-10-CM | POA: Diagnosis not present

## 2013-01-12 ENCOUNTER — Encounter (HOSPITAL_COMMUNITY): Payer: Self-pay | Admitting: Pharmacy Technician

## 2013-01-12 ENCOUNTER — Other Ambulatory Visit: Payer: Self-pay | Admitting: Orthopedic Surgery

## 2013-01-16 ENCOUNTER — Encounter (HOSPITAL_COMMUNITY): Payer: Self-pay

## 2013-01-16 ENCOUNTER — Encounter (HOSPITAL_COMMUNITY)
Admission: RE | Admit: 2013-01-16 | Discharge: 2013-01-16 | Disposition: A | Discharge status: Home / Self Care | Payer: Medicare Other | Source: Ambulatory Visit | Attending: Orthopedic Surgery | Admitting: Orthopedic Surgery

## 2013-01-16 ENCOUNTER — Ambulatory Visit (HOSPITAL_COMMUNITY)
Admission: RE | Admit: 2013-01-16 | Discharge: 2013-01-16 | Disposition: A | Discharge status: Home / Self Care | Payer: Medicare Other | Source: Ambulatory Visit | Attending: Orthopedic Surgery | Admitting: Orthopedic Surgery

## 2013-01-16 DIAGNOSIS — K219 Gastro-esophageal reflux disease without esophagitis: Secondary | ICD-10-CM | POA: Diagnosis not present

## 2013-01-16 DIAGNOSIS — M1710 Unilateral primary osteoarthritis, unspecified knee: Secondary | ICD-10-CM | POA: Diagnosis not present

## 2013-01-16 DIAGNOSIS — IMO0002 Reserved for concepts with insufficient information to code with codable children: Secondary | ICD-10-CM | POA: Diagnosis not present

## 2013-01-16 DIAGNOSIS — I10 Essential (primary) hypertension: Secondary | ICD-10-CM | POA: Diagnosis not present

## 2013-01-16 DIAGNOSIS — H409 Unspecified glaucoma: Secondary | ICD-10-CM | POA: Diagnosis present

## 2013-01-16 DIAGNOSIS — Z01812 Encounter for preprocedural laboratory examination: Secondary | ICD-10-CM | POA: Diagnosis not present

## 2013-01-16 DIAGNOSIS — Z96659 Presence of unspecified artificial knee joint: Secondary | ICD-10-CM | POA: Diagnosis not present

## 2013-01-16 DIAGNOSIS — Z01818 Encounter for other preprocedural examination: Secondary | ICD-10-CM | POA: Diagnosis not present

## 2013-01-16 DIAGNOSIS — Z79899 Other long term (current) drug therapy: Secondary | ICD-10-CM | POA: Diagnosis not present

## 2013-01-16 DIAGNOSIS — Z7901 Long term (current) use of anticoagulants: Secondary | ICD-10-CM | POA: Diagnosis not present

## 2013-01-16 DIAGNOSIS — E119 Type 2 diabetes mellitus without complications: Secondary | ICD-10-CM | POA: Diagnosis not present

## 2013-01-16 DIAGNOSIS — Z7982 Long term (current) use of aspirin: Secondary | ICD-10-CM | POA: Diagnosis not present

## 2013-01-16 DIAGNOSIS — M25469 Effusion, unspecified knee: Secondary | ICD-10-CM | POA: Diagnosis not present

## 2013-01-16 DIAGNOSIS — Z5189 Encounter for other specified aftercare: Secondary | ICD-10-CM | POA: Diagnosis not present

## 2013-01-16 DIAGNOSIS — Z01811 Encounter for preprocedural respiratory examination: Secondary | ICD-10-CM | POA: Diagnosis not present

## 2013-01-16 HISTORY — DX: Type 2 diabetes mellitus without complications: E11.9

## 2013-01-16 LAB — BASIC METABOLIC PANEL
BUN: 16 mg/dL (ref 6–23)
CO2: 30 mEq/L (ref 19–32)
Calcium: 9.5 mg/dL (ref 8.4–10.5)
Chloride: 103 mEq/L (ref 96–112)
Creatinine, Ser: 0.83 mg/dL (ref 0.50–1.10)
GFR calc Af Amer: 79 mL/min — ABNORMAL LOW (ref >90)
GFR calc non Af Amer: 68 mL/min — ABNORMAL LOW (ref >90)
Glucose, Bld: 128 mg/dL — ABNORMAL HIGH (ref 70–99)
Potassium: 4.2 mEq/L (ref 3.5–5.1)
Sodium: 140 mEq/L (ref 135–145)

## 2013-01-16 LAB — URINALYSIS, ROUTINE W REFLEX MICROSCOPIC
Bilirubin Urine: NEGATIVE
Glucose, UA: NEGATIVE mg/dL
Hgb urine dipstick: NEGATIVE
Ketones, ur: NEGATIVE mg/dL
Nitrite: NEGATIVE
Protein, ur: NEGATIVE mg/dL
Specific Gravity, Urine: 1.024 (ref 1.005–1.030)
Urobilinogen, UA: 0.2 mg/dL (ref 0.0–1.0)
pH: 5 (ref 5.0–8.0)

## 2013-01-16 LAB — CBC
HCT: 39.6 % (ref 36.0–46.0)
Hemoglobin: 13.1 g/dL (ref 12.0–15.0)
MCH: 30.6 pg (ref 26.0–34.0)
MCHC: 33.1 g/dL (ref 30.0–36.0)
MCV: 92.5 fL (ref 78.0–100.0)
Platelets: 394 10*3/uL (ref 150–400)
RBC: 4.28 MIL/uL (ref 3.87–5.11)
RDW: 17 % — ABNORMAL HIGH (ref 11.5–15.5)
WBC: 11 10*3/uL — ABNORMAL HIGH (ref 4.0–10.5)

## 2013-01-16 LAB — URINE MICROSCOPIC-ADD ON

## 2013-01-16 LAB — TYPE AND SCREEN
ABO/RH(D): B POS
Antibody Screen: NEGATIVE

## 2013-01-16 LAB — SURGICAL PCR SCREEN
MRSA, PCR: NEGATIVE
Staphylococcus aureus: NEGATIVE

## 2013-01-16 LAB — PROTIME-INR
INR: 1.06 (ref 0.00–1.49)
Prothrombin Time: 13.7 seconds (ref 11.6–15.2)

## 2013-01-16 LAB — APTT: aPTT: 35 seconds (ref 24–37)

## 2013-01-16 LAB — ABO/RH: ABO/RH(D): B POS

## 2013-01-16 NOTE — H&P (Signed)
TOTAL KNEE ADMISSION H&P  Patient is being admitted for left total knee arthroplasty.  Subjective:  Chief Complaint:left knee pain.  HPI: Dorothy Anderson, 72 y.o. female, has a history of pain and functional disability in the left knee due to arthritis and has failed non-surgical conservative treatments for greater than 12 weeks to includeNSAID's and/or analgesics, corticosteriod injections, use of assistive devices, weight reduction as appropriate and activity modification.  Onset of symptoms was gradual, starting 10 years ago with gradually worsening course since that time. The patient noted no past surgery on the left knee(s).  Patient currently rates pain in the left knee(s) at 7 out of 10 with activity. Patient has night pain, worsening of pain with activity and weight bearing, pain that interferes with activities of daily living, crepitus and joint swelling.  Patient has evidence of subchondral sclerosis and joint space narrowing by imaging studies. This patient has had significant and progressive pain which is unresponsive to non op treatment.. There is no active infection.  There are no active problems to display for this patient.  Past Medical History  Diagnosis Date  . Hypertension   . GERD (gastroesophageal reflux disease)   . History of kidney stones   . Hyperglycemia   . Nocturia   . Frequency of urination   . Glaucoma(365) BILATERAL EYES  . Wears glasses   . Complication of anesthesia     slow to wake up  . Diabetes mellitus without complication     PRE DIABETIC ORAL MEDS   . Hydronephrosis, left     DR. STEVEN DAHLSTADT  . Calculus of left kidney   . Arthritis KNEES    OA    Past Surgical History  Procedure Laterality Date  . Total knee arthroplasty  09-29-2005    RIGHT  . Carpal tunnel release  06-17-2005    RIGHT  . Cysto/ left retrograde pyelogram/ left ureteral stent placement  04-15-2005    URETERAL STONE  . Cataract extraction w/ intraocular lens  implant,  bilateral    . Extracorporeal shock wave lithotripsy  11-09-11    RIGHT  . Tonsillectomy and adenoidectomy  AGE 25  . Cystoscopy/retrograde/ureteroscopy  01/27/2012    Procedure: CYSTOSCOPY/RETROGRADE/URETEROSCOPY;  Surgeon: Marcine Matar, MD;  Location: [facility];  Service: Urology;  Laterality: Right;    No prescriptions prior to admission   Allergies  Allergen Reactions  . Darvon Other (See Comments)    MADE PT STAY AWAKE   . Macrobid (Nitrofurantoin Monohydrate Macrocrystals) Nausea And Vomiting    History  Substance Use Topics  . Smoking status: Never Smoker   . Smokeless tobacco: Never Used  . Alcohol Use: No    No family history on file.   Review of Systems  Constitutional: Negative.   HENT: Negative.   Eyes: Negative.   Respiratory: Negative.   Cardiovascular: Negative.   Gastrointestinal: Negative.   Genitourinary: Negative.   Musculoskeletal: Positive for joint pain.  Skin: Negative.   Neurological: Negative.   Endo/Heme/Allergies: Negative.   Psychiatric/Behavioral: Negative.     Objective:  Physical Exam  Constitutional: She appears well-developed.  HENT:  Head: Normocephalic.  Eyes: Pupils are equal, round, and reactive to light.  Neck: Normal range of motion.  Cardiovascular: Normal rate.   Respiratory: Effort normal.  GI: Soft.  Neurological: She is alert.  Skin: Skin is warm.  left knee skin ok - df/pf intact - dp 1 / 4 - rom 7 - 105 - stable to varus/valgus stress  Vital signs in last 24 hours: Temp:  [98.7 F (37.1 C)] 98.7 F (37.1 C) (03/10 1033) Pulse Rate:  [79] 79 (03/10 1033) Resp:  [20] 20 (03/10 1033) BP: (138)/(72) 138/72 mmHg (03/10 1033) SpO2:  [96 %] 96 % (03/10 1033) Weight:  [91.264 kg (201 lb 3.2 oz)] 91.264 kg (201 lb 3.2 oz) (03/10 1033)  Labs:   Estimated body mass index is 34.87 kg/(m^2) as calculated from the following:   Height as of 01/21/12: 5' 3.5" (1.613 m).   Weight as of 01/27/12: 90.719  kg (200 lb).   Imaging Review Plain radiographs demonstrate severe degenerative joint disease of the left knee(s). The overall alignment ismild varus. The bone quality appears to be good for age and reported activity level.  Assessment/Plan:  End stage arthritis, left knee   The patient history, physical examination, clinical judgment of the provider and imaging studies are consistent with end stage degenerative joint disease of the left knee(s) and total knee arthroplasty is deemed medically necessary. The treatment options including medical management, injection therapy arthroscopy and arthroplasty were discussed at length. The risks and benefits of total knee arthroplasty were presented and reviewed. The risks due to aseptic loosening, infection, stiffness, patella tracking problems, thromboembolic complications and other imponderables were discussed. The patient acknowledged the explanation, agreed to proceed with the plan and consent was signed. Patient is being admitted for inpatient treatment for surgery, pain control, PT, OT, prophylactic antibiotics, VTE prophylaxis, progressive ambulation and ADL's and discharge planning. The patient is planning to be discharged home with home health services

## 2013-01-16 NOTE — Pre-Procedure Instructions (Signed)
Robie Oats Polak  01/16/2013   Your procedure is scheduled on: Tuesday 01/17/13    Report to [facility] at 900 AM.  Call this number if you have problems the morning of surgery: [phone]   Remember:   Do not eat food or drink liquids after midnight.   Take these medicines the morning of surgery with A SIP OF WATER: VICODIN IF NEEDED, PREVACID, EYE DROPS, METOPROLOL(LOPRESSOR)   Do not wear jewelry, make-up or nail polish.  Do not wear lotions, powders, or perfumes. You may wear deodorant.  Do not shave 48 hours prior to surgery. Men may shave face and neck.  Do not bring valuables to the hospital.  Contacts, dentures or bridgework may not be worn into surgery.  Leave suitcase in the car. After surgery it may be brought to your room.  For patients admitted to the hospital, checkout time is 11:00 AM the day of  discharge.   Patients discharged the day of surgery will not be allowed to drive  home.  Name and phone number of your driver:   Special Instructions: Shower using CHG 2 nights before surgery and the night before surgery.  If you shower the day of surgery use CHG.  Use special wash - you have one bottle of CHG for all showers.  You should use approximately 1/3 of the bottle for each shower.   Please read over the following fact sheets that you were given: Pain Booklet, Coughing and Deep Breathing, Blood Transfusion Information, Total Joint Packet, MRSA Information and Surgical Site Infection Prevention

## 2013-01-16 NOTE — Progress Notes (Signed)
01/16/13 1044  OBSTRUCTIVE SLEEP APNEA  Have you ever been diagnosed with sleep apnea through a sleep study? No  Do you snore loudly (loud enough to be heard through closed doors)?  0  Do you often feel tired, fatigued, or sleepy during the daytime? 1  Has anyone observed you stop breathing during your sleep? 1  Do you have, or are you being treated for high blood pressure? 1  BMI more than 35 kg/m2? 0  Age over 73 years old? 1  Neck circumference greater than 40 cm/18 inches? 0  Gender: 0  Obstructive Sleep Apnea Score 4  Score 4 or greater  Results sent to PCP

## 2013-01-17 ENCOUNTER — Encounter (HOSPITAL_COMMUNITY)
Admission: RE | Disposition: A | Discharge status: Skilled Nursing Facility | Payer: Self-pay | Source: Ambulatory Visit | Attending: Orthopedic Surgery

## 2013-01-17 ENCOUNTER — Inpatient Hospital Stay (HOSPITAL_COMMUNITY): Payer: Medicare Other | Admitting: Certified Registered"

## 2013-01-17 ENCOUNTER — Inpatient Hospital Stay (HOSPITAL_COMMUNITY)
Admission: RE | Admit: 2013-01-17 | Discharge: 2013-01-21 | DRG: 470 | Disposition: A | Discharge status: Skilled Nursing Facility | Payer: Medicare Other | Source: Ambulatory Visit | Attending: Orthopedic Surgery | Admitting: Orthopedic Surgery

## 2013-01-17 ENCOUNTER — Encounter (HOSPITAL_COMMUNITY): Payer: Self-pay | Admitting: Certified Registered"

## 2013-01-17 DIAGNOSIS — M1712 Unilateral primary osteoarthritis, left knee: Secondary | ICD-10-CM | POA: Diagnosis present

## 2013-01-17 DIAGNOSIS — K219 Gastro-esophageal reflux disease without esophagitis: Secondary | ICD-10-CM | POA: Diagnosis present

## 2013-01-17 DIAGNOSIS — Z96659 Presence of unspecified artificial knee joint: Secondary | ICD-10-CM

## 2013-01-17 DIAGNOSIS — I10 Essential (primary) hypertension: Secondary | ICD-10-CM | POA: Diagnosis present

## 2013-01-17 DIAGNOSIS — IMO0002 Reserved for concepts with insufficient information to code with codable children: Secondary | ICD-10-CM | POA: Insufficient documentation

## 2013-01-17 DIAGNOSIS — Z79899 Other long term (current) drug therapy: Secondary | ICD-10-CM

## 2013-01-17 DIAGNOSIS — Z7901 Long term (current) use of anticoagulants: Secondary | ICD-10-CM

## 2013-01-17 DIAGNOSIS — Z7982 Long term (current) use of aspirin: Secondary | ICD-10-CM

## 2013-01-17 DIAGNOSIS — H409 Unspecified glaucoma: Secondary | ICD-10-CM | POA: Diagnosis present

## 2013-01-17 DIAGNOSIS — E119 Type 2 diabetes mellitus without complications: Secondary | ICD-10-CM | POA: Diagnosis present

## 2013-01-17 DIAGNOSIS — Z01812 Encounter for preprocedural laboratory examination: Secondary | ICD-10-CM

## 2013-01-17 DIAGNOSIS — M1710 Unilateral primary osteoarthritis, unspecified knee: Principal | ICD-10-CM | POA: Diagnosis present

## 2013-01-17 HISTORY — PX: TOTAL KNEE ARTHROPLASTY: SHX125

## 2013-01-17 LAB — GLUCOSE, CAPILLARY
Glucose-Capillary: 114 mg/dL — ABNORMAL HIGH (ref 70–99)
Glucose-Capillary: 138 mg/dL — ABNORMAL HIGH (ref 70–99)
Glucose-Capillary: 145 mg/dL — ABNORMAL HIGH (ref 70–99)

## 2013-01-17 LAB — URINE CULTURE
Colony Count: NO GROWTH
Culture: NO GROWTH

## 2013-01-17 SURGERY — ARTHROPLASTY, KNEE, TOTAL
Anesthesia: General | Site: Knee | Laterality: Left | Wound class: Clean

## 2013-01-17 MED ORDER — HYDROMORPHONE HCL PF 1 MG/ML IJ SOLN
INTRAMUSCULAR | Status: AC
  Filled 2013-01-17: qty 1

## 2013-01-17 MED ORDER — LATANOPROST 0.005 % OP SOLN
1.0000 [drp] | Freq: Every day | OPHTHALMIC | Status: DC
  Administered 2013-01-17 – 2013-01-19 (×2): 1 [drp] via OPHTHALMIC
  Filled 2013-01-17 (×2): qty 2.5

## 2013-01-17 MED ORDER — COUMADIN BOOK
Freq: Once | Status: AC
  Administered 2013-01-18: 14:00:00
  Filled 2013-01-17: qty 1

## 2013-01-17 MED ORDER — ONDANSETRON HCL 4 MG/2ML IJ SOLN: 4.0000 mg | Freq: Four times a day (QID) | INTRAMUSCULAR | Status: DC | PRN

## 2013-01-17 MED ORDER — METHOCARBAMOL 100 MG/ML IJ SOLN
500.0000 mg | Freq: Four times a day (QID) | INTRAVENOUS | Status: DC | PRN
  Filled 2013-01-17: qty 5

## 2013-01-17 MED ORDER — SODIUM CHLORIDE 0.9 % IJ SOLN: 9.0000 mL | INTRAMUSCULAR | Status: DC | PRN

## 2013-01-17 MED ORDER — CEFAZOLIN SODIUM-DEXTROSE 2-3 GM-% IV SOLR
2.0000 g | Freq: Three times a day (TID) | INTRAVENOUS | Status: AC
  Administered 2013-01-17 – 2013-01-18 (×2): 2 g via INTRAVENOUS
  Filled 2013-01-17 (×2): qty 50

## 2013-01-17 MED ORDER — VITAMIN D3 25 MCG (1000 UNIT) PO TABS
1000.0000 [IU] | ORAL_TABLET | Freq: Every day | ORAL | Status: DC
  Administered 2013-01-18 – 2013-01-21 (×4): 1000 [IU] via ORAL
  Filled 2013-01-17 (×4): qty 1

## 2013-01-17 MED ORDER — WARFARIN - PHARMACIST DOSING INPATIENT: Freq: Every day | Status: DC

## 2013-01-17 MED ORDER — DIPHENHYDRAMINE HCL 12.5 MG/5ML PO ELIX: 12.5000 mg | ORAL_SOLUTION | Freq: Four times a day (QID) | ORAL | Status: DC | PRN

## 2013-01-17 MED ORDER — MORPHINE SULFATE 4 MG/ML IJ SOLN
INTRAMUSCULAR | Status: DC | PRN
  Administered 2013-01-17: 8 mg via INTRAVENOUS

## 2013-01-17 MED ORDER — WARFARIN SODIUM 5 MG PO TABS
5.0000 mg | ORAL_TABLET | Freq: Once | ORAL | Status: AC
  Administered 2013-01-17: 5 mg via ORAL
  Filled 2013-01-17: qty 1

## 2013-01-17 MED ORDER — MENTHOL 3 MG MT LOZG: 1.0000 | LOZENGE | OROMUCOSAL | Status: DC | PRN

## 2013-01-17 MED ORDER — CEFAZOLIN SODIUM-DEXTROSE 2-3 GM-% IV SOLR
INTRAVENOUS | Status: AC
  Filled 2013-01-17: qty 50

## 2013-01-17 MED ORDER — THERA M PLUS PO TABS: 1.0000 | ORAL_TABLET | Freq: Every day | ORAL | Status: DC

## 2013-01-17 MED ORDER — CHLORHEXIDINE GLUCONATE 4 % EX LIQD: 60.0000 mL | Freq: Once | CUTANEOUS | Status: DC

## 2013-01-17 MED ORDER — METOPROLOL TARTRATE 25 MG PO TABS
25.0000 mg | ORAL_TABLET | Freq: Two times a day (BID) | ORAL | Status: DC
  Administered 2013-01-17 – 2013-01-21 (×8): 25 mg via ORAL
  Filled 2013-01-17 (×9): qty 1

## 2013-01-17 MED ORDER — NALOXONE HCL 0.4 MG/ML IJ SOLN
0.4000 mg | INTRAMUSCULAR | Status: DC | PRN
  Filled 2013-01-17: qty 1

## 2013-01-17 MED ORDER — METOCLOPRAMIDE HCL 10 MG PO TABS: 5.0000 mg | ORAL_TABLET | Freq: Three times a day (TID) | ORAL | Status: DC | PRN

## 2013-01-17 MED ORDER — PHENOL 1.4 % MT LIQD: 1.0000 | OROMUCOSAL | Status: DC | PRN

## 2013-01-17 MED ORDER — PANTOPRAZOLE SODIUM 20 MG PO TBEC
20.0000 mg | DELAYED_RELEASE_TABLET | Freq: Every day | ORAL | Status: DC
  Administered 2013-01-18 – 2013-01-21 (×4): 20 mg via ORAL
  Filled 2013-01-17 (×4): qty 1

## 2013-01-17 MED ORDER — OXYCODONE HCL 5 MG/5ML PO SOLN: 5.0000 mg | Freq: Once | ORAL | Status: DC | PRN

## 2013-01-17 MED ORDER — MIDAZOLAM HCL 5 MG/5ML IJ SOLN
INTRAMUSCULAR | Status: DC | PRN
  Administered 2013-01-17: 2 mg via INTRAVENOUS

## 2013-01-17 MED ORDER — ACETAMINOPHEN 650 MG RE SUPP: 650.0000 mg | Freq: Four times a day (QID) | RECTAL | Status: DC | PRN

## 2013-01-17 MED ORDER — MORPHINE SULFATE 4 MG/ML IJ SOLN
INTRAMUSCULAR | Status: AC
  Filled 2013-01-17: qty 2

## 2013-01-17 MED ORDER — MORPHINE SULFATE (PF) 1 MG/ML IV SOLN
INTRAVENOUS | Status: DC
  Administered 2013-01-17: 2 mg via INTRAVENOUS
  Administered 2013-01-17: 5 mg via INTRAVENOUS
  Administered 2013-01-17: 14:00:00 via INTRAVENOUS
  Administered 2013-01-18: 1 mg via INTRAVENOUS
  Administered 2013-01-18: 10 mg via INTRAVENOUS
  Administered 2013-01-18: 14 mg via INTRAVENOUS
  Administered 2013-01-18: 04:00:00 via INTRAVENOUS
  Filled 2013-01-17: qty 25

## 2013-01-17 MED ORDER — HYDROMORPHONE HCL PF 1 MG/ML IJ SOLN
0.2500 mg | INTRAMUSCULAR | Status: DC | PRN
  Administered 2013-01-17 (×4): 0.5 mg via INTRAVENOUS

## 2013-01-17 MED ORDER — WARFARIN VIDEO
Freq: Once | Status: AC
  Administered 2013-01-18: 14:00:00

## 2013-01-17 MED ORDER — DOCUSATE SODIUM 100 MG PO CAPS
100.0000 mg | ORAL_CAPSULE | Freq: Two times a day (BID) | ORAL | Status: DC
  Administered 2013-01-17 – 2013-01-21 (×8): 100 mg via ORAL
  Filled 2013-01-17 (×8): qty 1

## 2013-01-17 MED ORDER — MORPHINE SULFATE (PF) 1 MG/ML IV SOLN
INTRAVENOUS | Status: AC
  Filled 2013-01-17: qty 25

## 2013-01-17 MED ORDER — ONDANSETRON HCL 4 MG/2ML IJ SOLN
INTRAMUSCULAR | Status: DC | PRN
  Administered 2013-01-17: 4 mg via INTRAVENOUS

## 2013-01-17 MED ORDER — ONDANSETRON HCL 4 MG PO TABS: 4.0000 mg | ORAL_TABLET | Freq: Four times a day (QID) | ORAL | Status: DC | PRN

## 2013-01-17 MED ORDER — MORPHINE SULFATE 2 MG/ML IJ SOLN
2.0000 mg | INTRAMUSCULAR | Status: DC | PRN
  Administered 2013-01-18 – 2013-01-19 (×3): 2 mg via INTRAVENOUS
  Filled 2013-01-17 (×3): qty 1

## 2013-01-17 MED ORDER — METOCLOPRAMIDE HCL 5 MG/ML IJ SOLN: 5.0000 mg | Freq: Three times a day (TID) | INTRAMUSCULAR | Status: DC | PRN

## 2013-01-17 MED ORDER — ACETAMINOPHEN 10 MG/ML IV SOLN
INTRAVENOUS | Status: AC
  Administered 2013-01-17: 1000 mg via INTRAVENOUS
  Filled 2013-01-17: qty 100

## 2013-01-17 MED ORDER — METFORMIN HCL ER 500 MG PO TB24
500.0000 mg | ORAL_TABLET | Freq: Every day | ORAL | Status: DC
  Administered 2013-01-18 – 2013-01-21 (×4): 500 mg via ORAL
  Filled 2013-01-17 (×5): qty 1

## 2013-01-17 MED ORDER — PROPOFOL 10 MG/ML IV BOLUS
INTRAVENOUS | Status: DC | PRN
  Administered 2013-01-17: 160 mg via INTRAVENOUS

## 2013-01-17 MED ORDER — BUPIVACAINE-EPINEPHRINE 0.5% -1:200000 IJ SOLN
INTRAMUSCULAR | Status: DC | PRN
  Administered 2013-01-17: 20 mL

## 2013-01-17 MED ORDER — POTASSIUM CHLORIDE IN NACL 20-0.9 MEQ/L-% IV SOLN
INTRAVENOUS | Status: AC
  Administered 2013-01-18 (×2): via INTRAVENOUS
  Filled 2013-01-17 (×3): qty 1000

## 2013-01-17 MED ORDER — LIDOCAINE HCL (CARDIAC) 20 MG/ML IV SOLN
INTRAVENOUS | Status: DC | PRN
  Administered 2013-01-17: 50 mg via INTRAVENOUS

## 2013-01-17 MED ORDER — CEFAZOLIN SODIUM-DEXTROSE 2-3 GM-% IV SOLR
2.0000 g | INTRAVENOUS | Status: AC
  Administered 2013-01-17: 2 g via INTRAVENOUS

## 2013-01-17 MED ORDER — LACTATED RINGERS IV SOLN
INTRAVENOUS | Status: DC
  Administered 2013-01-17 (×2): via INTRAVENOUS

## 2013-01-17 MED ORDER — CALCIUM CARBONATE-VITAMIN D 500-200 MG-UNIT PO TABS
1.0000 | ORAL_TABLET | Freq: Every day | ORAL | Status: DC
  Administered 2013-01-17 – 2013-01-21 (×5): 1 via ORAL
  Filled 2013-01-17 (×5): qty 1

## 2013-01-17 MED ORDER — CLONIDINE HCL (ANALGESIA) 100 MCG/ML EP SOLN
150.0000 ug | EPIDURAL | Status: DC
  Filled 2013-01-17: qty 1.5

## 2013-01-17 MED ORDER — MEPERIDINE HCL 25 MG/ML IJ SOLN: 6.2500 mg | INTRAMUSCULAR | Status: DC | PRN

## 2013-01-17 MED ORDER — ACETAMINOPHEN 325 MG PO TABS
650.0000 mg | ORAL_TABLET | Freq: Four times a day (QID) | ORAL | Status: DC | PRN
  Administered 2013-01-19: 650 mg via ORAL
  Filled 2013-01-17: qty 2

## 2013-01-17 MED ORDER — OXYCODONE HCL 5 MG PO TABS: 5.0000 mg | ORAL_TABLET | Freq: Once | ORAL | Status: DC | PRN

## 2013-01-17 MED ORDER — DIPHENHYDRAMINE HCL 50 MG/ML IJ SOLN: 12.5000 mg | Freq: Four times a day (QID) | INTRAMUSCULAR | Status: DC | PRN

## 2013-01-17 MED ORDER — ADULT MULTIVITAMIN W/MINERALS CH
1.0000 | ORAL_TABLET | Freq: Every day | ORAL | Status: DC
  Administered 2013-01-18 – 2013-01-21 (×4): 1 via ORAL
  Filled 2013-01-17 (×4): qty 1

## 2013-01-17 MED ORDER — OXYCODONE HCL 5 MG PO TABS
5.0000 mg | ORAL_TABLET | ORAL | Status: DC | PRN
  Administered 2013-01-18 (×2): 10 mg via ORAL
  Filled 2013-01-17 (×2): qty 2

## 2013-01-17 MED ORDER — INSULIN ASPART 100 UNIT/ML ~~LOC~~ SOLN
0.0000 [IU] | Freq: Three times a day (TID) | SUBCUTANEOUS | Status: DC
  Administered 2013-01-18 – 2013-01-21 (×7): 2 [IU] via SUBCUTANEOUS
  Administered 2013-01-21: 3 [IU] via SUBCUTANEOUS

## 2013-01-17 MED ORDER — ONDANSETRON HCL 4 MG/2ML IJ SOLN: 4.0000 mg | Freq: Once | INTRAMUSCULAR | Status: DC | PRN

## 2013-01-17 MED ORDER — METHOCARBAMOL 500 MG PO TABS
500.0000 mg | ORAL_TABLET | Freq: Four times a day (QID) | ORAL | Status: DC | PRN
  Administered 2013-01-17 – 2013-01-19 (×5): 500 mg via ORAL
  Filled 2013-01-17 (×5): qty 1

## 2013-01-17 MED ORDER — CALCIUM-VITAMIN D 250-125 MG-UNIT PO TABS: 1.0000 | ORAL_TABLET | Freq: Three times a day (TID) | ORAL | Status: DC

## 2013-01-17 MED ORDER — CLONIDINE HCL (ANALGESIA) 100 MCG/ML EP SOLN
EPIDURAL | Status: DC | PRN
  Administered 2013-01-17: 1 mL

## 2013-01-17 MED ORDER — FENTANYL CITRATE 0.05 MG/ML IJ SOLN
INTRAMUSCULAR | Status: DC | PRN
  Administered 2013-01-17: 25 ug via INTRAVENOUS
  Administered 2013-01-17: 100 ug via INTRAVENOUS
  Administered 2013-01-17: 50 ug via INTRAVENOUS
  Administered 2013-01-17: 25 ug via INTRAVENOUS
  Administered 2013-01-17: 125 ug via INTRAVENOUS
  Administered 2013-01-17: 100 ug via INTRAVENOUS
  Administered 2013-01-17: 25 ug via INTRAVENOUS
  Administered 2013-01-17: 100 ug via INTRAVENOUS
  Administered 2013-01-17 (×2): 25 ug via INTRAVENOUS

## 2013-01-17 SURGICAL SUPPLY — 71 items
BANDAGE ELASTIC 4 VELCRO ST LF (GAUZE/BANDAGES/DRESSINGS) ×2 IMPLANT
BANDAGE ESMARK 6X9 LF (GAUZE/BANDAGES/DRESSINGS) ×1 IMPLANT
BLADE SAG 18X100X1.27 (BLADE) ×2 IMPLANT
BLADE SAW SGTL 13.0X1.19X90.0M (BLADE) ×2 IMPLANT
BNDG COHESIVE 6X5 TAN STRL LF (GAUZE/BANDAGES/DRESSINGS) ×2 IMPLANT
BNDG ELASTIC 6X10 VLCR STRL LF (GAUZE/BANDAGES/DRESSINGS) ×4 IMPLANT
BNDG ESMARK 6X9 LF (GAUZE/BANDAGES/DRESSINGS) ×2
BOWL SMART MIX CTS (DISPOSABLE) ×2 IMPLANT
CEMENT BONE SIMPLEX SPEEDSET (Cement) ×2 IMPLANT
CLOTH BEACON ORANGE TIMEOUT ST (SAFETY) ×2 IMPLANT
COVER SURGICAL LIGHT HANDLE (MISCELLANEOUS) ×2 IMPLANT
CUFF TOURNIQUET SINGLE 34IN LL (TOURNIQUET CUFF) ×2 IMPLANT
CUFF TOURNIQUET SINGLE 44IN (TOURNIQUET CUFF) IMPLANT
DRAPE INCISE IOBAN 66X45 STRL (DRAPES) IMPLANT
DRAPE ORTHO SPLIT 77X108 STRL (DRAPES) ×3
DRAPE PROXIMA HALF (DRAPES) IMPLANT
DRAPE SURG ORHT 6 SPLT 77X108 (DRAPES) ×3 IMPLANT
DRAPE U-SHAPE 47X51 STRL (DRAPES) ×2 IMPLANT
DRAPE X-RAY CASS 24X20 (DRAPES) IMPLANT
DRSG PAD ABDOMINAL 8X10 ST (GAUZE/BANDAGES/DRESSINGS) ×8 IMPLANT
DURAPREP 26ML APPLICATOR (WOUND CARE) ×2 IMPLANT
ELECT REM PT RETURN 9FT ADLT (ELECTROSURGICAL) ×2
ELECTRODE REM PT RTRN 9FT ADLT (ELECTROSURGICAL) ×1 IMPLANT
EVACUATOR 1/8 PVC DRAIN (DRAIN) IMPLANT
FACESHIELD LNG OPTICON STERILE (SAFETY) IMPLANT
GAUZE XEROFORM 5X9 LF (GAUZE/BANDAGES/DRESSINGS) ×2 IMPLANT
GLOVE BIO SURGEON ST LM GN SZ9 (GLOVE) IMPLANT
GLOVE BIOGEL PI IND STRL 8 (GLOVE) ×1 IMPLANT
GLOVE BIOGEL PI INDICATOR 8 (GLOVE) ×1
GLOVE SURG ORTHO 8.0 STRL STRW (GLOVE) ×2 IMPLANT
GOWN PREVENTION PLUS LG XLONG (DISPOSABLE) ×4 IMPLANT
GOWN PREVENTION PLUS XLARGE (GOWN DISPOSABLE) IMPLANT
GOWN STRL NON-REIN LRG LVL3 (GOWN DISPOSABLE) ×4 IMPLANT
HANDPIECE INTERPULSE COAX TIP (DISPOSABLE) ×1
HOOD PEEL AWAY FACE SHEILD DIS (HOOD) ×8 IMPLANT
IMMOBILIZER KNEE 20 (SOFTGOODS)
IMMOBILIZER KNEE 20 THIGH 36 (SOFTGOODS) IMPLANT
IMMOBILIZER KNEE 22 UNIV (SOFTGOODS) IMPLANT
IMMOBILIZER KNEE 24 THIGH 36 (MISCELLANEOUS) IMPLANT
IMMOBILIZER KNEE 24 UNIV (MISCELLANEOUS)
KIT BASIN OR (CUSTOM PROCEDURE TRAY) ×2 IMPLANT
KIT ROOM TURNOVER OR (KITS) ×2 IMPLANT
MANIFOLD NEPTUNE II (INSTRUMENTS) ×2 IMPLANT
MARKER SPHERE PSV REFLC THRD 5 (MARKER) IMPLANT
NEEDLE 18GX1X1/2 (RX/OR ONLY) (NEEDLE) ×2 IMPLANT
NEEDLE SPNL 18GX3.5 QUINCKE PK (NEEDLE) ×2 IMPLANT
NS IRRIG 1000ML POUR BTL (IV SOLUTION) ×2 IMPLANT
PACK TOTAL JOINT (CUSTOM PROCEDURE TRAY) ×2 IMPLANT
PAD ARMBOARD 7.5X6 YLW CONV (MISCELLANEOUS) ×4 IMPLANT
PAD CAST 4YDX4 CTTN HI CHSV (CAST SUPPLIES) ×1 IMPLANT
PADDING CAST COTTON 4X4 STRL (CAST SUPPLIES) ×1
PADDING CAST COTTON 6X4 STRL (CAST SUPPLIES) ×4 IMPLANT
PIN SCHANZ 4MM 130MM (PIN) IMPLANT
RUBBERBAND STERILE (MISCELLANEOUS) ×2 IMPLANT
SET HNDPC FAN SPRY TIP SCT (DISPOSABLE) ×1 IMPLANT
SPONGE GAUZE 4X4 12PLY (GAUZE/BANDAGES/DRESSINGS) ×2 IMPLANT
SPONGE LAP 18X18 X RAY DECT (DISPOSABLE) IMPLANT
STAPLER VISISTAT 35W (STAPLE) ×2 IMPLANT
SUCTION FRAZIER TIP 10 FR DISP (SUCTIONS) ×2 IMPLANT
SUT ETHILON 3 0 PS 1 (SUTURE) IMPLANT
SUT VIC AB 0 CTB1 27 (SUTURE) ×4 IMPLANT
SUT VIC AB 1 CT1 27 (SUTURE) ×4
SUT VIC AB 1 CT1 27XBRD ANBCTR (SUTURE) ×4 IMPLANT
SUT VIC AB 2-0 CT1 27 (SUTURE) ×2
SUT VIC AB 2-0 CT1 TAPERPNT 27 (SUTURE) ×2 IMPLANT
SYR 30ML SLIP (SYRINGE) ×2 IMPLANT
SYR TB 1ML LUER SLIP (SYRINGE) ×2 IMPLANT
TOWEL OR 17X24 6PK STRL BLUE (TOWEL DISPOSABLE) ×2 IMPLANT
TOWEL OR 17X26 10 PK STRL BLUE (TOWEL DISPOSABLE) ×4 IMPLANT
TRAY FOLEY CATH 14FR (SET/KITS/TRAYS/PACK) ×2 IMPLANT
WATER STERILE IRR 1000ML POUR (IV SOLUTION) ×2 IMPLANT

## 2013-01-17 NOTE — Progress Notes (Signed)
ANTICOAGULATION CONSULT NOTE - Initial Consult  Pharmacy Consult for Coumadin Indication: VTE prophylaxis  Allergies  Allergen Reactions  . Darvon Other (See Comments)    MADE PT STAY AWAKE   . Macrobid (Nitrofurantoin Monohydrate Macrocrystals) Nausea And Vomiting    Patient Measurements: Wt= 91.3kg  Vital Signs: Temp: 97.6 F (36.4 C) (03/11 1757) Temp src: Oral (03/11 0915) BP: 138/62 mmHg (03/11 1757) Pulse Rate: 79 (03/11 1757)  Labs:  Recent Labs  01/16/13 1113  HGB 13.1  HCT 39.6  PLT 394  APTT 35  LABPROT 13.7  INR 1.06  CREATININE 0.83    The CrCl is unknown because both a height and weight (above a minimum accepted value) are required for this calculation.   Medical History: Past Medical History  Diagnosis Date  . Hypertension   . GERD (gastroesophageal reflux disease)   . History of kidney stones   . Hyperglycemia   . Nocturia   . Frequency of urination   . Glaucoma(365) BILATERAL EYES  . Wears glasses   . Complication of anesthesia     slow to wake up  . Diabetes mellitus without complication     PRE DIABETIC ORAL MEDS   . Hydronephrosis, left     DR. STEVEN DAHLSTADT  . Calculus of left kidney   . Arthritis KNEES    OA    Medications:  Prescriptions prior to admission  Medication Sig Dispense Refill  . aspirin EC 81 MG tablet Take 81 mg by mouth at bedtime.       Marland Kitchen CALCIUM-VITAMIN D PO Take 1 tablet by mouth daily.      . cholecalciferol (VITAMIN D) 1000 UNITS tablet Take 1,000 Units by mouth daily.       Marland Kitchen docusate sodium (COLACE) 100 MG capsule Take 100 mg by mouth 2 (two) times daily.       Marland Kitchen HYDROcodone-acetaminophen (VICODIN) 5-500 MG per tablet Take 1 tablet by mouth every 6 (six) hours as needed for pain. PAIN       . lansoprazole (PREVACID) 15 MG capsule Take 15 mg by mouth 2 (two) times daily.       Marland Kitchen latanoprost (XALATAN) 0.005 % ophthalmic solution Place 1 drop into both eyes at bedtime.       . metFORMIN (GLUCOPHAGE-XR)  500 MG 24 hr tablet Take 500 mg by mouth daily with breakfast.      . metoprolol tartrate (LOPRESSOR) 25 MG tablet Take 25 mg by mouth 2 (two) times daily.       . Multiple Vitamins-Minerals (MULTIVITAMINS THER. W/MINERALS) TABS Take 1 tablet by mouth daily.       . naproxen (NAPROSYN) 500 MG tablet Take 500 mg by mouth 2 (two) times daily with a meal.         Assessment: 72 yo female s/p L TKA to begin coumadin for VTE prophylaxis (baseline INR= 1.06 on 01/16/13).  Goal of Therapy:  INR 2-3 Monitor platelets by anticoagulation protocol: Yes   Plan:  -Coumadin 5mg  po x1 today -Daily PT/INR -Will begin education process  Harland German, Pharm D 01/17/2013 6:08 PM

## 2013-01-17 NOTE — Brief Op Note (Signed)
01/17/2013  1:25 PM  PATIENT:  Dorothy Anderson  72 y.o. female  PRE-OPERATIVE DIAGNOSIS:  Left Knee Osteoarthritis  POST-OPERATIVE DIAGNOSIS:  Left Knee Osteoarthritis  PROCEDURE:  Procedure(s): TOTAL KNEE ARTHROPLASTY  SURGEON:  Surgeon(s): Cammy Copa, MD  ASSISTANT: s vernon pa  ANESTHESIA:   general  EBL: 75 ml    Total I/O In: 1000 [I.V.:1000] Out: 200 [Urine:200]  BLOOD ADMINISTERED: none  DRAINS: none   LOCAL MEDICATIONS USED:  none  SPECIMEN:  No Specimen  COUNTS:  YES  TOURNIQUET:  * Missing tourniquet times found for documented tourniquets in log:  [id number] *  DICTATION: .Other Dictation: Dictation Number [id number]  PLAN OF CARE: Admit to inpatient   PATIENT DISPOSITION:  PACU - hemodynamically stable

## 2013-01-17 NOTE — Anesthesia Procedure Notes (Signed)
Procedure Name: LMA Insertion Date/Time: 01/17/2013 11:06 AM Performed by: Jerilee Hoh Pre-anesthesia Checklist: Patient identified, Emergency Drugs available, Suction available and Patient being monitored Patient Re-evaluated:Patient Re-evaluated prior to inductionOxygen Delivery Method: Circle system utilized Preoxygenation: Pre-oxygenation with 100% oxygen Intubation Type: IV induction LMA: LMA inserted LMA Size: 4.0 Tube type: Oral Number of attempts: 1 Placement Confirmation: positive ETCO2 and breath sounds checked- equal and bilateral Tube secured with: Tape Dental Injury: Teeth and Oropharynx as per pre-operative assessment

## 2013-01-17 NOTE — Anesthesia Preprocedure Evaluation (Signed)
Anesthesia Evaluation  Patient identified by MRN, date of birth, ID band Patient awake    Reviewed: Allergy & Precautions, H&P , NPO status , Patient's Chart, lab work & pertinent test results  Airway Mallampati: I TM Distance: >3 FB Neck ROM: Full    Dental   Pulmonary          Cardiovascular hypertension, Pt. on medications     Neuro/Psych    GI/Hepatic GERD-  Medicated and Controlled,  Endo/Other  diabetes, Well Controlled, Type 2, Oral Hypoglycemic Agents  Renal/GU      Musculoskeletal   Abdominal   Peds  Hematology   Anesthesia Other Findings   Reproductive/Obstetrics                           Anesthesia Physical Anesthesia Plan  ASA: II  Anesthesia Plan: General   Post-op Pain Management:    Induction: Intravenous  Airway Management Planned: LMA  Additional Equipment:   Intra-op Plan:   Post-operative Plan: Extubation in OR  Informed Consent: I have reviewed the patients History and Physical, chart, labs and discussed the procedure including the risks, benefits and alternatives for the proposed anesthesia with the patient or authorized representative who has indicated his/her understanding and acceptance.     Plan Discussed with: CRNA and Surgeon  Anesthesia Plan Comments:         Anesthesia Quick Evaluation

## 2013-01-17 NOTE — Interval H&P Note (Signed)
History and Physical Interval Note:  01/17/2013 7:17 AM  Dorothy Anderson  has presented today for surgery, with the diagnosis of Left Knee Osteoarthritis  The various methods of treatment have been discussed with the patient and family. After consideration of risks, benefits and other options for treatment, the patient has consented to  Procedure(s) with comments: TOTAL KNEE ARTHROPLASTY (Left) - Left Total Knee Arthroplasty as a surgical intervention .  The patient's history has been reviewed, patient examined, no change in status, stable for surgery.  I have reviewed the patient's chart and labs.  Questions were answered to the patient's satisfaction.     Shelvia Fojtik SCOTT

## 2013-01-17 NOTE — Anesthesia Postprocedure Evaluation (Signed)
Anesthesia Post Note  Patient: Dorothy Anderson  Procedure(s) Performed: Procedure(s) (LRB): TOTAL KNEE ARTHROPLASTY (Left)  Anesthesia type: general  Patient location: PACU  Post pain: Pain level controlled  Post assessment: Patient's Cardiovascular Status Stable  Last Vitals:  Filed Vitals:   01/17/13 1430  BP: 113/71  Pulse: 85  Temp:   Resp: 19    Post vital signs: Reviewed and stable  Level of consciousness: sedated  Complications: No apparent anesthesia complications

## 2013-01-17 NOTE — Progress Notes (Signed)
Assumed care at this time

## 2013-01-17 NOTE — Transfer of Care (Signed)
Immediate Anesthesia Transfer of Care Note  Patient: Dorothy Anderson  Procedure(s) Performed: Procedure(s) with comments: TOTAL KNEE ARTHROPLASTY (Left) - Left Total Knee Arthroplasty  Patient Location: PACU  Anesthesia Type:General  Level of Consciousness: awake, alert , oriented and patient cooperative  Airway & Oxygen Therapy: Patient Spontanous Breathing and Patient connected to face mask oxygen  Post-op Assessment: Report given to PACU RN, Post -op Vital signs reviewed and stable and Patient moving all extremities  Post vital signs: Reviewed and stable  Complications: No apparent anesthesia complications

## 2013-01-17 NOTE — Progress Notes (Signed)
Orthopedic Tech Progress Note Patient Details:  Dorothy Anderson 04-25-1941 [id number]  CPM Left Knee CPM Left Knee: On Left Knee Flexion (Degrees): 45 Left Knee Extension (Degrees): 0 ohf will be provided when one becomes available  [name] 01/17/2013, 3:25 PM

## 2013-01-17 NOTE — Preoperative (Signed)
Beta Blockers   Reason not to administer Beta Blockers:Not Applicable 

## 2013-01-18 LAB — BASIC METABOLIC PANEL
BUN: 10 mg/dL (ref 6–23)
CO2: 27 mEq/L (ref 19–32)
Calcium: 8.9 mg/dL (ref 8.4–10.5)
Chloride: 101 mEq/L (ref 96–112)
Creatinine, Ser: 0.82 mg/dL (ref 0.50–1.10)
GFR calc Af Amer: 80 mL/min — ABNORMAL LOW (ref >90)
GFR calc non Af Amer: 69 mL/min — ABNORMAL LOW (ref >90)
Glucose, Bld: 161 mg/dL — ABNORMAL HIGH (ref 70–99)
Potassium: 4 mEq/L (ref 3.5–5.1)
Sodium: 138 mEq/L (ref 135–145)

## 2013-01-18 LAB — GLUCOSE, CAPILLARY
Glucose-Capillary: 146 mg/dL — ABNORMAL HIGH (ref 70–99)
Glucose-Capillary: 147 mg/dL — ABNORMAL HIGH (ref 70–99)
Glucose-Capillary: 131 mg/dL — ABNORMAL HIGH (ref 70–99)

## 2013-01-18 LAB — CBC
HCT: 32.8 % — ABNORMAL LOW (ref 36.0–46.0)
Hemoglobin: 10.8 g/dL — ABNORMAL LOW (ref 12.0–15.0)
MCH: 30.5 pg (ref 26.0–34.0)
MCHC: 32.9 g/dL (ref 30.0–36.0)
MCV: 92.7 fL (ref 78.0–100.0)
Platelets: 306 10*3/uL (ref 150–400)
RBC: 3.54 MIL/uL — ABNORMAL LOW (ref 3.87–5.11)
RDW: 17.4 % — ABNORMAL HIGH (ref 11.5–15.5)
WBC: 10.4 10*3/uL (ref 4.0–10.5)

## 2013-01-18 LAB — PROTIME-INR
INR: 1.2 (ref 0.00–1.49)
Prothrombin Time: 15 seconds (ref 11.6–15.2)

## 2013-01-18 MED ORDER — WARFARIN SODIUM 5 MG PO TABS
5.0000 mg | ORAL_TABLET | Freq: Once | ORAL | Status: AC
  Administered 2013-01-18: 5 mg via ORAL
  Filled 2013-01-18: qty 1

## 2013-01-18 NOTE — Progress Notes (Signed)
Subjective: Pt stable - having pain   Objective: Vital signs in last 24 hours: Temp:  [97.6 F (36.4 C)-98.8 F (37.1 C)] 98.4 F (36.9 C) (03/12 0509) Pulse Rate:  [67-101] 101 (03/12 0509) Resp:  [9-28] 20 (03/12 0509) BP: (113-155)/(43-80) 124/52 mmHg (03/12 0509) SpO2:  [81 %-100 %] 95 % (03/12 0509)  Intake/Output from previous day: 03/11 0701 - 03/12 0700 In: 2630 [P.O.:480; I.V.:2150] Out: 425 [Urine:425] Intake/Output this shift: Total I/O In: -  Out: 1300 [Urine:1300]  Exam:  Neurovascular intact Sensation intact distally Intact pulses distally Dorsiflexion/Plantar flexion intact  Labs:  Recent Labs  01/16/13 1113 01/18/13 0705  HGB 13.1 10.8*    Recent Labs  01/16/13 1113 01/18/13 0705  WBC 11.0* 10.4  RBC 4.28 3.54*  HCT 39.6 32.8*  PLT 394 306    Recent Labs  01/16/13 1113  NA 140  K 4.2  CL 103  CO2 30  BUN 16  CREATININE 0.83  GLUCOSE 128*  CALCIUM 9.5    Recent Labs  01/16/13 1113 01/18/13 0705  INR 1.06 1.20    Assessment/Plan: Pt stable - dc pca today - mobilize with PT   DEAN,GREGORY SCOTT 01/18/2013, 8:00 AM

## 2013-01-18 NOTE — Progress Notes (Signed)
Physical Therapy Treatment Patient Details Name: Dorothy Anderson MRN: [id number] DOB: 1941-07-03 Today's Date: 01/18/2013 Time: 1191-4782 PT Time Calculation (min): 36 min  PT Assessment / Plan / Recommendation Comments on Treatment Session  Pt presents with L TKA. Pt requires max encouragement to participate in therapy; she is motivated to go home but requires the encouragement. SHe is very impulsive when moving secondary to c/o of increase pain in L Knee. Pt does not wish to D/C to SNF. PT recommend SNF at this point secondary to decreased mobility and decreased safety awareness.      Follow Up Recommendations  SNF     Does the patient have the potential to tolerate intense rehabilitation     Barriers to Discharge        Equipment Recommendations  None recommended by PT    Recommendations for Other Services    Frequency 7X/week   Plan Discharge plan remains appropriate;Frequency remains appropriate    Precautions / Restrictions Precautions Precautions: Fall;Knee Precaution Booklet Issued: Yes (comment) Required Braces or Orthoses: Knee Immobilizer - Left Knee Immobilizer - Left: On except when in CPM Restrictions Weight Bearing Restrictions: Yes LLE Weight Bearing: Weight bearing as tolerated   Pertinent Vitals/Pain 8/10 with rest and 10/10 with movement. Premedicated.     Mobility  Bed Mobility Bed Mobility: Supine to Sit;Sitting - Scoot to Edge of Bed;Sit to Supine Supine to Sit: With rails;HOB elevated;3: Mod assist Sitting - Scoot to Edge of Bed: 4: Min assist;With rail Sit to Supine: Not Tested (comment) Details for Bed Mobility Assistance: Pt likes to do things herself but requires assistance with L LE. She required max encouragement to participate in therapy. Pt O2 stat dropped to 88% on room air; recovered quickly when placed back on 3L of O2. Transfers Transfers: Pharmacologist;Sit to Stand;Stand to Sit Sit to Stand: Other (comment) (unable to stand  upright; trunk flexed for entire SPT ) Stand to Sit: To chair/3-in-1;With armrests;2: Max assist;3: Mod assist Stand Pivot Transfers: With armrests;From elevated surface;3: Mod assist;2: Max assist (required continuous cues for safety and sequencing. ) Details for Transfer Assistance: required continuous cues and max encouragement. Pt very impulsive. required verbal and tactile cues for hand placement and weight shifting.  Pt demo decreased safety awareness with RW; diid not use for SPT. Ambulation/Gait Ambulation/Gait Assistance: Not tested (comment) Stairs: No Wheelchair Mobility Wheelchair Mobility: No    Exercises     PT Diagnosis:    PT Problem List:   PT Treatment Interventions:     PT Goals Acute Rehab PT Goals PT Goal Formulation: With patient Time For Goal Achievement: 01/25/13 Potential to Achieve Goals: Good PT Goal: Supine/Side to Sit - Progress: Progressing toward goal PT Goal: Sit at Edge Of Bed - Progress: Progressing toward goal PT Goal: Stand to Sit - Progress: Progressing toward goal PT Transfer Goal: Bed to Chair/Chair to Bed - Progress: Progressing toward goal PT Goal: Stand - Progress: Progressing toward goal PT Goal: Ambulate - Progress: Progressing toward goal  Visit Information  Last PT Received On: 01/18/13 Assistance Needed: +2    Subjective Data  Subjective: I am not going to a rehab facility. I am going hom. Patient Stated Goal: go home with husband   Cognition  Cognition Overall Cognitive Status: Appears within functional limits for tasks assessed/performed Arousal/Alertness: Awake/alert Orientation Level: Appears intact for tasks assessed Behavior During Session: [facility] for tasks performed    Balance  Balance Balance Assessed: No  End of Session PT -  End of Session Equipment Utilized During Treatment: Gait belt;Left knee immobilizer Activity Tolerance: Patient limited by pain Patient left: in chair;with call bell/phone within reach;with  family/visitor present Nurse Communication: Mobility status (encouraged nursing tech to transfer with 2+ for safety)   GP     Donell Sievert, [place] [phone] 01/18/2013, 2:35 PM

## 2013-01-18 NOTE — Progress Notes (Signed)
UR COMPLETED  

## 2013-01-18 NOTE — Evaluation (Signed)
Physical Therapy Evaluation Patient Details Name: Dorothy Anderson MRN: [id number] DOB: 05-Jul-1941 Today's Date: 01/18/2013 Time: 7829-5621 PT Time Calculation (min): 45 min  PT Assessment / Plan / Recommendation Clinical Impression  Pt presents with L TKA POD 1. Pt unable to complete sit to stand transfer; SPT transfer to chair secondary to decreased motivation and c/o increase pain. RR increased to 55 sitting EOB, recovered with cues. Pt very upset when trying to move to EOB and onto bed. Pt sat EOB x 10 min; attempted transfer but was unsafe and did not want to stand. Pt wants to go home with husband. PT recommend SNF upon acute d/c pending rehab progress, recommendations made to pt, pt agreeable.     PT Assessment  Patient needs continued PT services    Follow Up Recommendations  SNF    Does the patient have the potential to tolerate intense rehabilitation      Barriers to Discharge        Equipment Recommendations       Recommendations for Other Services OT consult   Frequency 7X/week    Precautions / Restrictions Precautions Precautions: Fall;Knee Precaution Booklet Issued: Yes (comment) Required Braces or Orthoses: Knee Immobilizer - Left Knee Immobilizer - Left: On except when in CPM Restrictions Weight Bearing Restrictions: Yes LLE Weight Bearing: Weight bearing as tolerated   Pertinent Vitals/Pain 10/10 pain with movement. 7/10 at rest. RR reached 55 with activity. Could recover quickly with cues for proper breathing.      Mobility  Bed Mobility Bed Mobility: Supine to Sit;Sitting - Scoot to Edge of Bed;Sit to Supine Supine to Sit: With rails;HOB elevated;3: Mod assist Sitting - Scoot to Edge of Bed: 4: Min assist;With rail Sit to Supine: 2: Max assist;HOB elevated Details for Bed Mobility Assistance: required max encouragement. very upset with moving; c/o increased pain; wanted to complete tasks herself. required nursing tech assistance for sit to  supine. Transfers Transfers: Sit to Stand (attempted sit to stand x3; pt demo decreased safety awarenes) Sit to Stand: Other (comment) (attempted sit to stand from elevated bed x3; pt unsafe.) Details for Transfer Assistance: unable to complete sit to stand transfer; pt demo decreased motivation and decreased safety awareness.  Ambulation/Gait Ambulation/Gait Assistance: Not tested (comment) Stairs: No Wheelchair Mobility Wheelchair Mobility: No    Exercises Total Joint Exercises Ankle Circles/Pumps: AROM;15 reps;Both;Supine   PT Diagnosis: Difficulty walking;Acute pain;Generalized weakness  PT Problem List: Decreased strength;Decreased range of motion;Decreased activity tolerance;Decreased balance;Decreased mobility;Decreased knowledge of use of DME;Decreased safety awareness;Decreased knowledge of precautions;Pain;Impaired sensation PT Treatment Interventions: DME instruction;Gait training;Stair training;Functional mobility training;Therapeutic activities;Therapeutic exercise;Balance training;Neuromuscular re-education;Patient/family education   PT Goals Acute Rehab PT Goals PT Goal Formulation: With patient Time For Goal Achievement: 01/18/13 Potential to Achieve Goals: Good Pt will go Supine/Side to Sit: with modified independence PT Goal: Supine/Side to Sit - Progress: Goal set today Pt will Sit at Edge of Bed: with modified independence PT Goal: Sit at Edge Of Bed - Progress: Goal set today Pt will go Sit to Supine/Side: with modified independence PT Goal: Sit to Supine/Side - Progress: Goal set today Pt will go Stand to Sit: with supervision PT Goal: Stand to Sit - Progress: Goal set today Pt will Transfer Bed to Chair/Chair to Bed: with supervision PT Transfer Goal: Bed to Chair/Chair to Bed - Progress: Goal set today Pt will Stand: with supervision PT Goal: Stand - Progress: Goal set today Pt will Ambulate: 51 - 150 feet;with supervision;with rolling walker PT Goal:  Ambulate - Progress: Goal set today Pt will Go Up / Down Stairs: 1-2 stairs;with supervision;with rail(s) PT Goal: Up/Down Stairs - Progress: Goal set today Pt will Perform Home Exercise Program: Independently PT Goal: Perform Home Exercise Program - Progress: Goal set today  Visit Information  Last PT Received On: 01/18/13 Assistance Needed: +2    Subjective Data  Subjective: I cannot stand. I am having lots of pain in the back of my knee. I just need my husband to help me. Patient Stated Goal: go home with husband   Prior Functioning  Home Living Lives With: Spouse Available Help at Discharge: Family;Available 24 hours/day Type of Home: House Home Access: Stairs to enter [employer] of Steps: 2 Entrance Stairs-Rails: Left Home Layout: One level Bathroom Shower/Tub: Health visitor: Standard Home Adaptive Equipment: Environmental consultant - rolling;Straight cane Prior Function Level of Independence: Independent Able to Take Stairs?: Yes Driving: Yes Vocation: Retired Musician: No difficulties    Copywriter, advertising Overall Cognitive Status: Appears within functional limits for tasks assessed/performed Arousal/Alertness: Awake/alert Orientation Level: Appears intact for tasks assessed Behavior During Session: [facility] for tasks performed    Extremity/Trunk Assessment Right Lower Extremity Assessment RLE ROM/Strength/Tone: Within functional levels RLE Sensation: WFL - Light Touch Left Lower Extremity Assessment LLE ROM/Strength/Tone: Deficits LLE ROM/Strength/Tone Deficits: decreased secondary to pain and surgery LLE Sensation: Deficits LLE Sensation Deficits: diminished to light touch around ankle and great toe.   Balance Balance Balance Assessed: No  End of Session PT - End of Session Equipment Utilized During Treatment: Gait belt;Left knee immobilizer Activity Tolerance: Patient limited by pain Patient left: in bed;with call bell/phone  within reach;with bed alarm set;with nursing in room Nurse Communication: Mobility status CPM Left Knee CPM Left Knee: Off  GP     [place], [place] N 01/18/2013, 9:41 AM

## 2013-01-18 NOTE — Progress Notes (Signed)
ANTICOAGULATION CONSULT NOTE - Initial Consult  Pharmacy Consult for Coumadin Indication: VTE prophylaxis  Allergies  Allergen Reactions  . Darvon Other (See Comments)    MADE PT STAY AWAKE   . Macrobid (Nitrofurantoin Monohydrate Macrocrystals) Nausea And Vomiting    Patient Measurements: Wt= 91.3kg  Vital Signs: Temp: 98.4 F (36.9 C) (03/12 0509) BP: 124/52 mmHg (03/12 0509) Pulse Rate: 101 (03/12 0509)  Labs:  Recent Labs  01/16/13 1113 01/18/13 0705  HGB 13.1 10.8*  HCT 39.6 32.8*  PLT 394 306  APTT 35  --   LABPROT 13.7 15.0  INR 1.06 1.20  CREATININE 0.83 0.82    The CrCl is unknown because both a height and weight (above a minimum accepted value) are required for this calculation.     Assessment: 72 yo female s/p L TKA to begin coumadin for VTE prophylaxis.  INR 1.2  Goal of Therapy:  INR 2-3 Monitor platelets by anticoagulation protocol: Yes   Plan:  -Coumadin 5mg  po x1 today -Daily PT/INR   Celedonio Miyamoto, PharmD, BCPS Clinical Pharmacist Pager [phone]   01/18/2013 12:24 PM

## 2013-01-18 NOTE — Op Note (Signed)
Dorothy Anderson, Dorothy Anderson                 ACCOUNT NO.:  [id number]  MEDICAL RECORD NO.:  [id number]  LOCATION:  [id number]                        FACILITY:  [facility]  PHYSICIAN:  Burnard Bunting, M.D.    DATE OF BIRTH:  [birth year]  DATE OF PROCEDURE:  01/17/2013 DATE OF DISCHARGE:                              OPERATIVE REPORT   PREOPERATIVE DIAGNOSIS:  Left knee arthritis.  POSTOPERATIVE DIAGNOSIS:  Left knee arthritis.  PROCEDURE:  Left total knee replacement using Stryker triathlon posterior stabilized knee 4 femur, 4 tibia, 16 poly insert, 29 mm patella.  SURGEON:  Burnard Bunting, MD  ASSISTANT:  Dorothy Neighbors, PA-C  ANESTHESIA:  General endotracheal.  ESTIMATED BLOOD LOSS:  75 mL.  TOURNIQUET TIME:  1 hour and 30 minutes at 300 mmHg.  INDICATIONS:  Dorothy Anderson is a patient with left knee arthritis, who presents for an operative management of knee arthritis with total knee replacement after explanation of risks and benefits.  PROCEDURE IN DETAIL:  The patient was brought to the operating room, where general endotracheal anesthesia was induced.  Preoperative antibiotics were administered.  Time-out was called.  Left leg was prescrubbed with alcohol and Betadine, which was allowed to dry, prepped with DuraPrep solution and draped in a sterile manner.  Dorothy Anderson was used to cover the operative field.  Leg was elevated, exsanguinated with Esmarch.  Tourniquet was inflated.  Anterior approach to the knee was made.  Skin and subcutaneous tissues were sharply divided.  Median parapatellar arthrotomy was made.  The patella was everted. Lateral patellofemoral ligament was released.  Precise location of the arthrotomy was marked with a #1 Vicryl suture.  Fat pad partially excised.  Soft tissue elevated off the anterior medial tibia.  The intramedullary alignment was then used to make the tibial cut perpendicular to mechanical axis.  A 9 mm resection was made off the least affected lateral side  which did give about a 1-2 mm resection off the lowest point on the most affected medial tibial plateau.  In the similar fashion, the distal femur cut was made 5 degrees valgus, 10 mm. This gave spacer block.  This allowed 9 mm spacer block to fit with about 5 degrees of hyperextension after this 2 cuts.  At this time, a box cut and chamfer cuts were made.  Tibia was then prepared with the center in the medial third of the tibial tubercle.  This was keel punched and the patella was then prepared, cutting from 24-14 and 9 mm replacement.  Asymmetric patella button was placed.  Trial reduction was performed with both 11, 13, and 16 mm spacer.  Well, the patient achieved hyperextension with the 11 and 13, but achieved full extension with a 16 and had the best varus valgus stress at 0, 30, and 90 degrees with the 16 spacer.  Trial components were removed.  The knee joint was thoroughly irrigated using 3 L of irrigating solution.  Then, the true components were cemented into position.  A trial reduction was again performed with 13 and 16 spacer after cement had hardened and excess cement was removed.  A 16 poly spacer gave full extension and excellent patellar  tracking with no thumbs technique and excellent varus valgus stability at 0, 30, and 90 degrees.  This was a spacer which was placed. Tourniquet was released.  The bleeding points were controlled using electrocautery.  Small irrigation was performed.  Incision was then closed over bolster using #1 Vicryl suture to reapproximate the arthrotomy.  A 0 Vicryl suture, 2-0 Vicryl suture, and skin staples were reapproximated the incision with solution of Marcaine, morphine, and clonidine injected into the knee for postop pain relief.  The patient tolerated the procedure well without immediate complication.  Bulky dressing and knee immobilizer in place.  Dorothy Anderson's assistance required at all times during the case opening, closing retraction,  limb positioning, her assistance was a medical necessity.     Burnard Bunting, M.D.     GSD/MEDQ  D:  01/17/2013  T:  01/18/2013  Job:  [id number]

## 2013-01-19 LAB — GLUCOSE, CAPILLARY
Glucose-Capillary: 148 mg/dL — ABNORMAL HIGH (ref 70–99)
Glucose-Capillary: 135 mg/dL — ABNORMAL HIGH (ref 70–99)
Glucose-Capillary: 111 mg/dL — ABNORMAL HIGH (ref 70–99)
Glucose-Capillary: 138 mg/dL — ABNORMAL HIGH (ref 70–99)

## 2013-01-19 LAB — CBC
HCT: 31.1 % — ABNORMAL LOW (ref 36.0–46.0)
Hemoglobin: 10.6 g/dL — ABNORMAL LOW (ref 12.0–15.0)
MCH: 30.9 pg (ref 26.0–34.0)
MCHC: 34.1 g/dL (ref 30.0–36.0)
MCV: 90.7 fL (ref 78.0–100.0)
Platelets: 260 10*3/uL (ref 150–400)
RBC: 3.43 MIL/uL — ABNORMAL LOW (ref 3.87–5.11)
RDW: 17.3 % — ABNORMAL HIGH (ref 11.5–15.5)
WBC: 10.8 10*3/uL — ABNORMAL HIGH (ref 4.0–10.5)

## 2013-01-19 LAB — PROTIME-INR
INR: 1.78 — ABNORMAL HIGH (ref 0.00–1.49)
Prothrombin Time: 20.1 seconds — ABNORMAL HIGH (ref 11.6–15.2)

## 2013-01-19 MED ORDER — WARFARIN SODIUM 2.5 MG PO TABS
2.5000 mg | ORAL_TABLET | Freq: Once | ORAL | Status: AC
  Administered 2013-01-19: 2.5 mg via ORAL
  Filled 2013-01-19: qty 1

## 2013-01-19 MED ORDER — HYDROCODONE-ACETAMINOPHEN 10-325 MG PO TABS
1.0000 | ORAL_TABLET | ORAL | Status: DC | PRN
  Administered 2013-01-19 – 2013-01-21 (×11): 1 via ORAL
  Filled 2013-01-19 (×11): qty 1

## 2013-01-19 MED ORDER — METHOCARBAMOL 100 MG/ML IJ SOLN
500.0000 mg | Freq: Four times a day (QID) | INTRAMUSCULAR | Status: DC | PRN
  Filled 2013-01-19: qty 5

## 2013-01-19 MED ORDER — METHOCARBAMOL 500 MG PO TABS
500.0000 mg | ORAL_TABLET | Freq: Four times a day (QID) | ORAL | Status: DC | PRN
  Administered 2013-01-19 – 2013-01-21 (×8): 500 mg via ORAL
  Filled 2013-01-19 (×8): qty 1

## 2013-01-19 NOTE — Progress Notes (Signed)
Physical Therapy Treatment Patient Details Name: Dorothy Anderson MRN: [id number] DOB: 17-Jan-1941 Today's Date: 01/19/2013 Time: 0927-1001 PT Time Calculation (min): 34 min  PT Assessment / Plan / Recommendation Comments on Treatment Session  Pt presents with L TKA. Pt requires max encouragement to participate in therapy; Patient with very slow progression this morning. Understands that she needs more rehab prior to discharging home    Follow Up Recommendations  SNF     Does the patient have the potential to tolerate intense rehabilitation     Barriers to Discharge        Equipment Recommendations  None recommended by PT    Recommendations for Other Services    Frequency 7X/week   Plan Discharge plan remains appropriate;Frequency remains appropriate    Precautions / Restrictions Precautions Precautions: Fall;Knee Required Braces or Orthoses: Knee Immobilizer - Left Knee Immobilizer - Left: On except when in CPM Restrictions Weight Bearing Restrictions: Yes LLE Weight Bearing: Weight bearing as tolerated   Pertinent Vitals/Pain     Mobility  Bed Mobility Bed Mobility: Supine to Sit;Sitting - Scoot to Edge of Bed;Sit to Supine Supine to Sit: With rails;HOB elevated;3: Mod assist Sitting - Scoot to Edge of Bed: 4: Min assist;With rail Details for Bed Mobility Assistance: Patient required A for LLE out of bed and cues for technique and safety. Patient anxious throughout requiring increased time Transfers Sit to Stand: From chair/3-in-1;1: +2 Total assist;With armrests;From bed;3: Mod assist Sit to Stand: Patient Percentage: 50% Stand to Sit: To chair/3-in-1;With armrests;3: Mod assist;1: +2 Total assist Stand to Sit: Patient Percentage: 60% Stand Pivot Transfers: With armrests;From elevated surface;3: Mod assist;1: +2 Total assist Stand Pivot Transfers: Patient Percentage: 60% Details for Transfer Assistance: Patient required Mod A initally from bed however when trying to  stand from [facility] patient became fatiqued and dizzy. Patient unable to bear weight through her R LE and has very weak UEs. Demo'd decreased safety awareness Ambulation/Gait Ambulation/Gait Assistance: Not tested (comment)    Exercises     PT Diagnosis:    PT Problem List:   PT Treatment Interventions:     PT Goals Acute Rehab PT Goals PT Goal: Supine/Side to Sit - Progress: Progressing toward goal PT Goal: Stand to Sit - Progress: Progressing toward goal PT Transfer Goal: Bed to Chair/Chair to Bed - Progress: Progressing toward goal PT Goal: Stand - Progress: Progressing toward goal PT Goal: Perform Home Exercise Program - Progress: Progressing toward goal  Visit Information  Last PT Received On: 01/19/13 Assistance Needed: +2    Subjective Data      Cognition  Cognition Overall Cognitive Status: Appears within functional limits for tasks assessed/performed Arousal/Alertness: Awake/alert Orientation Level: Appears intact for tasks assessed Behavior During Session: [facility] for tasks performed    Balance  Balance Balance Assessed: No  End of Session PT - End of Session Equipment Utilized During Treatment: Gait belt;Left knee immobilizer Activity Tolerance: Patient limited by pain;Patient limited by fatigue Patient left: in chair;with call bell/phone within reach;with family/visitor present Nurse Communication: Mobility status   GP     Fredrich Birks 01/19/2013, 11:33 AM  01/19/2013 Fredrich Birks PTA [phone] pager [phone] office

## 2013-01-19 NOTE — Plan of Care (Signed)
Problem: Phase II Progression Outcomes Goal: Discharge plan established Recommend SNF for further therapy needs after acute care d/c     

## 2013-01-19 NOTE — Progress Notes (Signed)
ANTICOAGULATION CONSULT NOTE - Initial Consult  Pharmacy Consult for Coumadin Indication: VTE prophylaxis  Allergies  Allergen Reactions  . Darvon Other (See Comments)    MADE PT STAY AWAKE   . Macrobid (Nitrofurantoin Monohydrate Macrocrystals) Nausea And Vomiting    Patient Measurements: Wt= 91.3kg  Vital Signs: Temp: 100.3 F (37.9 C) (03/13 0540) Temp src: Oral (03/13 0540) BP: 144/60 mmHg (03/13 0540) Pulse Rate: 77 (03/13 0540)  Labs:  Recent Labs  01/18/13 0705 01/19/13 0710  HGB 10.8* 10.6*  HCT 32.8* 31.1*  PLT 306 260  LABPROT 15.0 20.1*  INR 1.20 1.78*  CREATININE 0.82  --     The CrCl is unknown because both a height and weight (above a minimum accepted value) are required for this calculation.     Assessment: 72 yo female s/p L TKA to begin coumadin for VTE prophylaxis.  INR 1.78  Goal of Therapy:  INR 2-3 Monitor platelets by anticoagulation protocol: Yes   Plan:  -Coumadin 2.5mg  po x1 today -Daily PT/INR -Warfarin education completed   Celedonio Miyamoto, PharmD, BCPS Clinical Pharmacist Pager [phone]   01/19/2013 1:08 PM

## 2013-01-19 NOTE — Progress Notes (Signed)
CARE MANAGEMENT NOTE 01/19/2013  Patient:  Dorothy Anderson, Dorothy Anderson   Account Number:  [id number]  Date Initiated:  01/19/2013  Documentation initiated by:  Vance Peper  Subjective/Objective Assessment:   72 yr old female s/p left total knee arthroplasty.     Action/Plan:   Patient is for shortterm rehab at snf. Social Worker is aware.   Anticipated DC Date:  01/20/2013   Anticipated DC Plan:  SKILLED NURSING FACILITY  In-house referral  Clinical Social Worker      DC Planning Services  CM consult      Choice offered to / List presented to:             Status of service:  Completed, signed off Medicare Important Message given?   (If response is "NO", the following Medicare IM given date fields will be blank) Date Medicare IM given:   Date Additional Medicare IM given:    Discharge Disposition:  SKILLED NURSING FACILITY  Per UR Regulation:    If discussed at Long Length of Stay Meetings, dates discussed:    Comments:

## 2013-01-19 NOTE — Progress Notes (Signed)
Subjective: Pt stable - pain controlled   Objective: Vital signs in last 24 hours: Temp:  [98.3 F (36.8 C)-100.3 F (37.9 C)] 100.3 F (37.9 C) (03/13 0540) Pulse Rate:  [76-93] 77 (03/13 0540) Resp:  [16-18] 16 (03/13 0540) BP: (122-144)/(58-105) 144/60 mmHg (03/13 0540) SpO2:  [95 %-98 %] 97 % (03/13 0540)  Intake/Output from previous day: 03/12 0701 - 03/13 0700 In: 980 [P.O.:480; I.V.:500] Out: 1600 [Urine:1600] Intake/Output this shift:    Exam:  Neurovascular intact Sensation intact distally Intact pulses distally Dorsiflexion/Plantar flexion intact  Labs:  Recent Labs  01/16/13 1113 01/18/13 0705 01/19/13 0710  HGB 13.1 10.8* 10.6*    Recent Labs  01/18/13 0705 01/19/13 0710  WBC 10.4 10.8*  RBC 3.54* 3.43*  HCT 32.8* 31.1*  PLT 306 260    Recent Labs  01/16/13 1113 01/18/13 0705  NA 140 138  K 4.2 4.0  CL 103 101  CO2 30 27  BUN 16 10  CREATININE 0.83 0.82  GLUCOSE 128* 161*  CALCIUM 9.5 8.9    Recent Labs  01/18/13 0705 01/19/13 0710  INR 1.20 1.78*    Assessment/Plan: Change pain meds - hl iv - cpm   DEAN,GREGORY SCOTT 01/19/2013, 8:12 AM

## 2013-01-19 NOTE — Evaluation (Signed)
Occupational Therapy Evaluation Patient Details Name: Dorothy Anderson MRN: [id number] DOB: 24-Apr-1941 Today's Date: 01/19/2013 Time: 4098-1191 OT Time Calculation (min): 34 min  OT Assessment / Plan / Recommendation Clinical Impression  Pt demos decline in function with ADLs, strength, balance, activity tolerance and safety following L TKR. Pt would benefit from OT services to address these impairments to help restore PLOF    OT Assessment  Patient needs continued OT Services    Follow Up Recommendations  SNF    Barriers to Discharge Decreased caregiver support pt lives at home with husband who may not be able to provide adequate, safe assist based upon pt's level of assist at this time  Equipment Recommendations  None recommended by OT;Other (comment) (TBD at SNF)    Recommendations for Other Services    Frequency  Min 2X/week    Precautions / Restrictions Precautions Precautions: Fall;Knee Required Braces or Orthoses: Knee Immobilizer - Left Knee Immobilizer - Left: On except when in CPM Restrictions Weight Bearing Restrictions: Yes LLE Weight Bearing: Weight bearing as tolerated       ADL  Grooming: Performed;Wash/dry hands;Wash/dry face;Supervision/safety;Set up Where Assessed - Grooming: Supported sitting Upper Body Bathing: Simulated;Minimal assistance Lower Body Bathing: +1 Total assistance Upper Body Dressing: Performed;Minimal assistance Lower Body Dressing: +1 Total assistance Toilet Transfer: +2 Total assistance Toilet Transfer Method: Stand pivot Toilet Transfer Equipment: Bedside commode Toileting - Clothing Manipulation and Hygiene: +1 Total assistance Where Assessed - Toileting Clothing Manipulation and Hygiene: Standing ADL Comments: pt familiar with ADL A/E from previous R knee surgery and from her husband's back surgeries    OT Diagnosis: Generalized weakness;Acute pain  OT Problem List: Decreased strength;Decreased knowledge of use of DME or  AE;Decreased activity tolerance;Impaired balance (sitting and/or standing);Pain OT Treatment Interventions: Self-care/ADL training;Balance training;Therapeutic exercise;Neuromuscular education;Therapeutic activities;DME and/or AE instruction;Patient/family education   OT Goals Acute Rehab OT Goals OT Goal Formulation: With patient Time For Goal Achievement: 01/26/13 Potential to Achieve Goals: Good ADL Goals Pt Will Perform Upper Body Bathing: with set-up;with supervision ADL Goal: Upper Body Bathing - Progress: Goal set today Pt Will Perform Lower Body Bathing: with max assist;with mod assist;with adaptive equipment ADL Goal: Lower Body Bathing - Progress: Goal set today Pt Will Perform Upper Body Dressing: with set-up;with supervision ADL Goal: Upper Body Dressing - Progress: Goal set today Pt Will Perform Lower Body Dressing: with mod assist;with max assist;with adaptive equipment ADL Goal: Lower Body Dressing - Progress: Goal set today Pt Will Perform Toileting - Clothing Manipulation: with max assist;Sitting on 3-in-1 or toilet;Standing ADL Goal: Toileting - Clothing Manipulation - Progress: Goal set today Pt Will Perform Toileting - Hygiene: with max assist;Sitting on 3-in-1 or toilet;Leaning right and/or left on 3-in-1/toilet;Standing at 3-in-1/toilet ADL Goal: Toileting - Hygiene - Progress: Goal set today  Visit Information  Last OT Received On: 01/19/13 Assistance Needed: +2 PT/OT Co-Evaluation/Treatment: Yes    Subjective Data  Subjective: " I am too weak to stand up " Patient Stated Goal: To return home   Prior Functioning     Home Living Lives With: Spouse Available Help at Discharge: Family;Available 24 hours/day Type of Home: House Home Access: Stairs to enter [employer] of Steps: 2 Entrance Stairs-Rails: Left Home Layout: One level Bathroom Shower/Tub: Health visitor: Standard Home Adaptive Equipment: Environmental consultant - rolling;Straight  cane Prior Function Level of Independence: Independent Able to Take Stairs?: Yes Driving: Yes Vocation: Retired Musician: No difficulties Dominant Hand: Right  Vision/Perception Vision - History Baseline Vision: Wears glasses all the time Patient Visual Report: No change from baseline Perception Perception: Within Functional Limits   Cognition  Cognition Overall Cognitive Status: Appears within functional limits for tasks assessed/performed Arousal/Alertness: Awake/alert Orientation Level: Appears intact for tasks assessed Behavior During Session: [facility] for tasks performed    Extremity/Trunk Assessment Right Upper Extremity Assessment RUE ROM/Strength/Tone: [facility] for tasks assessed Left Upper Extremity Assessment LUE ROM/Strength/Tone: [facility] for tasks assessed     Mobility Bed Mobility Bed Mobility: Supine to Sit;Sitting - Scoot to Edge of Bed;Sit to Supine Supine to Sit: With rails;HOB elevated;3: Mod assist Sitting - Scoot to Edge of Bed: 4: Min assist;With rail Details for Bed Mobility Assistance: Patient required A for LLE out of bed and cues for technique and safety. Patient anxious throughout requiring increased time Transfers Sit to Stand: From chair/3-in-1;1: +2 Total assist;With armrests;From bed;3: Mod assist Sit to Stand: Patient Percentage: 50% Stand to Sit: To chair/3-in-1;With armrests;3: Mod assist;1: +2 Total assist Stand to Sit: Patient Percentage: 60% Details for Transfer Assistance: Patient required Mod A initally from bed however when trying to stand from [facility] patient became fatiqued and dizzy. Patient unable to bear weight through her R LE and has very weak UEs. Demo'd decreased safety awareness     Exercise     Balance Balance Balance Assessed: No   End of Session OT - End of Session Equipment Utilized During Treatment:  (RW, BSC) Activity Tolerance: Patient limited by pain;Patient limited by fatigue Patient left: in  chair;with call bell/phone within reach  GO     Galen Manila 01/19/2013, 11:34 AM

## 2013-01-20 LAB — CBC
HCT: 30.5 % — ABNORMAL LOW (ref 36.0–46.0)
Hemoglobin: 10.3 g/dL — ABNORMAL LOW (ref 12.0–15.0)
MCH: 30.8 pg (ref 26.0–34.0)
MCHC: 33.8 g/dL (ref 30.0–36.0)
MCV: 91.3 fL (ref 78.0–100.0)
Platelets: 276 10*3/uL (ref 150–400)
RBC: 3.34 MIL/uL — ABNORMAL LOW (ref 3.87–5.11)
RDW: 17.3 % — ABNORMAL HIGH (ref 11.5–15.5)
WBC: 9.7 10*3/uL (ref 4.0–10.5)

## 2013-01-20 LAB — GLUCOSE, CAPILLARY
Glucose-Capillary: 123 mg/dL — ABNORMAL HIGH (ref 70–99)
Glucose-Capillary: 96 mg/dL (ref 70–99)
Glucose-Capillary: 123 mg/dL — ABNORMAL HIGH (ref 70–99)
Glucose-Capillary: 127 mg/dL — ABNORMAL HIGH (ref 70–99)

## 2013-01-20 LAB — PROTIME-INR
INR: 1.87 — ABNORMAL HIGH (ref 0.00–1.49)
Prothrombin Time: 20.8 seconds — ABNORMAL HIGH (ref 11.6–15.2)

## 2013-01-20 MED ORDER — HYDROCODONE-ACETAMINOPHEN 10-325 MG PO TABS: 1.0000 | ORAL_TABLET | ORAL | Status: DC | PRN

## 2013-01-20 MED ORDER — METHOCARBAMOL 500 MG PO TABS: 500.0000 mg | ORAL_TABLET | Freq: Three times a day (TID) | ORAL | Status: DC

## 2013-01-20 MED ORDER — WARFARIN SODIUM 5 MG PO TABS: 5.0000 mg | ORAL_TABLET | Freq: Every day | ORAL | Status: DC

## 2013-01-20 MED ORDER — WARFARIN SODIUM 2.5 MG PO TABS
2.5000 mg | ORAL_TABLET | Freq: Once | ORAL | Status: AC
  Administered 2013-01-20: 2.5 mg via ORAL
  Filled 2013-01-20: qty 1

## 2013-01-20 MED ORDER — MAGNESIUM CITRATE PO SOLN
1.0000 | Freq: Once | ORAL | Status: DC
  Filled 2013-01-20: qty 296

## 2013-01-20 NOTE — Discharge Summary (Signed)
Physician Discharge Summary  Patient ID: Dorothy Anderson MRN: [id number] DOB/AGE: [birth year] 72 y.o.  Admit date: 01/17/2013 Discharge date: 01/21/2013  Admission Diagnoses:  Principal Problem:   Osteoarthritis of left knee   Discharge Diagnoses:  Same  Surgeries: Procedure(s): TOTAL KNEE ARTHROPLASTY on 01/17/2013   Consultants:    Discharged Condition: Stable  Hospital Course: Dorothy Anderson is an 72 y.o. female who was admitted 01/17/2013 with a chief complaint of left knee pain, and found to have a diagnosis of Osteoarthritis of left knee.  They were brought to the operating room on 01/17/2013 and underwent the above named procedures.She tolerated the knee well - ambulated with PT and assist by time of discharge.    Antibiotics given:  Anti-infectives   Start     Dose/Rate Route Frequency Ordered Stop   01/17/13 2200  ceFAZolin (ANCEF) IVPB 2 g/50 mL premix     2 g 100 mL/hr over 30 Minutes Intravenous 3 times per day 01/17/13 1756 01/18/13 0650   01/17/13 0911  ceFAZolin (ANCEF) 2-3 GM-% IVPB SOLR    Comments:  SAVAGE, MICHELLE: cabinet override      01/17/13 0911 01/17/13 2114   01/17/13 0909  ceFAZolin (ANCEF) IVPB 2 g/50 mL premix     2 g 100 mL/hr over 30 Minutes Intravenous On call to O.R. 01/17/13 4098 01/17/13 1111    .  Recent vital signs:  Filed Vitals:   01/20/13 0800  BP:   Pulse:   Temp:   Resp: 18    Recent laboratory studies:  Results for orders placed during the hospital encounter of 01/17/13  GLUCOSE, CAPILLARY      Result Value Range   Glucose-Capillary 114 (*) 70 - 99 mg/dL  GLUCOSE, CAPILLARY      Result Value Range   Glucose-Capillary 138 (*) 70 - 99 mg/dL  PROTIME-INR      Result Value Range   Prothrombin Time 15.0  11.6 - 15.2 seconds   INR 1.20  0.00 - 1.49  CBC      Result Value Range   WBC 10.4  4.0 - 10.5 K/uL   RBC 3.54 (*) 3.87 - 5.11 MIL/uL   Hemoglobin 10.8 (*) 12.0 - 15.0 g/dL   HCT 11.9 (*) 14.7 - 82.9 %   MCV 92.7  78.0  - 100.0 fL   MCH 30.5  26.0 - 34.0 pg   MCHC 32.9  30.0 - 36.0 g/dL   RDW 56.2 (*) 13.0 - 86.5 %   Platelets 306  150 - 400 K/uL  BASIC METABOLIC PANEL      Result Value Range   Sodium 138  135 - 145 mEq/L   Potassium 4.0  3.5 - 5.1 mEq/L   Chloride 101  96 - 112 mEq/L   CO2 27  19 - 32 mEq/L   Glucose, Bld 161 (*) 70 - 99 mg/dL   BUN 10  6 - 23 mg/dL   Creatinine, Ser 7.84  0.50 - 1.10 mg/dL   Calcium 8.9  8.4 - 69.6 mg/dL   GFR calc non Af Amer 69 (*) >90 mL/min   GFR calc Af Amer 80 (*) >90 mL/min  GLUCOSE, CAPILLARY      Result Value Range   Glucose-Capillary 145 (*) 70 - 99 mg/dL  GLUCOSE, CAPILLARY      Result Value Range   Glucose-Capillary 146 (*) 70 - 99 mg/dL   Comment 1 Documented in Chart     Comment 2 Notify RN  GLUCOSE, CAPILLARY      Result Value Range   Glucose-Capillary 147 (*) 70 - 99 mg/dL   Comment 1 Documented in Chart     Comment 2 Notify RN    PROTIME-INR      Result Value Range   Prothrombin Time 20.1 (*) 11.6 - 15.2 seconds   INR 1.78 (*) 0.00 - 1.49  CBC      Result Value Range   WBC 10.8 (*) 4.0 - 10.5 K/uL   RBC 3.43 (*) 3.87 - 5.11 MIL/uL   Hemoglobin 10.6 (*) 12.0 - 15.0 g/dL   HCT 16.1 (*) 09.6 - 04.5 %   MCV 90.7  78.0 - 100.0 fL   MCH 30.9  26.0 - 34.0 pg   MCHC 34.1  30.0 - 36.0 g/dL   RDW 40.9 (*) 81.1 - 91.4 %   Platelets 260  150 - 400 K/uL  GLUCOSE, CAPILLARY      Result Value Range   Glucose-Capillary 131 (*) 70 - 99 mg/dL  GLUCOSE, CAPILLARY      Result Value Range   Glucose-Capillary 148 (*) 70 - 99 mg/dL  GLUCOSE, CAPILLARY      Result Value Range   Glucose-Capillary 135 (*) 70 - 99 mg/dL   Comment 1 Documented in Chart     Comment 2 Notify RN    PROTIME-INR      Result Value Range   Prothrombin Time 20.8 (*) 11.6 - 15.2 seconds   INR 1.87 (*) 0.00 - 1.49  CBC      Result Value Range   WBC 9.7  4.0 - 10.5 K/uL   RBC 3.34 (*) 3.87 - 5.11 MIL/uL   Hemoglobin 10.3 (*) 12.0 - 15.0 g/dL   HCT 78.2 (*) 95.6 - 21.3  %   MCV 91.3  78.0 - 100.0 fL   MCH 30.8  26.0 - 34.0 pg   MCHC 33.8  30.0 - 36.0 g/dL   RDW 08.6 (*) 57.8 - 46.9 %   Platelets 276  150 - 400 K/uL  GLUCOSE, CAPILLARY      Result Value Range   Glucose-Capillary 111 (*) 70 - 99 mg/dL   Comment 1 Documented in Chart     Comment 2 Notify RN    GLUCOSE, CAPILLARY      Result Value Range   Glucose-Capillary 138 (*) 70 - 99 mg/dL  GLUCOSE, CAPILLARY      Result Value Range   Glucose-Capillary 123 (*) 70 - 99 mg/dL    Discharge Medications:     Medication List    TAKE these medications       aspirin EC 81 MG tablet  Take 81 mg by mouth at bedtime.     CALCIUM-VITAMIN D PO  Take 1 tablet by mouth daily.     cholecalciferol 1000 UNITS tablet  Commonly known as:  VITAMIN D  Take 1,000 Units by mouth daily.     docusate sodium 100 MG capsule  Commonly known as:  COLACE  Take 100 mg by mouth 2 (two) times daily.     HYDROcodone-acetaminophen 10-325 MG per tablet  Commonly known as:  NORCO  Take 1 tablet by mouth every 4 (four) hours as needed.     lansoprazole 15 MG capsule  Commonly known as:  PREVACID  Take 15 mg by mouth 2 (two) times daily.     latanoprost 0.005 % ophthalmic solution  Commonly known as:  XALATAN  Place 1 drop into both eyes at  bedtime.     metFORMIN 500 MG 24 hr tablet  Commonly known as:  GLUCOPHAGE-XR  Take 500 mg by mouth daily with breakfast.     methocarbamol 500 MG tablet  Commonly known as:  ROBAXIN  Take 1 tablet (500 mg total) by mouth 3 (three) times daily.     metoprolol tartrate 25 MG tablet  Commonly known as:  LOPRESSOR  Take 25 mg by mouth 2 (two) times daily.     multivitamins ther. w/minerals Tabs  Take 1 tablet by mouth daily.     warfarin 5 MG tablet  Commonly known as:  COUMADIN  Take 1 tablet (5 mg total) by mouth daily.      ASK your doctor about these medications       HYDROcodone-acetaminophen 5-500 MG per tablet  Commonly known as:  VICODIN  Take 1 tablet by  mouth every 6 (six) hours as needed for pain. PAIN       naproxen 500 MG tablet  Commonly known as:  NAPROSYN  Take 500 mg by mouth 2 (two) times daily with a meal.        Diagnostic Studies: Dg Chest 2 View  01/16/2013  *RADIOLOGY REPORT*  Clinical Data: Preoperative respiratory exam.  Arthritis of the knee.  CHEST - 2 VIEW  Comparison: Chest x-ray dated 06/16/2005  Findings: The heart size and pulmonary vascularity are normal and the lungs are clear.  No acute osseous abnormality.  IMPRESSION: No acute disease.   Original Report Authenticated By: Francene Boyers, M.D.    Dg Knee 1-2 Views Left  01/16/2013  *RADIOLOGY REPORT*  Clinical Data: Preop total knee arthroplasty  LEFT KNEE - 1-2 VIEW  Comparison: None.  Findings: Advanced tricompartmental osteoarthritis.  Joint space narrowing is most significant in the medial compartment.  There are large osteophytes in the patellofemoral compartment.  There is a small associated suprapatellar joint effusion.  No evidence for acute fracture, malalignment or aggressive osseous lesions.  IMPRESSION: Advanced tricompartmental osteoarthritis worst in the patellofemoral and medial compartments.  Small joint effusion.   Original Report Authenticated By: Malachy Moan, M.D.     Disposition: 01-Home or Self Care      Discharge Orders   Future Orders Complete By Expires     Call MD / Call 911  As directed     Comments:      If you experience chest pain or shortness of breath, CALL 911 and be transported to the hospital emergency room.  If you develope a fever above 101 F, pus (white drainage) or increased drainage or redness at the wound, or calf pain, call your surgeon's office.    Constipation Prevention  As directed     Comments:      Drink plenty of fluids.  Prune juice may be helpful.  You may use a stool softener, such as Colace (over the counter) 100 mg twice a day.  Use MiraLax (over the counter) for constipation as needed.    Diet - low  sodium heart healthy  As directed     Discharge instructions  As directed     Comments:      1. Weight bearing as tolerated 2. CPM and PT daily for range of motion and strengthening 3.Keep incision dry    Increase activity slowly as tolerated  As directed           Signed: Winnifred Dufford SCOTT 01/20/2013, 12:04 PM

## 2013-01-20 NOTE — Progress Notes (Signed)
Subjective: Pt stable - pain better controlled   Objective: Vital signs in last 24 hours: Temp:  [97.1 F (36.2 C)-100.2 F (37.9 C)] 97.1 F (36.2 C) (03/14 0611) Pulse Rate:  [89-95] 89 (03/14 0611) Resp:  [16-18] 18 (03/14 0800) BP: (103-131)/(50-60) 103/56 mmHg (03/14 0611) SpO2:  [97 %-99 %] 97 % (03/14 0611)  Intake/Output from previous day:   Intake/Output this shift:    Exam:  Neurovascular intact Sensation intact distally Intact pulses distally Dorsiflexion/Plantar flexion intact  Labs:  Recent Labs  01/18/13 0705 01/19/13 0710 01/20/13 0611  HGB 10.8* 10.6* 10.3*    Recent Labs  01/19/13 0710 01/20/13 0611  WBC 10.8* 9.7  RBC 3.43* 3.34*  HCT 31.1* 30.5*  PLT 260 276    Recent Labs  01/18/13 0705  NA 138  K 4.0  CL 101  CO2 27  BUN 10  CREATININE 0.82  GLUCOSE 161*  CALCIUM 8.9    Recent Labs  01/19/13 0710 01/20/13 0611  INR 1.78* 1.87*    Assessment/Plan: Plan dc to snf sat - dc summary and rxes done   DEAN,GREGORY SCOTT 01/20/2013, 11:58 AM

## 2013-01-20 NOTE — Clinical Social Work Note (Addendum)
Clinical Social Work Department BRIEF PSYCHOSOCIAL ASSESSMENT 01/20/2013  Patient:  Dorothy Anderson, Dorothy Anderson     Account Number:  [id number]     Admit date:  01/17/2013  Clinical Social Worker:  Johnsie Cancel  Date/Time:  01/18/2013 02:38 PM  Referred by:  Physician  Date Referred:  01/17/2013 Referred for  SNF Placement   Other Referral:   Interview type:  Patient Other interview type:    PSYCHOSOCIAL DATA Living Status:  HUSBAND Admitted from facility:   Level of care:   Primary support name:  Cristal Generous Primary support relationship to patient:  SPOUSE Degree of support available:   Unknown, not at patient's bedside.    CURRENT CONCERNS Current Concerns  Post-Acute Placement   Other Concerns:    SOCIAL WORK ASSESSMENT / PLAN CSW consulted by MD re: SNF placement. CSW introduced self, role of CSW and provided support on hospital stay. Patient stated she wanted to go home at discharge and wanted to see how much she improved. Patient was agreeable to be faxed out to [facility] as a back up to going home at d/c. CSW will continue to follow patient for disposition needs.   Assessment/plan status:  Information/Referral to [facility] Other assessment/ plan:   Information/referral to community resources:    PATIENT'S/FAMILY'S RESPONSE TO PLAN OF CARE: Patient thanked CSW for support and assisting in a SNF search as a back up to going home.    Lia Foyer, LCSWA [facility] Clinical Social Worker Contact #: [phone]

## 2013-01-20 NOTE — Clinical Social Work Note (Addendum)
Clinical Social Work Department CLINICAL SOCIAL WORK PLACEMENT NOTE 01/20/2013  Patient:  Dorothy Anderson, Dorothy Anderson  Account Number:  [id number] Admit date:  01/17/2013  Clinical Social Worker:  Johnsie Cancel  Date/time:  01/20/2013 04:49 PM  Clinical Social Work is seeking post-discharge placement for this patient at the following level of care:   SKILLED NURSING   (*CSW will update this form in Epic as items are completed)   01/20/2013  Patient/family provided with [facility] Department of Clinical Social Work's list of facilities offering this level of care within the geographic area requested by the patient (or if unable, by the patient's family).  01/20/2013  Patient/family informed of their freedom to choose among providers that offer the needed level of care, that participate in Medicare, Medicaid or managed care program needed by the patient, have an available bed and are willing to accept the patient.  01/20/2013  Patient/family informed of [facility]' ownership interest in [facility], as well as of the fact that they are under no obligation to receive care at this facility.  PASARR submitted to EDS on 01/20/2013 PASARR number received from EDS on 01/20/2013  FL2 transmitted to all facilities in geographic area requested by pt/family on  01/20/2013 FL2 transmitted to all facilities within larger geographic area on   Patient informed that his/her managed care company has contracts with or will negotiate with  certain facilities, including the following:     Patient/family informed of bed offers received:  01/20/2013 Patient chooses bed at [facility] Physician recommends and patient chooses bed at  N/A  Patient to be transferred to [facility] on 01/21/2013 Patient to be transferred to facility by Patient's Spouse  The following physician request were entered in Epic:   Additional Comments:  Lia Foyer, LCSWA [facility] Clinical Social Worker Contact #: [phone]

## 2013-01-20 NOTE — Progress Notes (Signed)
Physical Therapy Treatment Patient Details Name: Dorothy Anderson MRN: [id number] DOB: 1941/09/03 Today's Date: 01/20/2013 Time: 1350-1406 PT Time Calculation (min): 16 min  PT Assessment / Plan / Recommendation Comments on Treatment Session  Patient was attemting to clean self and stand from [facility] without A from nursing when I entered the room. Patient returned to bed and educated on safety and importance of calling for assistance. Tech made aware. Patient able to increase therex this PM    Follow Up Recommendations  SNF     Does the patient have the potential to tolerate intense rehabilitation     Barriers to Discharge        Equipment Recommendations  None recommended by PT    Recommendations for Other Services    Frequency 7X/week   Plan Discharge plan remains appropriate;Frequency remains appropriate    Precautions / Restrictions Precautions Precautions: Fall;Knee Required Braces or Orthoses: Knee Immobilizer - Left Knee Immobilizer - Left: On except when in CPM Restrictions LLE Weight Bearing: Weight bearing as tolerated   Pertinent Vitals/Pain     Mobility  Bed Mobility Supine to Sit: 4: Min assist;HOB flat;With rails Sitting - Scoot to Edge of Bed: 4: Min assist Sit to Supine: 4: Min assist Details for Bed Mobility Assistance: Patient required A for LLE back into bed Transfers Sit to Stand: 3: Mod assist;With upper extremity assist;From chair/3-in-1;With armrests Stand to Sit: 4: Min assist;With upper extremity assist;With armrests;To bed Details for Transfer Assistance: A to iniitate stand and for stability as patient came into upright standing positioning. Cues for safe hand placement as patient wanting to continue to pull up on RW Ambulation/Gait Ambulation/Gait Assistance: 4: Min assist Ambulation Distance (Feet): 4 Feet Assistive device: Rolling walker Ambulation/Gait Assistance Details: Patient amb ~4 ft back to bed from [facility] Gait Pattern: Step-to  pattern;Decreased step length - right;Decreased step length - left;Decreased stride length;Decreased stance time - left Gait velocity: very decreased/     Exercises Total Joint Exercises Quad Sets: AROM;Left;10 reps Heel Slides: Left;10 reps;AAROM Hip ABduction/ADduction: AAROM;Left;10 reps Straight Leg Raises: AAROM;Left;5 reps   PT Diagnosis:    PT Problem List:   PT Treatment Interventions:     PT Goals Acute Rehab PT Goals PT Goal: Supine/Side to Sit - Progress: Progressing toward goal PT Goal: Sit at Edge Of Bed - Progress: Met PT Goal: Sit to Supine/Side - Progress: Progressing toward goal PT Goal: Stand to Sit - Progress: Progressing toward goal PT Transfer Goal: Bed to Chair/Chair to Bed - Progress: Progressing toward goal PT Goal: Stand - Progress: Progressing toward goal PT Goal: Ambulate - Progress: Progressing toward goal PT Goal: Perform Home Exercise Program - Progress: Progressing toward goal  Visit Information  Last PT Received On: 01/20/13 Assistance Needed: +2    Subjective Data      Cognition  Cognition Overall Cognitive Status: Appears within functional limits for tasks assessed/performed Arousal/Alertness: Awake/alert Orientation Level: Appears intact for tasks assessed Behavior During Session: [facility] for tasks performed    Balance     End of Session PT - End of Session Equipment Utilized During Treatment: Gait belt;Left knee immobilizer Activity Tolerance: Patient limited by pain;Patient limited by fatigue Patient left: in bed;with call bell/phone within reach Nurse Communication: Mobility status   GP     Fredrich Birks 01/20/2013, 2:12 PM 01/20/2013 Fredrich Birks PTA [phone] pager [phone] office

## 2013-01-20 NOTE — Progress Notes (Signed)
ANTICOAGULATION CONSULT NOTE - Follow Up Consult  Pharmacy Consult for coumadin  Indication: VTE prophylaxis  Allergies  Allergen Reactions  . Darvon Other (See Comments)    MADE PT STAY AWAKE   . Macrobid (Nitrofurantoin Monohydrate Macrocrystals) Nausea And Vomiting    Patient Measurements:     Vital Signs: Temp: 97.1 F (36.2 C) (03/14 0611) Temp src: Oral (03/14 0611) BP: 103/56 mmHg (03/14 0611) Pulse Rate: 89 (03/14 0611)  Labs:  Recent Labs  01/18/13 0705 01/19/13 0710 01/20/13 0611  HGB 10.8* 10.6* 10.3*  HCT 32.8* 31.1* 30.5*  PLT 306 260 276  LABPROT 15.0 20.1* 20.8*  INR 1.20 1.78* 1.87*  CREATININE 0.82  --   --     The CrCl is unknown because both a height and weight (above a minimum accepted value) are required for this calculation.   Assessment: 72 yo female s/p L TKA on coumadin for VTE prophylaxis. INR up to 1.87. CBC is stable and no bleeding noted.   Goal of Therapy:  INR 2-3 Monitor platelets by anticoagulation protocol: Yes   Plan:  Coumadin 2.5mg  po x 1 dose today  F/u PT/INR tomorrow   Thank you,  Brett Fairy, PharmD, BCPS 01/20/2013 10:57 AM

## 2013-01-20 NOTE — Progress Notes (Signed)
Physical Therapy Treatment Patient Details Name: Dorothy Anderson MRN: [id number] DOB: [birth year] Today's Date: 01/20/2013 Time: 4098-1191 PT Time Calculation (min): 39 min  PT Assessment / Plan / Recommendation Comments on Treatment Session  Patient presents with L TKA. Patient able to ambulate this morning. Still requires increased time for stands and ambulation. Patient still appears to be anxious with anticipatory pain.     Follow Up Recommendations  SNF     Does the patient have the potential to tolerate intense rehabilitation     Barriers to Discharge        Equipment Recommendations  None recommended by PT    Recommendations for Other Services    Frequency 7X/week   Plan Discharge plan remains appropriate;Frequency remains appropriate    Precautions / Restrictions Precautions Precautions: Fall;Knee Required Braces or Orthoses: Knee Immobilizer - Left Knee Immobilizer - Left: On except when in CPM Restrictions Weight Bearing Restrictions: Yes LLE Weight Bearing: Weight bearing as tolerated   Pertinent Vitals/Pain     Mobility  Bed Mobility Supine to Sit: 4: Min assist;HOB flat;With rails Sitting - Scoot to Edge of Bed: 4: Min assist Details for Bed Mobility Assistance: Patient required A for LLE out of bed and cues for technique  Transfers Sit to Stand: 3: Mod assist;From bed;With upper extremity assist;From chair/3-in-1;With armrests Stand to Sit: To chair/3-in-1;4: Min assist;With upper extremity assist;With armrests Details for Transfer Assistance: A to iniitate stand and for stability as patient came into upright standing positioning. Cues for best technique and not to fall back into recliner Ambulation/Gait Ambulation/Gait Assistance: 4: Min assist Ambulation Distance (Feet): 30 Feet (one seated rest break) Assistive device: Rolling walker Ambulation/Gait Assistance Details: Cues for gait sequence, management and positioning of RW. A to advance L LE and shift  weight to L side when advancing R LE Gait Pattern: Step-to pattern;Decreased step length - right;Decreased step length - left;Decreased stride length;Decreased stance time - left Gait velocity: very decreased/     Exercises Total Joint Exercises Quad Sets: AROM;Left;10 reps Heel Slides: Left;10 reps;AAROM Hip ABduction/ADduction: AAROM;Left;10 reps   PT Diagnosis:    PT Problem List:   PT Treatment Interventions:     PT Goals Acute Rehab PT Goals PT Goal: Supine/Side to Sit - Progress: Progressing toward goal PT Goal: Sit at Edge Of Bed - Progress: Met PT Goal: Stand to Sit - Progress: Progressing toward goal PT Transfer Goal: Bed to Chair/Chair to Bed - Progress: Progressing toward goal PT Goal: Stand - Progress: Progressing toward goal PT Goal: Ambulate - Progress: Progressing toward goal PT Goal: Perform Home Exercise Program - Progress: Progressing toward goal  Visit Information  Last PT Received On: 01/20/13 Assistance Needed: +2    Subjective Data      Cognition  Cognition Overall Cognitive Status: Appears within functional limits for tasks assessed/performed Arousal/Alertness: Awake/alert Orientation Level: Appears intact for tasks assessed Behavior During Session: [facility] for tasks performed    Balance     End of Session PT - End of Session Equipment Utilized During Treatment: Gait belt;Left knee immobilizer Activity Tolerance: Patient limited by pain;Patient limited by fatigue Patient left: in chair;with call bell/phone within reach;with family/visitor present Nurse Communication: Mobility status   GP     Fredrich Birks 01/20/2013, 11:48 AM 01/20/2013 Fredrich Birks PTA [phone] pager [phone] office

## 2013-01-21 ENCOUNTER — Encounter (HOSPITAL_COMMUNITY): Payer: Self-pay | Admitting: Orthopedic Surgery

## 2013-01-21 DIAGNOSIS — R262 Difficulty in walking, not elsewhere classified: Secondary | ICD-10-CM | POA: Diagnosis not present

## 2013-01-21 DIAGNOSIS — K219 Gastro-esophageal reflux disease without esophagitis: Secondary | ICD-10-CM | POA: Diagnosis not present

## 2013-01-21 DIAGNOSIS — M1710 Unilateral primary osteoarthritis, unspecified knee: Secondary | ICD-10-CM | POA: Diagnosis not present

## 2013-01-21 DIAGNOSIS — M6281 Muscle weakness (generalized): Secondary | ICD-10-CM | POA: Diagnosis not present

## 2013-01-21 DIAGNOSIS — Z7901 Long term (current) use of anticoagulants: Secondary | ICD-10-CM | POA: Diagnosis not present

## 2013-01-21 DIAGNOSIS — IMO0001 Reserved for inherently not codable concepts without codable children: Secondary | ICD-10-CM | POA: Diagnosis not present

## 2013-01-21 DIAGNOSIS — IMO0002 Reserved for concepts with insufficient information to code with codable children: Secondary | ICD-10-CM | POA: Diagnosis not present

## 2013-01-21 DIAGNOSIS — Z96659 Presence of unspecified artificial knee joint: Secondary | ICD-10-CM | POA: Diagnosis not present

## 2013-01-21 DIAGNOSIS — I10 Essential (primary) hypertension: Secondary | ICD-10-CM | POA: Diagnosis not present

## 2013-01-21 DIAGNOSIS — Z5189 Encounter for other specified aftercare: Secondary | ICD-10-CM | POA: Diagnosis not present

## 2013-01-21 DIAGNOSIS — R269 Unspecified abnormalities of gait and mobility: Secondary | ICD-10-CM | POA: Diagnosis not present

## 2013-01-21 DIAGNOSIS — M25569 Pain in unspecified knee: Secondary | ICD-10-CM | POA: Diagnosis not present

## 2013-01-21 LAB — GLUCOSE, CAPILLARY
Glucose-Capillary: 130 mg/dL — ABNORMAL HIGH (ref 70–99)
Glucose-Capillary: 172 mg/dL — ABNORMAL HIGH (ref 70–99)
Glucose-Capillary: 131 mg/dL — ABNORMAL HIGH (ref 70–99)

## 2013-01-21 LAB — PROTIME-INR
INR: 1.63 — ABNORMAL HIGH (ref 0.00–1.49)
Prothrombin Time: 18.8 seconds — ABNORMAL HIGH (ref 11.6–15.2)

## 2013-01-21 NOTE — Progress Notes (Signed)
Physical Therapy Treatment Patient Details Name: Dorothy Anderson MRN: [id number] DOB: 18-Mar-1941 Today's Date: 01/21/2013 Time: 4098-1191 PT Time Calculation (min): 31 min  PT Assessment / Plan / Recommendation Comments on Treatment Session  Pt very pleasant.  agreeable to SNF for further rehab.  Husband present for entire session.      Follow Up Recommendations  SNF     Does the patient have the potential to tolerate intense rehabilitation     Barriers to Discharge        Equipment Recommendations  None recommended by PT    Recommendations for Other Services    Frequency 7X/week   Plan Discharge plan remains appropriate;Frequency remains appropriate    Precautions / Restrictions Precautions Precautions: Fall;Knee Required Braces or Orthoses: Knee Immobilizer - Left Knee Immobilizer - Left: On except when in CPM Restrictions Weight Bearing Restrictions: Yes LLE Weight Bearing: Weight bearing as tolerated   Pertinent Vitals/Pain 6/10 in left knee at rest    Mobility  Bed Mobility Bed Mobility: Supine to Sit Supine to Sit: 4: Min assist;With rails;HOB elevated (assist with LLE) Transfers Transfers: Sit to Stand;Stand to Sit Sit to Stand: 3: Mod assist;With upper extremity assist;From bed;From chair/3-in-1 Stand to Sit: 4: Min assist;With upper extremity assist;To chair/3-in-1;Other (comment) (assist to let LLE out in front) Ambulation/Gait Ambulation/Gait Assistance: 4: Min assist Ambulation Distance (Feet): 8 Feet (performed twice-pt ambulated to bathroom and back) Assistive device: Rolling walker Ambulation/Gait Assistance Details: pt with difficulty clearing LLE from ground.  Physical assist provided. Gait Pattern: Step-to pattern Gait velocity: very decreased/     Exercises Total Joint Exercises Ankle Circles/Pumps: AROM;Both;5 reps Hip ABduction/ADduction: AAROM;Left;10 reps Straight Leg Raises: AAROM;Left;10 reps;Supine   PT Diagnosis:    PT Problem List:    PT Treatment Interventions:     PT Goals Acute Rehab PT Goals Pt will go Supine/Side to Sit: with modified independence PT Goal: Supine/Side to Sit - Progress: Progressing toward goal Pt will Sit at [facility] of Bed: with modified independence PT Goal: Sit at Edge Of Bed - Progress: Progressing toward goal Pt will go Sit to Supine/Side: with modified independence PT Goal: Sit to Supine/Side - Progress: Other (comment) Pt will go Stand to Sit: with supervision PT Goal: Stand to Sit - Progress: Progressing toward goal Pt will Transfer Bed to Chair/Chair to Bed: with supervision PT Transfer Goal: Bed to Chair/Chair to Bed - Progress: Progressing toward goal Pt will Stand: with supervision PT Goal: Stand - Progress: Progressing toward goal Pt will Ambulate: 51 - 150 feet;with supervision;with rolling walker PT Goal: Ambulate - Progress: Progressing toward goal Pt will Go Up / Down Stairs: 1-2 stairs;with supervision;with rail(s) PT Goal: Up/Down Stairs - Progress: Other (comment) Pt will Perform Home Exercise Program: Independently PT Goal: Perform Home Exercise Program - Progress: Progressing toward goal  Visit Information  Last PT Received On: 01/21/13 Assistance Needed: +1    Subjective Data      Cognition  Cognition Overall Cognitive Status: Appears within functional limits for tasks assessed/performed Arousal/Alertness: Awake/alert Orientation Level: Appears intact for tasks assessed Behavior During Session: [facility] for tasks performed    Balance     End of Session PT - End of Session Equipment Utilized During Treatment: Gait belt;Left knee immobilizer Activity Tolerance: Patient limited by pain Patient left: in chair;with call bell/phone within reach;with family/visitor present Nurse Communication: Mobility status CPM Left Knee CPM Left Knee: On Left Knee Flexion (Degrees): 45 Left Knee Extension (Degrees): 0   GP  Donnella Sham 01/21/2013, 11:16 AM Lavona Mound, PT  [phone] 01/21/2013

## 2013-01-21 NOTE — Clinical Social Work Note (Signed)
Clinical Social Worker facilitated discharge by contacting, spouse, Niamh Rada, and facility, [facility] regarding discharge today. Patient's husband will transport patient to facility. Discharge packet will be given to spouse. CSW will sign off, as social work intervention is no longer needed.   Verlon Au Evie Croston MSW, LCSWA (weekend coverage) [phone]

## 2013-01-21 NOTE — Discharge Summary (Signed)
Physician Discharge Summary  Patient ID: Dorothy Anderson MRN: [id number] DOB/AGE: January 30, 1941 72 y.o.  Admit date: 01/17/2013 Discharge date: 01/21/2013  Admission Diagnoses: Osteoarthritis left knee  Discharge Diagnoses: Osteoarthritis left knee Principal Problem:   Osteoarthritis of left knee   Discharged Condition: stable  Hospital Course: Patient's hospital course was essentially unremarkable. She underwent total knee arthroplasty. She progressed slowly with therapy and was discharged to skilled nursing in stable condition.  Consults: None  Significant Diagnostic Studies: labs: Routine labs  Treatments: surgery: See operative note  Discharge Exam: Blood pressure 142/55, pulse 83, temperature 98 F (36.7 C), temperature source Oral, resp. rate 18, SpO2 97.00%. Incision/Wound: dressing clean dry and intact at time of discharge.  Disposition: 01-Home or Self Care  Discharge Orders   Future Orders Complete By Expires     Call MD / Call 911  As directed     Comments:      If you experience chest pain or shortness of breath, CALL 911 and be transported to the hospital emergency room.  If you develope a fever above 101 F, pus (white drainage) or increased drainage or redness at the wound, or calf pain, call your surgeon's office.    Call MD / Call 911  As directed     Comments:      If you experience chest pain or shortness of breath, CALL 911 and be transported to the hospital emergency room.  If you develope a fever above 101 F, pus (white drainage) or increased drainage or redness at the wound, or calf pain, call your surgeon's office.    Constipation Prevention  As directed     Comments:      Drink plenty of fluids.  Prune juice may be helpful.  You may use a stool softener, such as Colace (over the counter) 100 mg twice a day.  Use MiraLax (over the counter) for constipation as needed.    Constipation Prevention  As directed     Comments:      Drink plenty of fluids.  Prune  juice may be helpful.  You may use a stool softener, such as Colace (over the counter) 100 mg twice a day.  Use MiraLax (over the counter) for constipation as needed.    Diet - low sodium heart healthy  As directed     Diet - low sodium heart healthy  As directed     Discharge instructions  As directed     Comments:      1. Weight bearing as tolerated 2. CPM and PT daily for range of motion and strengthening 3.Keep incision dry    Increase activity slowly as tolerated  As directed     Increase activity slowly as tolerated  As directed         Medication List    TAKE these medications       aspirin EC 81 MG tablet  Take 81 mg by mouth at bedtime.     CALCIUM-VITAMIN D PO  Take 1 tablet by mouth daily.     cholecalciferol 1000 UNITS tablet  Commonly known as:  VITAMIN D  Take 1,000 Units by mouth daily.     docusate sodium 100 MG capsule  Commonly known as:  COLACE  Take 100 mg by mouth 2 (two) times daily.     HYDROcodone-acetaminophen 5-500 MG per tablet  Commonly known as:  VICODIN  Take 1 tablet by mouth every 6 (six) hours as needed for pain. PAIN  HYDROcodone-acetaminophen 10-325 MG per tablet  Commonly known as:  NORCO  Take 1 tablet by mouth every 4 (four) hours as needed.     lansoprazole 15 MG capsule  Commonly known as:  PREVACID  Take 15 mg by mouth 2 (two) times daily.     latanoprost 0.005 % ophthalmic solution  Commonly known as:  XALATAN  Place 1 drop into both eyes at bedtime.     metFORMIN 500 MG 24 hr tablet  Commonly known as:  GLUCOPHAGE-XR  Take 500 mg by mouth daily with breakfast.     methocarbamol 500 MG tablet  Commonly known as:  ROBAXIN  Take 1 tablet (500 mg total) by mouth 3 (three) times daily.     metoprolol tartrate 25 MG tablet  Commonly known as:  LOPRESSOR  Take 25 mg by mouth 2 (two) times daily.     multivitamins ther. w/minerals Tabs  Take 1 tablet by mouth daily.     naproxen 500 MG tablet  Commonly known as:   NAPROSYN  Take 500 mg by mouth 2 (two) times daily with a meal.     warfarin 5 MG tablet  Commonly known as:  COUMADIN  Take 1 tablet (5 mg total) by mouth daily.           Follow-up Information   Follow up with Cammy Copa, MD In 2 weeks.   Contact information:   [address] [place] [address] [phone]       Signed: Nadara Mustard 01/21/2013, 6:59 AM

## 2013-01-23 DIAGNOSIS — Z96659 Presence of unspecified artificial knee joint: Secondary | ICD-10-CM | POA: Diagnosis not present

## 2013-01-23 DIAGNOSIS — K219 Gastro-esophageal reflux disease without esophagitis: Secondary | ICD-10-CM | POA: Diagnosis not present

## 2013-01-23 DIAGNOSIS — IMO0001 Reserved for inherently not codable concepts without codable children: Secondary | ICD-10-CM | POA: Diagnosis not present

## 2013-01-23 DIAGNOSIS — I10 Essential (primary) hypertension: Secondary | ICD-10-CM | POA: Diagnosis not present

## 2013-02-01 DIAGNOSIS — M1710 Unilateral primary osteoarthritis, unspecified knee: Secondary | ICD-10-CM | POA: Diagnosis not present

## 2013-02-01 DIAGNOSIS — Z96659 Presence of unspecified artificial knee joint: Secondary | ICD-10-CM | POA: Diagnosis not present

## 2013-02-08 DIAGNOSIS — Z96659 Presence of unspecified artificial knee joint: Secondary | ICD-10-CM | POA: Diagnosis not present

## 2013-02-08 DIAGNOSIS — IMO0001 Reserved for inherently not codable concepts without codable children: Secondary | ICD-10-CM | POA: Diagnosis not present

## 2013-02-08 DIAGNOSIS — I10 Essential (primary) hypertension: Secondary | ICD-10-CM | POA: Diagnosis not present

## 2013-02-08 DIAGNOSIS — K219 Gastro-esophageal reflux disease without esophagitis: Secondary | ICD-10-CM | POA: Diagnosis not present

## 2013-02-10 DIAGNOSIS — K219 Gastro-esophageal reflux disease without esophagitis: Secondary | ICD-10-CM | POA: Diagnosis not present

## 2013-02-10 DIAGNOSIS — Z7901 Long term (current) use of anticoagulants: Secondary | ICD-10-CM | POA: Diagnosis not present

## 2013-02-10 DIAGNOSIS — I10 Essential (primary) hypertension: Secondary | ICD-10-CM | POA: Diagnosis not present

## 2013-02-10 DIAGNOSIS — Z96659 Presence of unspecified artificial knee joint: Secondary | ICD-10-CM | POA: Diagnosis not present

## 2013-02-10 DIAGNOSIS — IMO0001 Reserved for inherently not codable concepts without codable children: Secondary | ICD-10-CM | POA: Diagnosis not present

## 2013-02-10 DIAGNOSIS — H409 Unspecified glaucoma: Secondary | ICD-10-CM | POA: Diagnosis not present

## 2013-02-10 DIAGNOSIS — Z471 Aftercare following joint replacement surgery: Secondary | ICD-10-CM | POA: Diagnosis not present

## 2013-02-10 DIAGNOSIS — Z5181 Encounter for therapeutic drug level monitoring: Secondary | ICD-10-CM | POA: Diagnosis not present

## 2013-02-13 DIAGNOSIS — Z471 Aftercare following joint replacement surgery: Secondary | ICD-10-CM | POA: Diagnosis not present

## 2013-02-13 DIAGNOSIS — H409 Unspecified glaucoma: Secondary | ICD-10-CM | POA: Diagnosis not present

## 2013-02-13 DIAGNOSIS — IMO0001 Reserved for inherently not codable concepts without codable children: Secondary | ICD-10-CM | POA: Diagnosis not present

## 2013-02-13 DIAGNOSIS — I10 Essential (primary) hypertension: Secondary | ICD-10-CM | POA: Diagnosis not present

## 2013-02-13 DIAGNOSIS — K219 Gastro-esophageal reflux disease without esophagitis: Secondary | ICD-10-CM | POA: Diagnosis not present

## 2013-02-13 DIAGNOSIS — Z96659 Presence of unspecified artificial knee joint: Secondary | ICD-10-CM | POA: Diagnosis not present

## 2013-02-14 DIAGNOSIS — K219 Gastro-esophageal reflux disease without esophagitis: Secondary | ICD-10-CM | POA: Diagnosis not present

## 2013-02-14 DIAGNOSIS — IMO0001 Reserved for inherently not codable concepts without codable children: Secondary | ICD-10-CM | POA: Diagnosis not present

## 2013-02-14 DIAGNOSIS — Z96659 Presence of unspecified artificial knee joint: Secondary | ICD-10-CM | POA: Diagnosis not present

## 2013-02-14 DIAGNOSIS — Z471 Aftercare following joint replacement surgery: Secondary | ICD-10-CM | POA: Diagnosis not present

## 2013-02-14 DIAGNOSIS — H409 Unspecified glaucoma: Secondary | ICD-10-CM | POA: Diagnosis not present

## 2013-02-14 DIAGNOSIS — I10 Essential (primary) hypertension: Secondary | ICD-10-CM | POA: Diagnosis not present

## 2013-02-15 DIAGNOSIS — K219 Gastro-esophageal reflux disease without esophagitis: Secondary | ICD-10-CM | POA: Diagnosis not present

## 2013-02-15 DIAGNOSIS — Z471 Aftercare following joint replacement surgery: Secondary | ICD-10-CM | POA: Diagnosis not present

## 2013-02-15 DIAGNOSIS — H409 Unspecified glaucoma: Secondary | ICD-10-CM | POA: Diagnosis not present

## 2013-02-15 DIAGNOSIS — Z96659 Presence of unspecified artificial knee joint: Secondary | ICD-10-CM | POA: Diagnosis not present

## 2013-02-15 DIAGNOSIS — IMO0001 Reserved for inherently not codable concepts without codable children: Secondary | ICD-10-CM | POA: Diagnosis not present

## 2013-02-15 DIAGNOSIS — I10 Essential (primary) hypertension: Secondary | ICD-10-CM | POA: Diagnosis not present

## 2013-02-17 DIAGNOSIS — Z96659 Presence of unspecified artificial knee joint: Secondary | ICD-10-CM | POA: Diagnosis not present

## 2013-02-17 DIAGNOSIS — K219 Gastro-esophageal reflux disease without esophagitis: Secondary | ICD-10-CM | POA: Diagnosis not present

## 2013-02-17 DIAGNOSIS — IMO0001 Reserved for inherently not codable concepts without codable children: Secondary | ICD-10-CM | POA: Diagnosis not present

## 2013-02-17 DIAGNOSIS — Z471 Aftercare following joint replacement surgery: Secondary | ICD-10-CM | POA: Diagnosis not present

## 2013-02-17 DIAGNOSIS — H409 Unspecified glaucoma: Secondary | ICD-10-CM | POA: Diagnosis not present

## 2013-02-17 DIAGNOSIS — I10 Essential (primary) hypertension: Secondary | ICD-10-CM | POA: Diagnosis not present

## 2013-02-20 DIAGNOSIS — Z96659 Presence of unspecified artificial knee joint: Secondary | ICD-10-CM | POA: Diagnosis not present

## 2013-02-20 DIAGNOSIS — K219 Gastro-esophageal reflux disease without esophagitis: Secondary | ICD-10-CM | POA: Diagnosis not present

## 2013-02-20 DIAGNOSIS — I10 Essential (primary) hypertension: Secondary | ICD-10-CM | POA: Diagnosis not present

## 2013-02-20 DIAGNOSIS — Z471 Aftercare following joint replacement surgery: Secondary | ICD-10-CM | POA: Diagnosis not present

## 2013-02-20 DIAGNOSIS — H409 Unspecified glaucoma: Secondary | ICD-10-CM | POA: Diagnosis not present

## 2013-02-20 DIAGNOSIS — IMO0001 Reserved for inherently not codable concepts without codable children: Secondary | ICD-10-CM | POA: Diagnosis not present

## 2013-02-22 DIAGNOSIS — H409 Unspecified glaucoma: Secondary | ICD-10-CM | POA: Diagnosis not present

## 2013-02-22 DIAGNOSIS — I10 Essential (primary) hypertension: Secondary | ICD-10-CM | POA: Diagnosis not present

## 2013-02-22 DIAGNOSIS — Z96659 Presence of unspecified artificial knee joint: Secondary | ICD-10-CM | POA: Diagnosis not present

## 2013-02-22 DIAGNOSIS — IMO0001 Reserved for inherently not codable concepts without codable children: Secondary | ICD-10-CM | POA: Diagnosis not present

## 2013-02-22 DIAGNOSIS — Z471 Aftercare following joint replacement surgery: Secondary | ICD-10-CM | POA: Diagnosis not present

## 2013-02-22 DIAGNOSIS — K219 Gastro-esophageal reflux disease without esophagitis: Secondary | ICD-10-CM | POA: Diagnosis not present

## 2013-02-23 DIAGNOSIS — H409 Unspecified glaucoma: Secondary | ICD-10-CM | POA: Diagnosis not present

## 2013-02-23 DIAGNOSIS — I10 Essential (primary) hypertension: Secondary | ICD-10-CM | POA: Diagnosis not present

## 2013-02-23 DIAGNOSIS — Z471 Aftercare following joint replacement surgery: Secondary | ICD-10-CM | POA: Diagnosis not present

## 2013-02-23 DIAGNOSIS — IMO0001 Reserved for inherently not codable concepts without codable children: Secondary | ICD-10-CM | POA: Diagnosis not present

## 2013-02-23 DIAGNOSIS — K219 Gastro-esophageal reflux disease without esophagitis: Secondary | ICD-10-CM | POA: Diagnosis not present

## 2013-02-23 DIAGNOSIS — Z96659 Presence of unspecified artificial knee joint: Secondary | ICD-10-CM | POA: Diagnosis not present

## 2013-02-28 DIAGNOSIS — I10 Essential (primary) hypertension: Secondary | ICD-10-CM | POA: Diagnosis not present

## 2013-02-28 DIAGNOSIS — Z471 Aftercare following joint replacement surgery: Secondary | ICD-10-CM | POA: Diagnosis not present

## 2013-02-28 DIAGNOSIS — H409 Unspecified glaucoma: Secondary | ICD-10-CM | POA: Diagnosis not present

## 2013-02-28 DIAGNOSIS — K219 Gastro-esophageal reflux disease without esophagitis: Secondary | ICD-10-CM | POA: Diagnosis not present

## 2013-02-28 DIAGNOSIS — IMO0001 Reserved for inherently not codable concepts without codable children: Secondary | ICD-10-CM | POA: Diagnosis not present

## 2013-02-28 DIAGNOSIS — Z96659 Presence of unspecified artificial knee joint: Secondary | ICD-10-CM | POA: Diagnosis not present

## 2013-03-01 ENCOUNTER — Other Ambulatory Visit: Payer: Self-pay | Admitting: Orthopedic Surgery

## 2013-03-01 DIAGNOSIS — M79605 Pain in left leg: Secondary | ICD-10-CM

## 2013-03-01 DIAGNOSIS — M7989 Other specified soft tissue disorders: Secondary | ICD-10-CM

## 2013-03-02 ENCOUNTER — Ambulatory Visit
Admission: RE | Admit: 2013-03-02 | Discharge: 2013-03-02 | Disposition: A | Discharge status: Home / Self Care | Payer: Medicare Other | Source: Ambulatory Visit | Attending: Orthopedic Surgery | Admitting: Orthopedic Surgery

## 2013-03-02 DIAGNOSIS — H409 Unspecified glaucoma: Secondary | ICD-10-CM | POA: Diagnosis not present

## 2013-03-02 DIAGNOSIS — M7989 Other specified soft tissue disorders: Secondary | ICD-10-CM | POA: Diagnosis not present

## 2013-03-02 DIAGNOSIS — K219 Gastro-esophageal reflux disease without esophagitis: Secondary | ICD-10-CM | POA: Diagnosis not present

## 2013-03-02 DIAGNOSIS — M79605 Pain in left leg: Secondary | ICD-10-CM

## 2013-03-02 DIAGNOSIS — I10 Essential (primary) hypertension: Secondary | ICD-10-CM | POA: Diagnosis not present

## 2013-03-02 DIAGNOSIS — IMO0001 Reserved for inherently not codable concepts without codable children: Secondary | ICD-10-CM | POA: Diagnosis not present

## 2013-03-02 DIAGNOSIS — Z96659 Presence of unspecified artificial knee joint: Secondary | ICD-10-CM | POA: Diagnosis not present

## 2013-03-02 DIAGNOSIS — Z471 Aftercare following joint replacement surgery: Secondary | ICD-10-CM | POA: Diagnosis not present

## 2013-03-03 DIAGNOSIS — H409 Unspecified glaucoma: Secondary | ICD-10-CM | POA: Diagnosis not present

## 2013-03-03 DIAGNOSIS — K219 Gastro-esophageal reflux disease without esophagitis: Secondary | ICD-10-CM | POA: Diagnosis not present

## 2013-03-03 DIAGNOSIS — I10 Essential (primary) hypertension: Secondary | ICD-10-CM | POA: Diagnosis not present

## 2013-03-03 DIAGNOSIS — Z96659 Presence of unspecified artificial knee joint: Secondary | ICD-10-CM | POA: Diagnosis not present

## 2013-03-03 DIAGNOSIS — Z471 Aftercare following joint replacement surgery: Secondary | ICD-10-CM | POA: Diagnosis not present

## 2013-03-03 DIAGNOSIS — IMO0001 Reserved for inherently not codable concepts without codable children: Secondary | ICD-10-CM | POA: Diagnosis not present

## 2013-03-07 DIAGNOSIS — Z96659 Presence of unspecified artificial knee joint: Secondary | ICD-10-CM | POA: Diagnosis not present

## 2013-03-07 DIAGNOSIS — I10 Essential (primary) hypertension: Secondary | ICD-10-CM | POA: Diagnosis not present

## 2013-03-07 DIAGNOSIS — Z471 Aftercare following joint replacement surgery: Secondary | ICD-10-CM | POA: Diagnosis not present

## 2013-03-07 DIAGNOSIS — H409 Unspecified glaucoma: Secondary | ICD-10-CM | POA: Diagnosis not present

## 2013-03-07 DIAGNOSIS — IMO0001 Reserved for inherently not codable concepts without codable children: Secondary | ICD-10-CM | POA: Diagnosis not present

## 2013-03-07 DIAGNOSIS — K219 Gastro-esophageal reflux disease without esophagitis: Secondary | ICD-10-CM | POA: Diagnosis not present

## 2013-03-08 DIAGNOSIS — Z471 Aftercare following joint replacement surgery: Secondary | ICD-10-CM | POA: Diagnosis not present

## 2013-03-08 DIAGNOSIS — Z96659 Presence of unspecified artificial knee joint: Secondary | ICD-10-CM | POA: Diagnosis not present

## 2013-03-08 DIAGNOSIS — K219 Gastro-esophageal reflux disease without esophagitis: Secondary | ICD-10-CM | POA: Diagnosis not present

## 2013-03-08 DIAGNOSIS — H409 Unspecified glaucoma: Secondary | ICD-10-CM | POA: Diagnosis not present

## 2013-03-08 DIAGNOSIS — I10 Essential (primary) hypertension: Secondary | ICD-10-CM | POA: Diagnosis not present

## 2013-03-08 DIAGNOSIS — IMO0001 Reserved for inherently not codable concepts without codable children: Secondary | ICD-10-CM | POA: Diagnosis not present

## 2013-03-09 DIAGNOSIS — K219 Gastro-esophageal reflux disease without esophagitis: Secondary | ICD-10-CM | POA: Diagnosis not present

## 2013-03-09 DIAGNOSIS — I10 Essential (primary) hypertension: Secondary | ICD-10-CM | POA: Diagnosis not present

## 2013-03-09 DIAGNOSIS — Z471 Aftercare following joint replacement surgery: Secondary | ICD-10-CM | POA: Diagnosis not present

## 2013-03-09 DIAGNOSIS — H409 Unspecified glaucoma: Secondary | ICD-10-CM | POA: Diagnosis not present

## 2013-03-09 DIAGNOSIS — IMO0001 Reserved for inherently not codable concepts without codable children: Secondary | ICD-10-CM | POA: Diagnosis not present

## 2013-03-09 DIAGNOSIS — Z96659 Presence of unspecified artificial knee joint: Secondary | ICD-10-CM | POA: Diagnosis not present

## 2013-03-13 DIAGNOSIS — I10 Essential (primary) hypertension: Secondary | ICD-10-CM | POA: Diagnosis not present

## 2013-03-13 DIAGNOSIS — Z471 Aftercare following joint replacement surgery: Secondary | ICD-10-CM | POA: Diagnosis not present

## 2013-03-13 DIAGNOSIS — H409 Unspecified glaucoma: Secondary | ICD-10-CM | POA: Diagnosis not present

## 2013-03-13 DIAGNOSIS — IMO0001 Reserved for inherently not codable concepts without codable children: Secondary | ICD-10-CM | POA: Diagnosis not present

## 2013-03-13 DIAGNOSIS — K219 Gastro-esophageal reflux disease without esophagitis: Secondary | ICD-10-CM | POA: Diagnosis not present

## 2013-03-13 DIAGNOSIS — Z96659 Presence of unspecified artificial knee joint: Secondary | ICD-10-CM | POA: Diagnosis not present

## 2013-03-14 DIAGNOSIS — Z96659 Presence of unspecified artificial knee joint: Secondary | ICD-10-CM | POA: Diagnosis not present

## 2013-03-14 DIAGNOSIS — I10 Essential (primary) hypertension: Secondary | ICD-10-CM | POA: Diagnosis not present

## 2013-03-14 DIAGNOSIS — IMO0001 Reserved for inherently not codable concepts without codable children: Secondary | ICD-10-CM | POA: Diagnosis not present

## 2013-03-14 DIAGNOSIS — K219 Gastro-esophageal reflux disease without esophagitis: Secondary | ICD-10-CM | POA: Diagnosis not present

## 2013-03-14 DIAGNOSIS — Z471 Aftercare following joint replacement surgery: Secondary | ICD-10-CM | POA: Diagnosis not present

## 2013-03-14 DIAGNOSIS — H409 Unspecified glaucoma: Secondary | ICD-10-CM | POA: Diagnosis not present

## 2013-03-16 DIAGNOSIS — K219 Gastro-esophageal reflux disease without esophagitis: Secondary | ICD-10-CM | POA: Diagnosis not present

## 2013-03-16 DIAGNOSIS — IMO0001 Reserved for inherently not codable concepts without codable children: Secondary | ICD-10-CM | POA: Diagnosis not present

## 2013-03-16 DIAGNOSIS — Z96659 Presence of unspecified artificial knee joint: Secondary | ICD-10-CM | POA: Diagnosis not present

## 2013-03-16 DIAGNOSIS — I10 Essential (primary) hypertension: Secondary | ICD-10-CM | POA: Diagnosis not present

## 2013-03-16 DIAGNOSIS — Z471 Aftercare following joint replacement surgery: Secondary | ICD-10-CM | POA: Diagnosis not present

## 2013-03-16 DIAGNOSIS — H409 Unspecified glaucoma: Secondary | ICD-10-CM | POA: Diagnosis not present

## 2013-03-20 DIAGNOSIS — M6281 Muscle weakness (generalized): Secondary | ICD-10-CM | POA: Diagnosis not present

## 2013-03-20 DIAGNOSIS — R269 Unspecified abnormalities of gait and mobility: Secondary | ICD-10-CM | POA: Diagnosis not present

## 2013-03-20 DIAGNOSIS — M009 Pyogenic arthritis, unspecified: Secondary | ICD-10-CM | POA: Diagnosis not present

## 2013-03-20 DIAGNOSIS — Z96659 Presence of unspecified artificial knee joint: Secondary | ICD-10-CM | POA: Diagnosis not present

## 2013-03-20 DIAGNOSIS — I10 Essential (primary) hypertension: Secondary | ICD-10-CM | POA: Diagnosis not present

## 2013-03-20 DIAGNOSIS — Z8739 Personal history of other diseases of the musculoskeletal system and connective tissue: Secondary | ICD-10-CM | POA: Diagnosis not present

## 2013-03-22 DIAGNOSIS — Z8739 Personal history of other diseases of the musculoskeletal system and connective tissue: Secondary | ICD-10-CM | POA: Diagnosis not present

## 2013-03-22 DIAGNOSIS — M6281 Muscle weakness (generalized): Secondary | ICD-10-CM | POA: Diagnosis not present

## 2013-03-22 DIAGNOSIS — M009 Pyogenic arthritis, unspecified: Secondary | ICD-10-CM | POA: Diagnosis not present

## 2013-03-22 DIAGNOSIS — R269 Unspecified abnormalities of gait and mobility: Secondary | ICD-10-CM | POA: Diagnosis not present

## 2013-03-22 DIAGNOSIS — Z96659 Presence of unspecified artificial knee joint: Secondary | ICD-10-CM | POA: Diagnosis not present

## 2013-03-22 DIAGNOSIS — I10 Essential (primary) hypertension: Secondary | ICD-10-CM | POA: Diagnosis not present

## 2013-03-24 DIAGNOSIS — Z8739 Personal history of other diseases of the musculoskeletal system and connective tissue: Secondary | ICD-10-CM | POA: Diagnosis not present

## 2013-03-24 DIAGNOSIS — R269 Unspecified abnormalities of gait and mobility: Secondary | ICD-10-CM | POA: Diagnosis not present

## 2013-03-24 DIAGNOSIS — M009 Pyogenic arthritis, unspecified: Secondary | ICD-10-CM | POA: Diagnosis not present

## 2013-03-24 DIAGNOSIS — Z96659 Presence of unspecified artificial knee joint: Secondary | ICD-10-CM | POA: Diagnosis not present

## 2013-03-24 DIAGNOSIS — M6281 Muscle weakness (generalized): Secondary | ICD-10-CM | POA: Diagnosis not present

## 2013-03-24 DIAGNOSIS — I10 Essential (primary) hypertension: Secondary | ICD-10-CM | POA: Diagnosis not present

## 2013-03-27 DIAGNOSIS — M009 Pyogenic arthritis, unspecified: Secondary | ICD-10-CM | POA: Diagnosis not present

## 2013-03-27 DIAGNOSIS — R269 Unspecified abnormalities of gait and mobility: Secondary | ICD-10-CM | POA: Diagnosis not present

## 2013-03-27 DIAGNOSIS — Z8739 Personal history of other diseases of the musculoskeletal system and connective tissue: Secondary | ICD-10-CM | POA: Diagnosis not present

## 2013-03-27 DIAGNOSIS — Z96659 Presence of unspecified artificial knee joint: Secondary | ICD-10-CM | POA: Diagnosis not present

## 2013-03-27 DIAGNOSIS — I10 Essential (primary) hypertension: Secondary | ICD-10-CM | POA: Diagnosis not present

## 2013-03-27 DIAGNOSIS — M6281 Muscle weakness (generalized): Secondary | ICD-10-CM | POA: Diagnosis not present

## 2013-03-29 DIAGNOSIS — M6281 Muscle weakness (generalized): Secondary | ICD-10-CM | POA: Diagnosis not present

## 2013-03-29 DIAGNOSIS — N200 Calculus of kidney: Secondary | ICD-10-CM | POA: Diagnosis not present

## 2013-03-29 DIAGNOSIS — M009 Pyogenic arthritis, unspecified: Secondary | ICD-10-CM | POA: Diagnosis not present

## 2013-03-29 DIAGNOSIS — R269 Unspecified abnormalities of gait and mobility: Secondary | ICD-10-CM | POA: Diagnosis not present

## 2013-03-29 DIAGNOSIS — Z8739 Personal history of other diseases of the musculoskeletal system and connective tissue: Secondary | ICD-10-CM | POA: Diagnosis not present

## 2013-03-29 DIAGNOSIS — Z96659 Presence of unspecified artificial knee joint: Secondary | ICD-10-CM | POA: Diagnosis not present

## 2013-03-29 DIAGNOSIS — I10 Essential (primary) hypertension: Secondary | ICD-10-CM | POA: Diagnosis not present

## 2013-03-30 DIAGNOSIS — Z8739 Personal history of other diseases of the musculoskeletal system and connective tissue: Secondary | ICD-10-CM | POA: Diagnosis not present

## 2013-03-30 DIAGNOSIS — Z96659 Presence of unspecified artificial knee joint: Secondary | ICD-10-CM | POA: Diagnosis not present

## 2013-03-30 DIAGNOSIS — M6281 Muscle weakness (generalized): Secondary | ICD-10-CM | POA: Diagnosis not present

## 2013-03-30 DIAGNOSIS — M009 Pyogenic arthritis, unspecified: Secondary | ICD-10-CM | POA: Diagnosis not present

## 2013-03-30 DIAGNOSIS — I10 Essential (primary) hypertension: Secondary | ICD-10-CM | POA: Diagnosis not present

## 2013-03-30 DIAGNOSIS — R269 Unspecified abnormalities of gait and mobility: Secondary | ICD-10-CM | POA: Diagnosis not present

## 2013-04-03 DIAGNOSIS — I10 Essential (primary) hypertension: Secondary | ICD-10-CM | POA: Diagnosis not present

## 2013-04-03 DIAGNOSIS — R269 Unspecified abnormalities of gait and mobility: Secondary | ICD-10-CM | POA: Diagnosis not present

## 2013-04-03 DIAGNOSIS — Z8739 Personal history of other diseases of the musculoskeletal system and connective tissue: Secondary | ICD-10-CM | POA: Diagnosis not present

## 2013-04-03 DIAGNOSIS — M6281 Muscle weakness (generalized): Secondary | ICD-10-CM | POA: Diagnosis not present

## 2013-04-03 DIAGNOSIS — Z96659 Presence of unspecified artificial knee joint: Secondary | ICD-10-CM | POA: Diagnosis not present

## 2013-04-03 DIAGNOSIS — M009 Pyogenic arthritis, unspecified: Secondary | ICD-10-CM | POA: Diagnosis not present

## 2013-04-05 DIAGNOSIS — R269 Unspecified abnormalities of gait and mobility: Secondary | ICD-10-CM | POA: Diagnosis not present

## 2013-04-05 DIAGNOSIS — Z8739 Personal history of other diseases of the musculoskeletal system and connective tissue: Secondary | ICD-10-CM | POA: Diagnosis not present

## 2013-04-05 DIAGNOSIS — I10 Essential (primary) hypertension: Secondary | ICD-10-CM | POA: Diagnosis not present

## 2013-04-05 DIAGNOSIS — Z96659 Presence of unspecified artificial knee joint: Secondary | ICD-10-CM | POA: Diagnosis not present

## 2013-04-05 DIAGNOSIS — M009 Pyogenic arthritis, unspecified: Secondary | ICD-10-CM | POA: Diagnosis not present

## 2013-04-05 DIAGNOSIS — M6281 Muscle weakness (generalized): Secondary | ICD-10-CM | POA: Diagnosis not present

## 2013-04-06 DIAGNOSIS — Z8739 Personal history of other diseases of the musculoskeletal system and connective tissue: Secondary | ICD-10-CM | POA: Diagnosis not present

## 2013-04-06 DIAGNOSIS — Z96659 Presence of unspecified artificial knee joint: Secondary | ICD-10-CM | POA: Diagnosis not present

## 2013-04-06 DIAGNOSIS — I10 Essential (primary) hypertension: Secondary | ICD-10-CM | POA: Diagnosis not present

## 2013-04-06 DIAGNOSIS — M6281 Muscle weakness (generalized): Secondary | ICD-10-CM | POA: Diagnosis not present

## 2013-04-06 DIAGNOSIS — R269 Unspecified abnormalities of gait and mobility: Secondary | ICD-10-CM | POA: Diagnosis not present

## 2013-04-06 DIAGNOSIS — M009 Pyogenic arthritis, unspecified: Secondary | ICD-10-CM | POA: Diagnosis not present

## 2013-04-10 DIAGNOSIS — M009 Pyogenic arthritis, unspecified: Secondary | ICD-10-CM | POA: Diagnosis not present

## 2013-04-10 DIAGNOSIS — Z8739 Personal history of other diseases of the musculoskeletal system and connective tissue: Secondary | ICD-10-CM | POA: Diagnosis not present

## 2013-04-10 DIAGNOSIS — I10 Essential (primary) hypertension: Secondary | ICD-10-CM | POA: Diagnosis not present

## 2013-04-10 DIAGNOSIS — R269 Unspecified abnormalities of gait and mobility: Secondary | ICD-10-CM | POA: Diagnosis not present

## 2013-04-10 DIAGNOSIS — Z96659 Presence of unspecified artificial knee joint: Secondary | ICD-10-CM | POA: Diagnosis not present

## 2013-04-11 DIAGNOSIS — M009 Pyogenic arthritis, unspecified: Secondary | ICD-10-CM | POA: Diagnosis not present

## 2013-04-11 DIAGNOSIS — I10 Essential (primary) hypertension: Secondary | ICD-10-CM | POA: Diagnosis not present

## 2013-04-11 DIAGNOSIS — Z8739 Personal history of other diseases of the musculoskeletal system and connective tissue: Secondary | ICD-10-CM | POA: Diagnosis not present

## 2013-04-11 DIAGNOSIS — Z96659 Presence of unspecified artificial knee joint: Secondary | ICD-10-CM | POA: Diagnosis not present

## 2013-04-11 DIAGNOSIS — R269 Unspecified abnormalities of gait and mobility: Secondary | ICD-10-CM | POA: Diagnosis not present

## 2013-04-17 DIAGNOSIS — R269 Unspecified abnormalities of gait and mobility: Secondary | ICD-10-CM | POA: Diagnosis not present

## 2013-04-17 DIAGNOSIS — I10 Essential (primary) hypertension: Secondary | ICD-10-CM | POA: Diagnosis not present

## 2013-04-17 DIAGNOSIS — Z8739 Personal history of other diseases of the musculoskeletal system and connective tissue: Secondary | ICD-10-CM | POA: Diagnosis not present

## 2013-04-17 DIAGNOSIS — M009 Pyogenic arthritis, unspecified: Secondary | ICD-10-CM | POA: Diagnosis not present

## 2013-04-17 DIAGNOSIS — Z96659 Presence of unspecified artificial knee joint: Secondary | ICD-10-CM | POA: Diagnosis not present

## 2013-04-19 DIAGNOSIS — M009 Pyogenic arthritis, unspecified: Secondary | ICD-10-CM | POA: Diagnosis not present

## 2013-04-19 DIAGNOSIS — R269 Unspecified abnormalities of gait and mobility: Secondary | ICD-10-CM | POA: Diagnosis not present

## 2013-04-19 DIAGNOSIS — Z8739 Personal history of other diseases of the musculoskeletal system and connective tissue: Secondary | ICD-10-CM | POA: Diagnosis not present

## 2013-04-19 DIAGNOSIS — Z96659 Presence of unspecified artificial knee joint: Secondary | ICD-10-CM | POA: Diagnosis not present

## 2013-04-19 DIAGNOSIS — I10 Essential (primary) hypertension: Secondary | ICD-10-CM | POA: Diagnosis not present

## 2013-06-08 DIAGNOSIS — IMO0002 Reserved for concepts with insufficient information to code with codable children: Secondary | ICD-10-CM | POA: Diagnosis not present

## 2013-06-08 DIAGNOSIS — M25519 Pain in unspecified shoulder: Secondary | ICD-10-CM | POA: Diagnosis not present

## 2013-06-08 DIAGNOSIS — M159 Polyosteoarthritis, unspecified: Secondary | ICD-10-CM | POA: Diagnosis not present

## 2013-06-08 DIAGNOSIS — E119 Type 2 diabetes mellitus without complications: Secondary | ICD-10-CM | POA: Diagnosis not present

## 2013-06-08 DIAGNOSIS — H26499 Other secondary cataract, unspecified eye: Secondary | ICD-10-CM | POA: Diagnosis not present

## 2013-07-05 DIAGNOSIS — N182 Chronic kidney disease, stage 2 (mild): Secondary | ICD-10-CM | POA: Diagnosis not present

## 2013-07-05 DIAGNOSIS — E1129 Type 2 diabetes mellitus with other diabetic kidney complication: Secondary | ICD-10-CM | POA: Diagnosis not present

## 2013-07-05 DIAGNOSIS — I10 Essential (primary) hypertension: Secondary | ICD-10-CM | POA: Diagnosis not present

## 2013-07-05 DIAGNOSIS — M899 Disorder of bone, unspecified: Secondary | ICD-10-CM | POA: Diagnosis not present

## 2013-07-13 DIAGNOSIS — M899 Disorder of bone, unspecified: Secondary | ICD-10-CM | POA: Diagnosis not present

## 2013-07-13 DIAGNOSIS — E1129 Type 2 diabetes mellitus with other diabetic kidney complication: Secondary | ICD-10-CM | POA: Diagnosis not present

## 2013-07-13 DIAGNOSIS — I10 Essential (primary) hypertension: Secondary | ICD-10-CM | POA: Diagnosis not present

## 2013-07-13 DIAGNOSIS — N182 Chronic kidney disease, stage 2 (mild): Secondary | ICD-10-CM | POA: Diagnosis not present

## 2013-08-18 DIAGNOSIS — Z23 Encounter for immunization: Secondary | ICD-10-CM | POA: Diagnosis not present

## 2013-12-07 DIAGNOSIS — H26499 Other secondary cataract, unspecified eye: Secondary | ICD-10-CM | POA: Diagnosis not present

## 2013-12-07 DIAGNOSIS — E119 Type 2 diabetes mellitus without complications: Secondary | ICD-10-CM | POA: Diagnosis not present

## 2013-12-07 DIAGNOSIS — IMO0002 Reserved for concepts with insufficient information to code with codable children: Secondary | ICD-10-CM | POA: Diagnosis not present

## 2013-12-07 DIAGNOSIS — M159 Polyosteoarthritis, unspecified: Secondary | ICD-10-CM | POA: Diagnosis not present

## 2013-12-13 DIAGNOSIS — M25569 Pain in unspecified knee: Secondary | ICD-10-CM | POA: Diagnosis not present

## 2013-12-13 DIAGNOSIS — M1710 Unilateral primary osteoarthritis, unspecified knee: Secondary | ICD-10-CM | POA: Diagnosis not present

## 2014-01-10 DIAGNOSIS — N390 Urinary tract infection, site not specified: Secondary | ICD-10-CM | POA: Diagnosis not present

## 2014-01-10 DIAGNOSIS — M899 Disorder of bone, unspecified: Secondary | ICD-10-CM | POA: Diagnosis not present

## 2014-01-10 DIAGNOSIS — Z1331 Encounter for screening for depression: Secondary | ICD-10-CM | POA: Diagnosis not present

## 2014-01-10 DIAGNOSIS — I10 Essential (primary) hypertension: Secondary | ICD-10-CM | POA: Diagnosis not present

## 2014-01-10 DIAGNOSIS — E1129 Type 2 diabetes mellitus with other diabetic kidney complication: Secondary | ICD-10-CM | POA: Diagnosis not present

## 2014-01-10 DIAGNOSIS — Z0000 Encounter for general adult medical examination without abnormal findings: Secondary | ICD-10-CM | POA: Diagnosis not present

## 2014-01-17 DIAGNOSIS — M1710 Unilateral primary osteoarthritis, unspecified knee: Secondary | ICD-10-CM | POA: Diagnosis not present

## 2014-01-18 DIAGNOSIS — E1129 Type 2 diabetes mellitus with other diabetic kidney complication: Secondary | ICD-10-CM | POA: Diagnosis not present

## 2014-01-18 DIAGNOSIS — I10 Essential (primary) hypertension: Secondary | ICD-10-CM | POA: Diagnosis not present

## 2014-01-18 DIAGNOSIS — H4011X0 Primary open-angle glaucoma, stage unspecified: Secondary | ICD-10-CM | POA: Diagnosis not present

## 2014-01-18 DIAGNOSIS — N182 Chronic kidney disease, stage 2 (mild): Secondary | ICD-10-CM | POA: Diagnosis not present

## 2014-01-18 DIAGNOSIS — H409 Unspecified glaucoma: Secondary | ICD-10-CM | POA: Diagnosis not present

## 2014-01-18 DIAGNOSIS — M949 Disorder of cartilage, unspecified: Secondary | ICD-10-CM | POA: Diagnosis not present

## 2014-01-18 DIAGNOSIS — M899 Disorder of bone, unspecified: Secondary | ICD-10-CM | POA: Diagnosis not present

## 2014-01-18 DIAGNOSIS — K219 Gastro-esophageal reflux disease without esophagitis: Secondary | ICD-10-CM | POA: Diagnosis not present

## 2014-06-07 DIAGNOSIS — H02839 Dermatochalasis of unspecified eye, unspecified eyelid: Secondary | ICD-10-CM | POA: Diagnosis not present

## 2014-06-07 DIAGNOSIS — M159 Polyosteoarthritis, unspecified: Secondary | ICD-10-CM | POA: Diagnosis not present

## 2014-06-07 DIAGNOSIS — H35379 Puckering of macula, unspecified eye: Secondary | ICD-10-CM | POA: Diagnosis not present

## 2014-06-07 DIAGNOSIS — H26499 Other secondary cataract, unspecified eye: Secondary | ICD-10-CM | POA: Diagnosis not present

## 2014-06-07 DIAGNOSIS — IMO0002 Reserved for concepts with insufficient information to code with codable children: Secondary | ICD-10-CM | POA: Diagnosis not present

## 2014-06-14 DIAGNOSIS — H26499 Other secondary cataract, unspecified eye: Secondary | ICD-10-CM | POA: Diagnosis not present

## 2014-07-11 DIAGNOSIS — M949 Disorder of cartilage, unspecified: Secondary | ICD-10-CM | POA: Diagnosis not present

## 2014-07-11 DIAGNOSIS — I10 Essential (primary) hypertension: Secondary | ICD-10-CM | POA: Diagnosis not present

## 2014-07-11 DIAGNOSIS — M899 Disorder of bone, unspecified: Secondary | ICD-10-CM | POA: Diagnosis not present

## 2014-07-11 DIAGNOSIS — E1129 Type 2 diabetes mellitus with other diabetic kidney complication: Secondary | ICD-10-CM | POA: Diagnosis not present

## 2014-07-20 DIAGNOSIS — N182 Chronic kidney disease, stage 2 (mild): Secondary | ICD-10-CM | POA: Diagnosis not present

## 2014-07-20 DIAGNOSIS — Z23 Encounter for immunization: Secondary | ICD-10-CM | POA: Diagnosis not present

## 2014-07-20 DIAGNOSIS — M899 Disorder of bone, unspecified: Secondary | ICD-10-CM | POA: Diagnosis not present

## 2014-07-20 DIAGNOSIS — I10 Essential (primary) hypertension: Secondary | ICD-10-CM | POA: Diagnosis not present

## 2014-07-20 DIAGNOSIS — M949 Disorder of cartilage, unspecified: Secondary | ICD-10-CM | POA: Diagnosis not present

## 2014-07-20 DIAGNOSIS — E1129 Type 2 diabetes mellitus with other diabetic kidney complication: Secondary | ICD-10-CM | POA: Diagnosis not present

## 2014-09-26 IMAGING — CR DG KNEE 1-2V*L*
2 series · 2 of 2 positions shown · non-contrast
Comparison: None.

CLINICAL DATA: Preop total knee arthroplasty

LEFT KNEE - 1-2 VIEW

[view not recorded (1 of 2)]
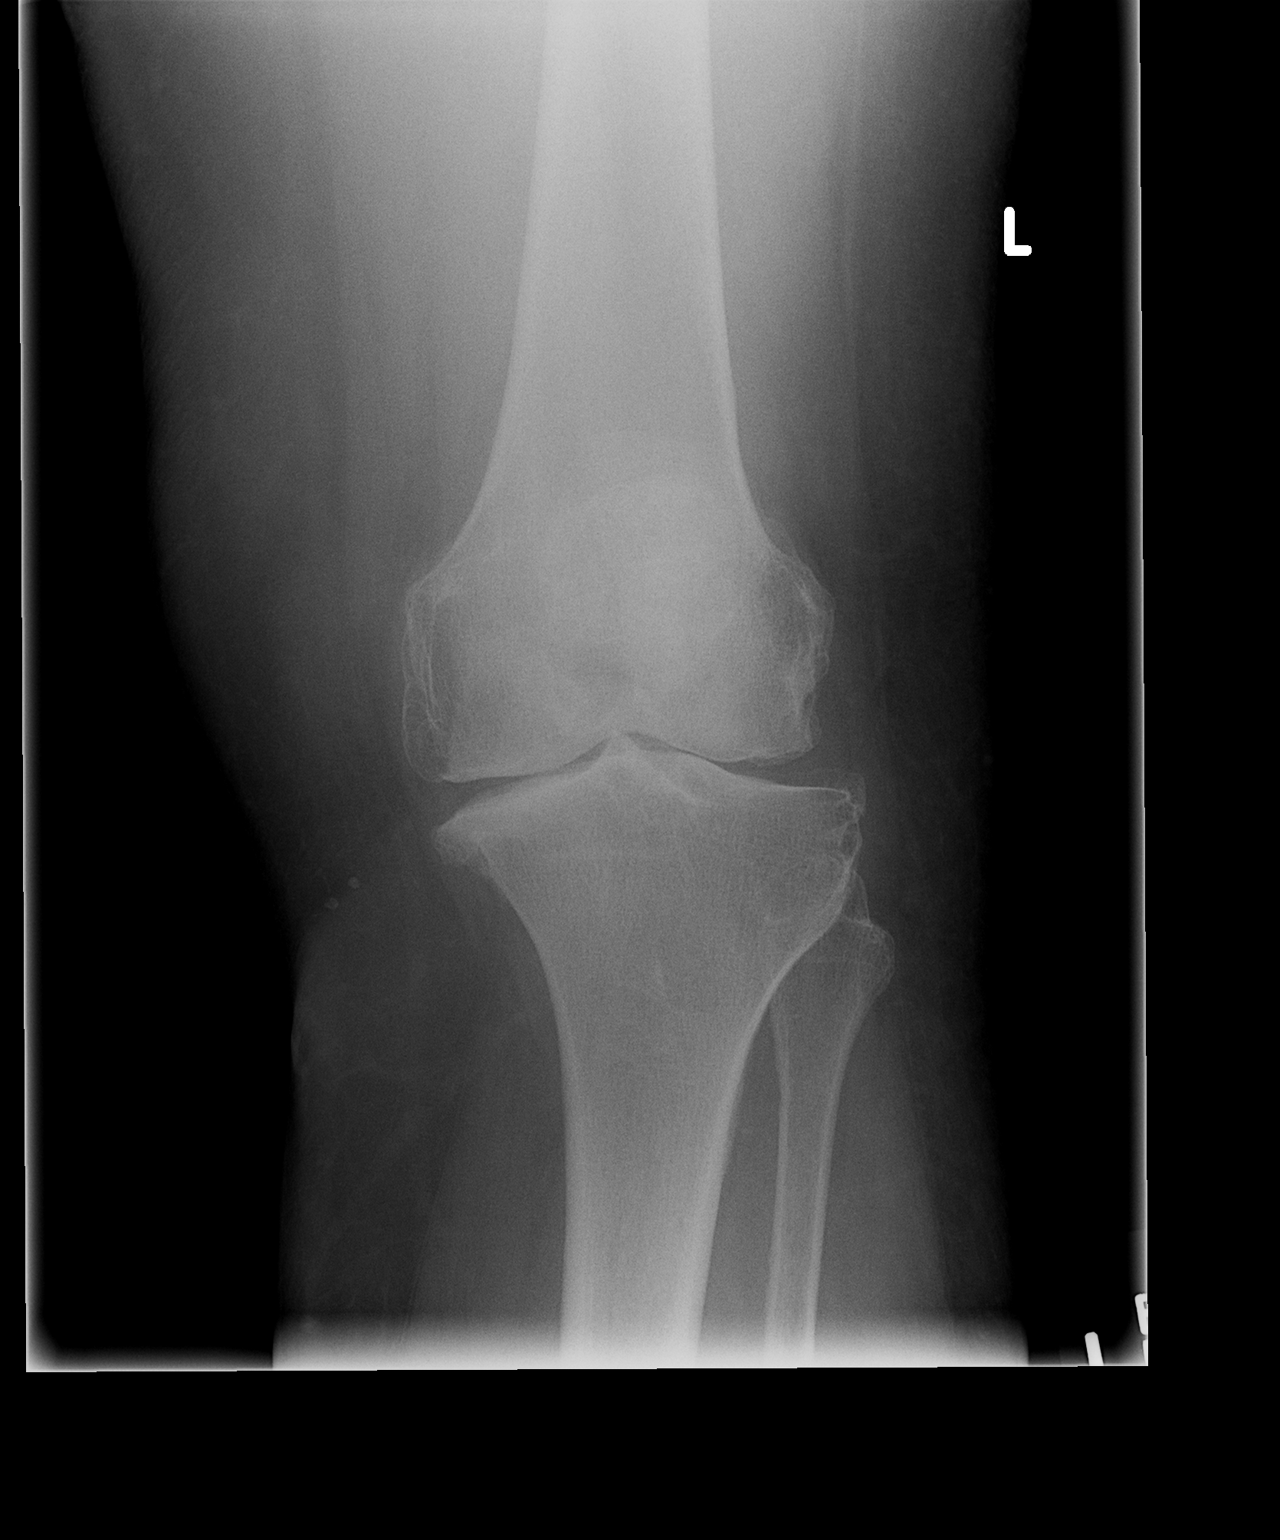

[view not recorded (2 of 2)]
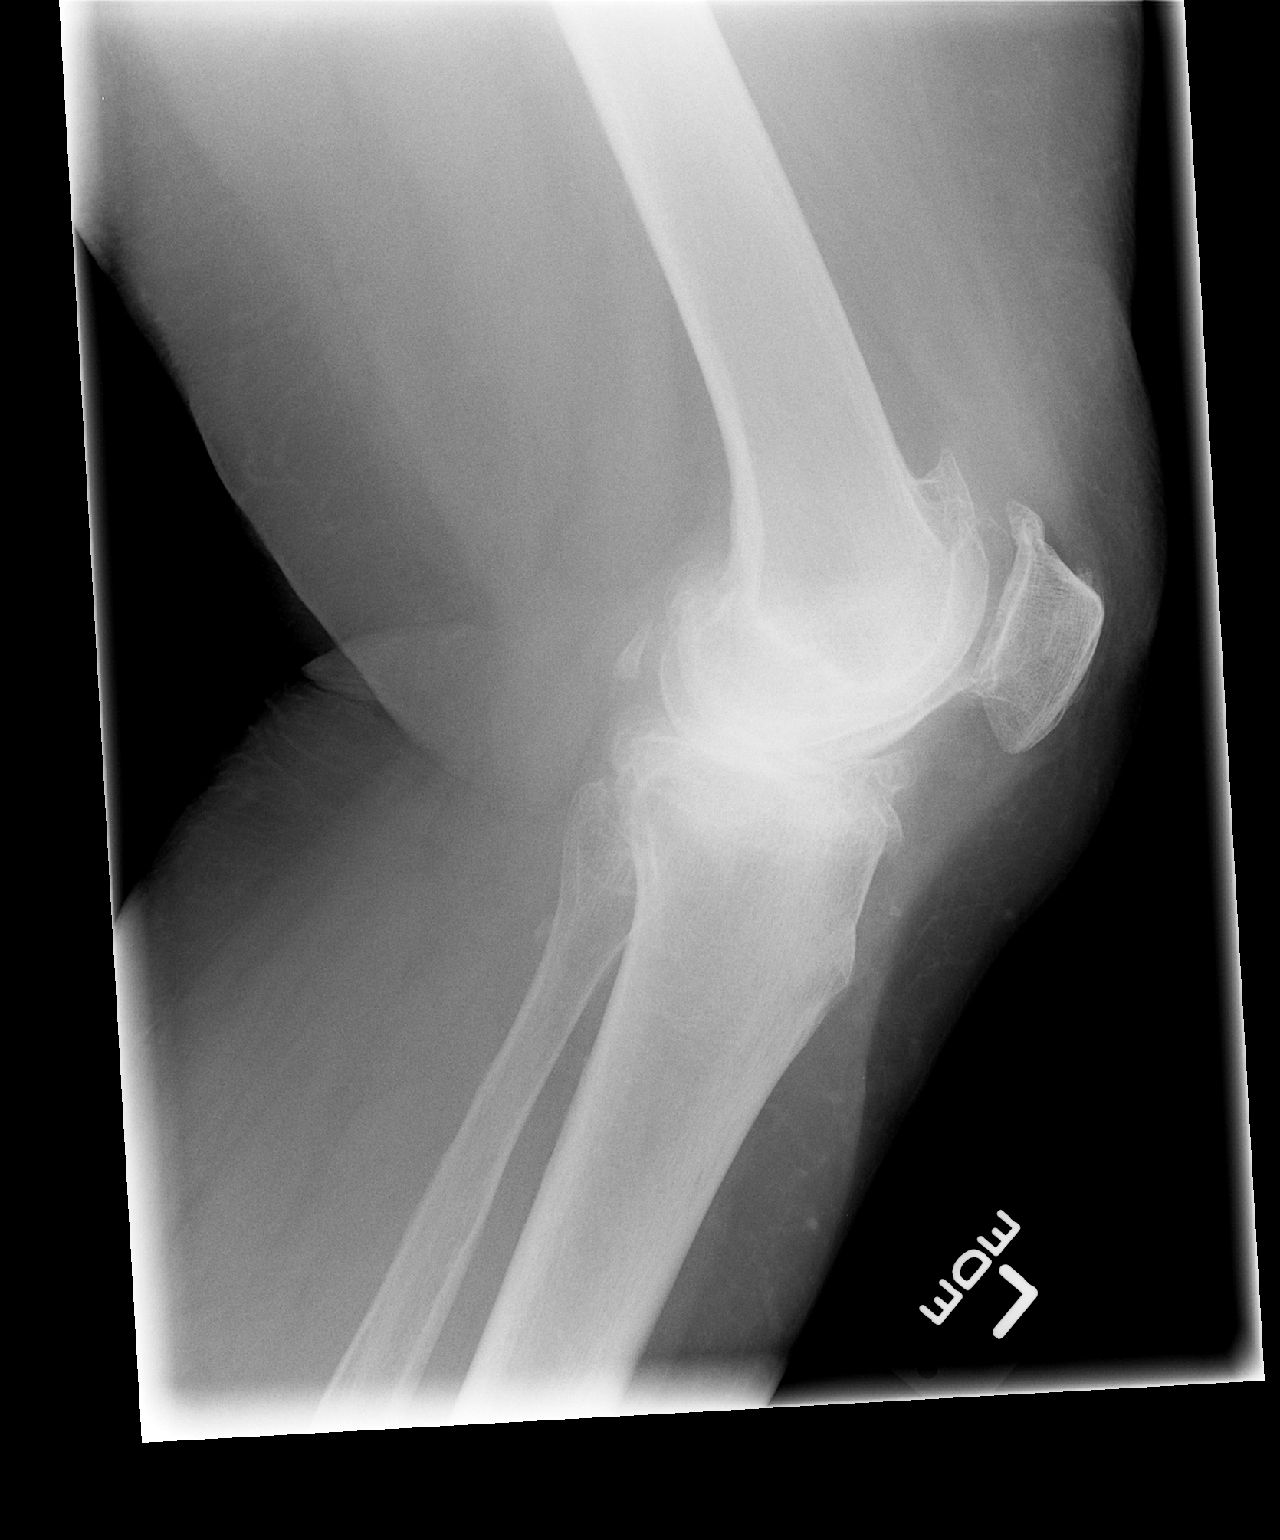

[2 of 2 positions shown; findings below may reference images not displayed]

FINDINGS: Advanced tricompartmental osteoarthritis.  Joint space
narrowing is most significant in the medial compartment.  There are
large osteophytes in the patellofemoral compartment.  There is a
small associated suprapatellar joint effusion.  No evidence for
acute fracture, malalignment or aggressive osseous lesions.
IMPRESSION: Advanced tricompartmental osteoarthritis worst in the
patellofemoral and medial compartments.

Small joint effusion.

## 2014-10-25 DIAGNOSIS — H02834 Dermatochalasis of left upper eyelid: Secondary | ICD-10-CM | POA: Diagnosis not present

## 2014-10-25 DIAGNOSIS — I10 Essential (primary) hypertension: Secondary | ICD-10-CM | POA: Insufficient documentation

## 2014-10-25 DIAGNOSIS — H02831 Dermatochalasis of right upper eyelid: Secondary | ICD-10-CM | POA: Diagnosis not present

## 2014-10-25 DIAGNOSIS — H02422 Myogenic ptosis of left eyelid: Secondary | ICD-10-CM | POA: Diagnosis not present

## 2014-10-25 DIAGNOSIS — H02421 Myogenic ptosis of right eyelid: Secondary | ICD-10-CM | POA: Diagnosis not present

## 2014-10-25 DIAGNOSIS — E119 Type 2 diabetes mellitus without complications: Secondary | ICD-10-CM | POA: Insufficient documentation

## 2014-12-10 DIAGNOSIS — M150 Primary generalized (osteo)arthritis: Secondary | ICD-10-CM | POA: Diagnosis not present

## 2014-12-10 DIAGNOSIS — M5136 Other intervertebral disc degeneration, lumbar region: Secondary | ICD-10-CM | POA: Diagnosis not present

## 2014-12-14 DIAGNOSIS — N3000 Acute cystitis without hematuria: Secondary | ICD-10-CM | POA: Diagnosis not present

## 2014-12-14 DIAGNOSIS — N3281 Overactive bladder: Secondary | ICD-10-CM | POA: Diagnosis not present

## 2014-12-31 DIAGNOSIS — H4011X2 Primary open-angle glaucoma, moderate stage: Secondary | ICD-10-CM | POA: Diagnosis not present

## 2015-01-14 DIAGNOSIS — I10 Essential (primary) hypertension: Secondary | ICD-10-CM | POA: Diagnosis not present

## 2015-01-14 DIAGNOSIS — M859 Disorder of bone density and structure, unspecified: Secondary | ICD-10-CM | POA: Diagnosis not present

## 2015-01-14 DIAGNOSIS — Z1389 Encounter for screening for other disorder: Secondary | ICD-10-CM | POA: Diagnosis not present

## 2015-01-14 DIAGNOSIS — N390 Urinary tract infection, site not specified: Secondary | ICD-10-CM | POA: Diagnosis not present

## 2015-01-14 DIAGNOSIS — Z23 Encounter for immunization: Secondary | ICD-10-CM | POA: Diagnosis not present

## 2015-01-14 DIAGNOSIS — Z0000 Encounter for general adult medical examination without abnormal findings: Secondary | ICD-10-CM | POA: Diagnosis not present

## 2015-01-14 DIAGNOSIS — E1329 Other specified diabetes mellitus with other diabetic kidney complication: Secondary | ICD-10-CM | POA: Diagnosis not present

## 2015-01-21 DIAGNOSIS — I10 Essential (primary) hypertension: Secondary | ICD-10-CM | POA: Diagnosis not present

## 2015-01-21 DIAGNOSIS — K219 Gastro-esophageal reflux disease without esophagitis: Secondary | ICD-10-CM | POA: Diagnosis not present

## 2015-01-21 DIAGNOSIS — E1122 Type 2 diabetes mellitus with diabetic chronic kidney disease: Secondary | ICD-10-CM | POA: Diagnosis not present

## 2015-01-21 DIAGNOSIS — N182 Chronic kidney disease, stage 2 (mild): Secondary | ICD-10-CM | POA: Diagnosis not present

## 2015-01-21 DIAGNOSIS — M810 Age-related osteoporosis without current pathological fracture: Secondary | ICD-10-CM | POA: Diagnosis not present

## 2015-01-21 DIAGNOSIS — K7689 Other specified diseases of liver: Secondary | ICD-10-CM | POA: Diagnosis not present

## 2015-01-22 ENCOUNTER — Other Ambulatory Visit: Payer: Self-pay | Admitting: Internal Medicine

## 2015-01-22 DIAGNOSIS — K7689 Other specified diseases of liver: Secondary | ICD-10-CM

## 2015-01-25 DIAGNOSIS — Z96653 Presence of artificial knee joint, bilateral: Secondary | ICD-10-CM | POA: Diagnosis not present

## 2015-01-25 DIAGNOSIS — M25562 Pain in left knee: Secondary | ICD-10-CM | POA: Diagnosis not present

## 2015-01-25 DIAGNOSIS — R350 Frequency of micturition: Secondary | ICD-10-CM | POA: Diagnosis not present

## 2015-01-25 DIAGNOSIS — N3941 Urge incontinence: Secondary | ICD-10-CM | POA: Diagnosis not present

## 2015-01-25 DIAGNOSIS — R3915 Urgency of urination: Secondary | ICD-10-CM | POA: Diagnosis not present

## 2015-01-28 DIAGNOSIS — Z79899 Other long term (current) drug therapy: Secondary | ICD-10-CM | POA: Diagnosis not present

## 2015-01-28 DIAGNOSIS — Z01818 Encounter for other preprocedural examination: Secondary | ICD-10-CM | POA: Diagnosis not present

## 2015-01-28 DIAGNOSIS — H02403 Unspecified ptosis of bilateral eyelids: Secondary | ICD-10-CM | POA: Insufficient documentation

## 2015-01-29 ENCOUNTER — Ambulatory Visit
Admission: RE | Admit: 2015-01-29 | Discharge: 2015-01-29 | Disposition: A | Discharge status: Home / Self Care | Payer: Medicare Other | Source: Ambulatory Visit | Attending: Internal Medicine | Admitting: Internal Medicine

## 2015-01-29 DIAGNOSIS — K7689 Other specified diseases of liver: Secondary | ICD-10-CM | POA: Diagnosis not present

## 2015-01-29 DIAGNOSIS — K838 Other specified diseases of biliary tract: Secondary | ICD-10-CM | POA: Diagnosis not present

## 2015-02-12 DIAGNOSIS — H02831 Dermatochalasis of right upper eyelid: Secondary | ICD-10-CM | POA: Diagnosis not present

## 2015-02-12 DIAGNOSIS — H02834 Dermatochalasis of left upper eyelid: Secondary | ICD-10-CM | POA: Diagnosis not present

## 2015-02-12 DIAGNOSIS — E119 Type 2 diabetes mellitus without complications: Secondary | ICD-10-CM | POA: Diagnosis not present

## 2015-02-27 DIAGNOSIS — N390 Urinary tract infection, site not specified: Secondary | ICD-10-CM | POA: Diagnosis not present

## 2015-02-27 DIAGNOSIS — N3281 Overactive bladder: Secondary | ICD-10-CM | POA: Diagnosis not present

## 2015-04-29 DIAGNOSIS — H4011X1 Primary open-angle glaucoma, mild stage: Secondary | ICD-10-CM | POA: Diagnosis not present

## 2015-04-29 DIAGNOSIS — H35373 Puckering of macula, bilateral: Secondary | ICD-10-CM | POA: Diagnosis not present

## 2015-06-13 DIAGNOSIS — M150 Primary generalized (osteo)arthritis: Secondary | ICD-10-CM | POA: Diagnosis not present

## 2015-06-13 DIAGNOSIS — M8580 Other specified disorders of bone density and structure, unspecified site: Secondary | ICD-10-CM | POA: Diagnosis not present

## 2015-06-13 DIAGNOSIS — N182 Chronic kidney disease, stage 2 (mild): Secondary | ICD-10-CM | POA: Diagnosis not present

## 2015-07-22 DIAGNOSIS — M8580 Other specified disorders of bone density and structure, unspecified site: Secondary | ICD-10-CM | POA: Diagnosis not present

## 2015-07-22 DIAGNOSIS — I10 Essential (primary) hypertension: Secondary | ICD-10-CM | POA: Diagnosis not present

## 2015-07-22 DIAGNOSIS — E559 Vitamin D deficiency, unspecified: Secondary | ICD-10-CM | POA: Diagnosis not present

## 2015-07-22 DIAGNOSIS — E1122 Type 2 diabetes mellitus with diabetic chronic kidney disease: Secondary | ICD-10-CM | POA: Diagnosis not present

## 2015-07-26 DIAGNOSIS — I129 Hypertensive chronic kidney disease with stage 1 through stage 4 chronic kidney disease, or unspecified chronic kidney disease: Secondary | ICD-10-CM | POA: Diagnosis not present

## 2015-07-26 DIAGNOSIS — M8580 Other specified disorders of bone density and structure, unspecified site: Secondary | ICD-10-CM | POA: Diagnosis not present

## 2015-07-26 DIAGNOSIS — Z23 Encounter for immunization: Secondary | ICD-10-CM | POA: Diagnosis not present

## 2015-07-26 DIAGNOSIS — E1122 Type 2 diabetes mellitus with diabetic chronic kidney disease: Secondary | ICD-10-CM | POA: Diagnosis not present

## 2015-07-26 DIAGNOSIS — N182 Chronic kidney disease, stage 2 (mild): Secondary | ICD-10-CM | POA: Diagnosis not present

## 2015-08-08 DIAGNOSIS — M545 Low back pain: Secondary | ICD-10-CM | POA: Diagnosis not present

## 2015-08-08 DIAGNOSIS — G894 Chronic pain syndrome: Secondary | ICD-10-CM | POA: Diagnosis not present

## 2015-08-08 DIAGNOSIS — Z79899 Other long term (current) drug therapy: Secondary | ICD-10-CM | POA: Diagnosis not present

## 2015-08-08 DIAGNOSIS — R269 Unspecified abnormalities of gait and mobility: Secondary | ICD-10-CM | POA: Diagnosis not present

## 2015-08-13 ENCOUNTER — Other Ambulatory Visit: Payer: Self-pay | Admitting: Pain Medicine

## 2015-08-13 ENCOUNTER — Ambulatory Visit
Admission: RE | Admit: 2015-08-13 | Discharge: 2015-08-13 | Disposition: A | Discharge status: Home / Self Care | Payer: Medicare Other | Source: Ambulatory Visit | Attending: Pain Medicine | Admitting: Pain Medicine

## 2015-08-13 DIAGNOSIS — M545 Low back pain: Secondary | ICD-10-CM

## 2015-08-13 DIAGNOSIS — M47816 Spondylosis without myelopathy or radiculopathy, lumbar region: Secondary | ICD-10-CM | POA: Diagnosis not present

## 2015-09-05 DIAGNOSIS — H401131 Primary open-angle glaucoma, bilateral, mild stage: Secondary | ICD-10-CM | POA: Diagnosis not present

## 2015-09-05 DIAGNOSIS — R269 Unspecified abnormalities of gait and mobility: Secondary | ICD-10-CM | POA: Diagnosis not present

## 2015-09-05 DIAGNOSIS — M545 Low back pain: Secondary | ICD-10-CM | POA: Diagnosis not present

## 2015-09-05 DIAGNOSIS — G894 Chronic pain syndrome: Secondary | ICD-10-CM | POA: Diagnosis not present

## 2015-09-09 DIAGNOSIS — N200 Calculus of kidney: Secondary | ICD-10-CM | POA: Diagnosis not present

## 2015-09-09 DIAGNOSIS — N3281 Overactive bladder: Secondary | ICD-10-CM | POA: Diagnosis not present

## 2015-09-09 DIAGNOSIS — N390 Urinary tract infection, site not specified: Secondary | ICD-10-CM | POA: Diagnosis not present

## 2015-09-12 DIAGNOSIS — M545 Low back pain: Secondary | ICD-10-CM | POA: Diagnosis not present

## 2015-09-12 DIAGNOSIS — M8580 Other specified disorders of bone density and structure, unspecified site: Secondary | ICD-10-CM | POA: Diagnosis not present

## 2015-09-12 DIAGNOSIS — M47817 Spondylosis without myelopathy or radiculopathy, lumbosacral region: Secondary | ICD-10-CM | POA: Diagnosis not present

## 2015-09-12 DIAGNOSIS — M150 Primary generalized (osteo)arthritis: Secondary | ICD-10-CM | POA: Diagnosis not present

## 2015-09-12 DIAGNOSIS — G894 Chronic pain syndrome: Secondary | ICD-10-CM | POA: Diagnosis not present

## 2015-09-12 DIAGNOSIS — N182 Chronic kidney disease, stage 2 (mild): Secondary | ICD-10-CM | POA: Diagnosis not present

## 2015-10-10 DIAGNOSIS — M545 Low back pain: Secondary | ICD-10-CM | POA: Diagnosis not present

## 2015-10-10 DIAGNOSIS — M47817 Spondylosis without myelopathy or radiculopathy, lumbosacral region: Secondary | ICD-10-CM | POA: Diagnosis not present

## 2015-10-10 DIAGNOSIS — G894 Chronic pain syndrome: Secondary | ICD-10-CM | POA: Diagnosis not present

## 2015-12-05 DIAGNOSIS — G894 Chronic pain syndrome: Secondary | ICD-10-CM | POA: Diagnosis not present

## 2015-12-05 DIAGNOSIS — M545 Low back pain: Secondary | ICD-10-CM | POA: Diagnosis not present

## 2015-12-05 DIAGNOSIS — Z79899 Other long term (current) drug therapy: Secondary | ICD-10-CM | POA: Diagnosis not present

## 2015-12-05 DIAGNOSIS — M47817 Spondylosis without myelopathy or radiculopathy, lumbosacral region: Secondary | ICD-10-CM | POA: Diagnosis not present

## 2015-12-16 DIAGNOSIS — H6120 Impacted cerumen, unspecified ear: Secondary | ICD-10-CM | POA: Diagnosis not present

## 2015-12-16 DIAGNOSIS — J019 Acute sinusitis, unspecified: Secondary | ICD-10-CM | POA: Diagnosis not present

## 2016-01-09 DIAGNOSIS — H35373 Puckering of macula, bilateral: Secondary | ICD-10-CM | POA: Diagnosis not present

## 2016-01-09 DIAGNOSIS — H401131 Primary open-angle glaucoma, bilateral, mild stage: Secondary | ICD-10-CM | POA: Diagnosis not present

## 2016-01-09 DIAGNOSIS — Z961 Presence of intraocular lens: Secondary | ICD-10-CM | POA: Diagnosis not present

## 2016-01-27 DIAGNOSIS — E559 Vitamin D deficiency, unspecified: Secondary | ICD-10-CM | POA: Diagnosis not present

## 2016-01-27 DIAGNOSIS — M8580 Other specified disorders of bone density and structure, unspecified site: Secondary | ICD-10-CM | POA: Diagnosis not present

## 2016-01-27 DIAGNOSIS — Z1389 Encounter for screening for other disorder: Secondary | ICD-10-CM | POA: Diagnosis not present

## 2016-01-27 DIAGNOSIS — I129 Hypertensive chronic kidney disease with stage 1 through stage 4 chronic kidney disease, or unspecified chronic kidney disease: Secondary | ICD-10-CM | POA: Diagnosis not present

## 2016-01-27 DIAGNOSIS — Z0000 Encounter for general adult medical examination without abnormal findings: Secondary | ICD-10-CM | POA: Diagnosis not present

## 2016-01-27 DIAGNOSIS — E1122 Type 2 diabetes mellitus with diabetic chronic kidney disease: Secondary | ICD-10-CM | POA: Diagnosis not present

## 2016-01-30 DIAGNOSIS — E1122 Type 2 diabetes mellitus with diabetic chronic kidney disease: Secondary | ICD-10-CM | POA: Diagnosis not present

## 2016-01-30 DIAGNOSIS — M47817 Spondylosis without myelopathy or radiculopathy, lumbosacral region: Secondary | ICD-10-CM | POA: Diagnosis not present

## 2016-01-30 DIAGNOSIS — G894 Chronic pain syndrome: Secondary | ICD-10-CM | POA: Diagnosis not present

## 2016-01-30 DIAGNOSIS — M545 Low back pain: Secondary | ICD-10-CM | POA: Diagnosis not present

## 2016-01-30 DIAGNOSIS — N183 Chronic kidney disease, stage 3 (moderate): Secondary | ICD-10-CM | POA: Diagnosis not present

## 2016-01-30 DIAGNOSIS — I129 Hypertensive chronic kidney disease with stage 1 through stage 4 chronic kidney disease, or unspecified chronic kidney disease: Secondary | ICD-10-CM | POA: Diagnosis not present

## 2016-01-30 DIAGNOSIS — M8580 Other specified disorders of bone density and structure, unspecified site: Secondary | ICD-10-CM | POA: Diagnosis not present

## 2016-01-30 DIAGNOSIS — K219 Gastro-esophageal reflux disease without esophagitis: Secondary | ICD-10-CM | POA: Diagnosis not present

## 2016-02-06 ENCOUNTER — Encounter (INDEPENDENT_AMBULATORY_CARE_PROVIDER_SITE_OTHER): Payer: Medicare Other

## 2016-02-06 DIAGNOSIS — R9431 Abnormal electrocardiogram [ECG] [EKG]: Secondary | ICD-10-CM | POA: Diagnosis not present

## 2016-02-06 LAB — EXERCISE TOLERANCE TEST
Estimated workload: 3.2 METS
Exercise duration (min): 1 min
Exercise duration (sec): 4 s
MPHR: 144 {beats}/min
Peak HR: 102 {beats}/min
Percent HR: 78 %
Percent of predicted max HR: 70 %
RPE: 15
Rest HR: 67 {beats}/min
Stage 1 Grade: 0 %
Stage 1 HR: 76 {beats}/min
Stage 1 Speed: 0 mph
Stage 2 DBP: 94 mmHg
Stage 2 Grade: 0 %
Stage 2 HR: 82 {beats}/min
Stage 2 SBP: 171 mmHg
Stage 2 Speed: 1 mph
Stage 3 Grade: 0 %
Stage 3 HR: 83 {beats}/min
Stage 3 Speed: 1 mph
Stage 4 Grade: 10 %
Stage 4 HR: 102 {beats}/min
Stage 4 Speed: 1.7 mph
Stage 5 DBP: 83 mmHg
Stage 5 Grade: 0 %
Stage 5 HR: 96 {beats}/min
Stage 5 SBP: 183 mmHg
Stage 5 Speed: 0 mph
Stage 6 Grade: 0 %
Stage 6 HR: 73 {beats}/min
Stage 6 Speed: 0 mph

## 2016-03-06 ENCOUNTER — Ambulatory Visit (INDEPENDENT_AMBULATORY_CARE_PROVIDER_SITE_OTHER): Payer: Medicare Other | Admitting: Cardiology

## 2016-03-06 ENCOUNTER — Encounter: Payer: Self-pay | Admitting: Cardiology

## 2016-03-06 VITALS — BP 136/80 | HR 64 | Ht 63.0 in | Wt 205.1 lb

## 2016-03-06 DIAGNOSIS — I10 Essential (primary) hypertension: Secondary | ICD-10-CM | POA: Diagnosis not present

## 2016-03-06 DIAGNOSIS — R7303 Prediabetes: Secondary | ICD-10-CM | POA: Diagnosis not present

## 2016-03-06 DIAGNOSIS — E669 Obesity, unspecified: Secondary | ICD-10-CM | POA: Diagnosis not present

## 2016-03-06 DIAGNOSIS — R9431 Abnormal electrocardiogram [ECG] [EKG]: Secondary | ICD-10-CM | POA: Diagnosis not present

## 2016-03-06 NOTE — Patient Instructions (Signed)
Medication Instructions:  The current medical regimen is effective;  continue present plan and medications.  Follow-Up: Follow up as needed with Dr Skains.  Thank you for choosing [facility]!!     

## 2016-03-06 NOTE — Progress Notes (Signed)
Cardiology Office Note    Date:  03/06/2016   ID:  ALYHA RIDENBAUGH, DOB September 09, 1941, MRN [id number]  PCP:  Thressa Sheller, MD  Cardiologist:   Candee Furbish, MD     History of Present Illness:  Dorothy Anderson is a 75 y.o. female here for cardiac evaluation following nondiagnostic exercise treadmill test which was prompted by T-wave inversion in lead 3, inferior lead on EKG.  She states that she is not having any anginal symptoms, no chest pain, no shortness of breath. No syncope, no bleeding. I take care of her husband. She tried to walk the treadmill but was having discomfort in her knees and had to stop. I personally reviewed exercise stress test.   Prior knee replacement.    Past Medical History  Diagnosis Date  . Hypertension   . GERD (gastroesophageal reflux disease)   . History of kidney stones   . Hyperglycemia   . Nocturia   . Frequency of urination   . Glaucoma BILATERAL EYES  . Wears glasses   . Complication of anesthesia     slow to wake up  . Diabetes mellitus without complication ([place])     PRE DIABETIC ORAL MEDS   . Hydronephrosis, left     DR. STEVEN DAHLSTADT  . Calculus of left kidney   . Arthritis KNEES    OA    Past Surgical History  Procedure Laterality Date  . Total knee arthroplasty  09-29-2005    RIGHT  . Carpal tunnel release  06-17-2005    RIGHT  . Cysto/ left retrograde pyelogram/ left ureteral stent placement  04-15-2005    URETERAL STONE  . Cataract extraction w/ intraocular lens  implant, bilateral    . Extracorporeal shock wave lithotripsy  11-09-11    RIGHT  . Tonsillectomy and adenoidectomy  AGE 7  . Cystoscopy/retrograde/ureteroscopy  01/27/2012    Procedure: CYSTOSCOPY/RETROGRADE/URETEROSCOPY;  Surgeon: Franchot Gallo, MD;  Location[facility];  Service: Urology;  Laterality: Right;  . Total knee arthroplasty Left 01/17/2013    Procedure: TOTAL KNEE ARTHROPLASTY;  Surgeon: Meredith Pel, MD;  Location: [facility];  Service: Orthopedics;  Laterality: Left;  Left Total Knee Arthroplasty    Current Medications: Outpatient Prescriptions Prior to Visit  Medication Sig Dispense Refill  . aspirin EC 81 MG tablet Take 81 mg by mouth at bedtime.     Marland Kitchen CALCIUM-VITAMIN D PO Take 1 tablet by mouth daily.    . cholecalciferol (VITAMIN D) 1000 UNITS tablet Take 1,000 Units by mouth daily.     Marland Kitchen docusate sodium (COLACE) 100 MG capsule Take 100 mg by mouth 2 (two) times daily.     . lansoprazole (PREVACID) 15 MG capsule Take 15 mg by mouth 2 (two) times daily.     Marland Kitchen latanoprost (XALATAN) 0.005 % ophthalmic solution Place 1 drop into both eyes at bedtime.     . metFORMIN (GLUCOPHAGE-XR) 500 MG 24 hr tablet Take 500 mg by mouth daily with breakfast.    . metoprolol tartrate (LOPRESSOR) 25 MG tablet Take 25 mg by mouth 2 (two) times daily.     . Multiple Vitamins-Minerals (MULTIVITAMINS THER. W/MINERALS) TABS Take 1 tablet by mouth daily.     . naproxen (NAPROSYN) 500 MG tablet Take 500 mg by mouth 2 (two) times daily with a meal.     . HYDROcodone-acetaminophen (NORCO) 10-325 MG per tablet Take 1 tablet by mouth every 4 (four) hours as needed. 60 tablet 0  .  HYDROcodone-acetaminophen (VICODIN) 5-500 MG per tablet Take 1 tablet by mouth every 6 (six) hours as needed for pain. PAIN     . methocarbamol (ROBAXIN) 500 MG tablet Take 1 tablet (500 mg total) by mouth 3 (three) times daily. 30 tablet 0  . warfarin (COUMADIN) 5 MG tablet Take 1 tablet (5 mg total) by mouth daily. 30 tablet 1   No facility-administered medications prior to visit.    Stop warfarin over 10 years ago.  Allergies:   Darvon; Macrobid; Nitrofurantoin; and Propoxyphene   Social History   Social History  . Marital Status: Married    Spouse Name: N/A  . Number of Children: N/A  . Years of Education: N/A   Social History Main Topics  . Smoking status: Never Smoker   . Smokeless tobacco: Never Used  . Alcohol Use: No  . Drug Use: No  .  Sexual Activity: Not Asked   Other Topics Concern  . None   Social History Narrative     Family History:  [name]; [name]   ROS:   Please see the history of present illness.    ROS All other systems reviewed and are negative.   PHYSICAL EXAM:   VS:  BP 136/80 mmHg  Pulse 64  Ht 5\' 3"  (1.6 m)  Wt 205 lb 1.9 oz (93.042 kg)  BMI 36.34 kg/m2   GEN: Well nourished, well developed, in no acute distress HEENT: normal Neck: no JVD, carotid bruits, or masses Cardiac: RRR; no murmurs, rubs, or gallops,no edema  Respiratory:  clear to auscultation bilaterally, normal work of breathing GI: soft, nontender, nondistended, + BS MS: no deformity or atrophy Skin: warm and dry, no rash Neuro:  Alert and Oriented x 3, Strength and sensation are intact Psych: euthymic mood, full affect  Wt Readings from Last 3 Encounters:  03/06/16 205 lb 1.9 oz (93.042 kg)  01/16/13 201 lb 3.2 oz (91.264 kg)  01/21/12 200 lb (90.719 kg)      Studies/Labs Reviewed:   EKG:  EKG is notordered today.  The ekg from 01/31/16 demonstrates T-wave inversion inferior lead. She is here for evaluation of this.  Recent Labs: No results found for requested labs within last 365 days.   Lipid Panel No results found for: CHOL, TRIG, HDL, CHOLHDL, VLDL, LDLCALC, LDLDIRECT  Additional studies/ records that were reviewed today include:  Prior office note, lab work and EKG reviewed. Stress test personally reviewed, no T-wave inversion noted during stress test. No ST segment deviation noted as well. Poor exercise tolerance. Response exercise was inadequate for full diagnosis.    ASSESSMENT:    1. Abnormal ECG   2. Obesity   3. Prediabetes   4. Essential hypertension      PLAN:  In order of problems listed above:  Abnormal EKG -Previously isolated T-wave inversion in single inferior lead. -Exercise treadmill test performed however this was  nondiagnostic due to failure to achieve target heart rate as well as poor exercise tolerance secondary to bilateral knee pain. I personally reviewed the treadmill test and she was able to increase her blood pressure appropriately and there were no EKG changes present in this submaximal stress test. She is not having any chest discomfort, no shortness of breath, no other anginal symptoms, no syncope. Based upon her lack of symptoms, we discussed at length and decided not to pursue any further cardiac testing at this time. If symptoms do arise,  she will let me know. I take care of her husband.  Obesity -Encourage weight loss  Prediabetes -Continuing with metformin per Dr. Alyson Ingles  Essential hypertension -Currently well controlled on current medications. She is not on Coumadin.    Medication Adjustments/Labs and Tests Ordered: Current medicines are reviewed at length with the patient today.  Concerns regarding medicines are outlined above.  Medication changes, Labs and Tests ordered today are listed in the Patient Instructions below. Patient Instructions  Medication Instructions:  The current medical regimen is effective;  continue present plan and medications.  Follow-Up: Follow up as needed with Dr Marlou Porch.  Thank you for choosing [facility]!!         Signed, Candee Furbish, MD  03/06/2016 9:54 AM    [facility] [place], [place], [place]  [address] Phone: [phone]; Fax: [phone]

## 2016-03-11 DIAGNOSIS — M8580 Other specified disorders of bone density and structure, unspecified site: Secondary | ICD-10-CM | POA: Diagnosis not present

## 2016-03-11 DIAGNOSIS — N182 Chronic kidney disease, stage 2 (mild): Secondary | ICD-10-CM | POA: Diagnosis not present

## 2016-03-11 DIAGNOSIS — M150 Primary generalized (osteo)arthritis: Secondary | ICD-10-CM | POA: Diagnosis not present

## 2016-03-17 ENCOUNTER — Encounter: Payer: Self-pay | Admitting: Cardiology

## 2016-04-13 DIAGNOSIS — M150 Primary generalized (osteo)arthritis: Secondary | ICD-10-CM | POA: Diagnosis not present

## 2016-04-13 DIAGNOSIS — M8580 Other specified disorders of bone density and structure, unspecified site: Secondary | ICD-10-CM | POA: Diagnosis not present

## 2016-04-13 DIAGNOSIS — N182 Chronic kidney disease, stage 2 (mild): Secondary | ICD-10-CM | POA: Diagnosis not present

## 2016-04-13 DIAGNOSIS — M25569 Pain in unspecified knee: Secondary | ICD-10-CM | POA: Diagnosis not present

## 2016-07-07 DIAGNOSIS — Z961 Presence of intraocular lens: Secondary | ICD-10-CM | POA: Diagnosis not present

## 2016-07-07 DIAGNOSIS — H04123 Dry eye syndrome of bilateral lacrimal glands: Secondary | ICD-10-CM | POA: Diagnosis not present

## 2016-07-07 DIAGNOSIS — H401131 Primary open-angle glaucoma, bilateral, mild stage: Secondary | ICD-10-CM | POA: Diagnosis not present

## 2016-07-10 ENCOUNTER — Other Ambulatory Visit: Payer: Self-pay

## 2016-07-14 DIAGNOSIS — M25569 Pain in unspecified knee: Secondary | ICD-10-CM | POA: Diagnosis not present

## 2016-07-14 DIAGNOSIS — N182 Chronic kidney disease, stage 2 (mild): Secondary | ICD-10-CM | POA: Diagnosis not present

## 2016-07-14 DIAGNOSIS — M8580 Other specified disorders of bone density and structure, unspecified site: Secondary | ICD-10-CM | POA: Diagnosis not present

## 2016-07-14 DIAGNOSIS — M150 Primary generalized (osteo)arthritis: Secondary | ICD-10-CM | POA: Diagnosis not present

## 2016-08-05 DIAGNOSIS — I129 Hypertensive chronic kidney disease with stage 1 through stage 4 chronic kidney disease, or unspecified chronic kidney disease: Secondary | ICD-10-CM | POA: Diagnosis not present

## 2016-08-05 DIAGNOSIS — E1122 Type 2 diabetes mellitus with diabetic chronic kidney disease: Secondary | ICD-10-CM | POA: Diagnosis not present

## 2016-08-05 DIAGNOSIS — E559 Vitamin D deficiency, unspecified: Secondary | ICD-10-CM | POA: Diagnosis not present

## 2016-08-05 DIAGNOSIS — M8580 Other specified disorders of bone density and structure, unspecified site: Secondary | ICD-10-CM | POA: Diagnosis not present

## 2016-08-12 DIAGNOSIS — Z23 Encounter for immunization: Secondary | ICD-10-CM | POA: Diagnosis not present

## 2016-08-12 DIAGNOSIS — I129 Hypertensive chronic kidney disease with stage 1 through stage 4 chronic kidney disease, or unspecified chronic kidney disease: Secondary | ICD-10-CM | POA: Diagnosis not present

## 2016-08-12 DIAGNOSIS — E875 Hyperkalemia: Secondary | ICD-10-CM | POA: Diagnosis not present

## 2016-08-12 DIAGNOSIS — M8580 Other specified disorders of bone density and structure, unspecified site: Secondary | ICD-10-CM | POA: Diagnosis not present

## 2016-08-12 DIAGNOSIS — E1122 Type 2 diabetes mellitus with diabetic chronic kidney disease: Secondary | ICD-10-CM | POA: Diagnosis not present

## 2016-08-12 DIAGNOSIS — N183 Chronic kidney disease, stage 3 (moderate): Secondary | ICD-10-CM | POA: Diagnosis not present

## 2016-09-11 DIAGNOSIS — N3281 Overactive bladder: Secondary | ICD-10-CM | POA: Diagnosis not present

## 2016-09-11 DIAGNOSIS — N200 Calculus of kidney: Secondary | ICD-10-CM | POA: Diagnosis not present

## 2016-10-08 IMAGING — US US ABDOMEN LIMITED
1 series · 14 of 25 positions shown · non-contrast
Comparison: Abdominal CT scan October 09, 2011

CLINICAL DATA: Follow-up of cyst in the right lobe of the liver.

EXAM:
US ABDOMEN LIMITED - RIGHT UPPER QUADRANT

[Series 1: us abdomen limited · 0.21mm/px · 14 of 55 slices shown]
[im 1/55]
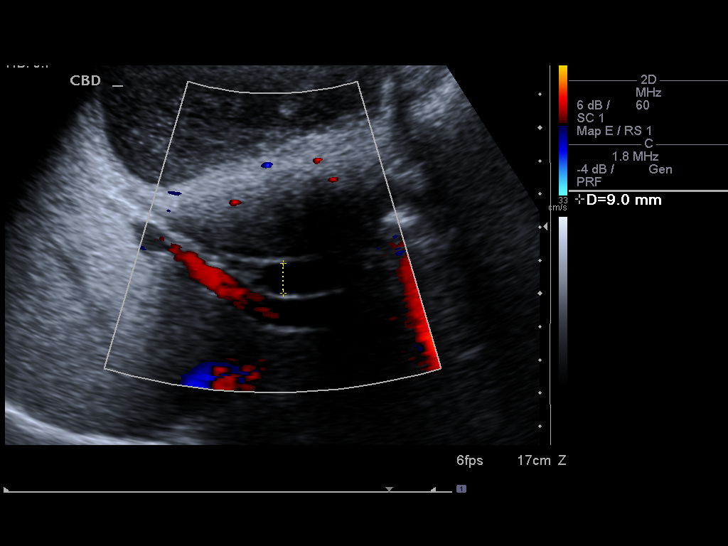
[im 5/55]
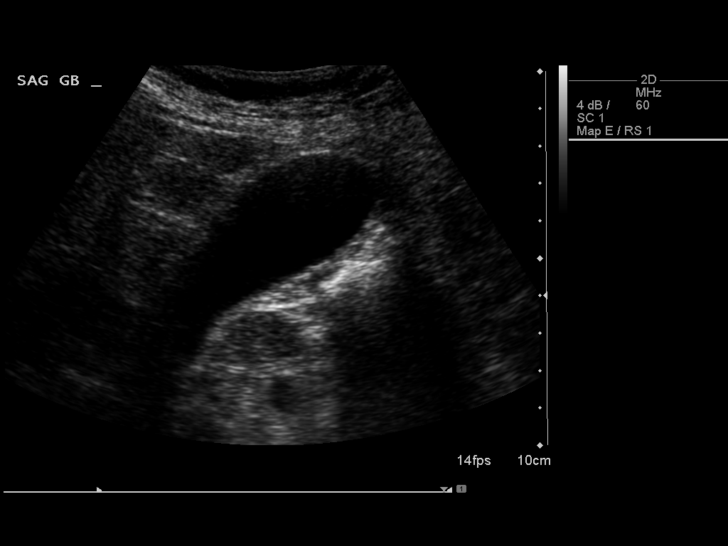
[im 10/55]
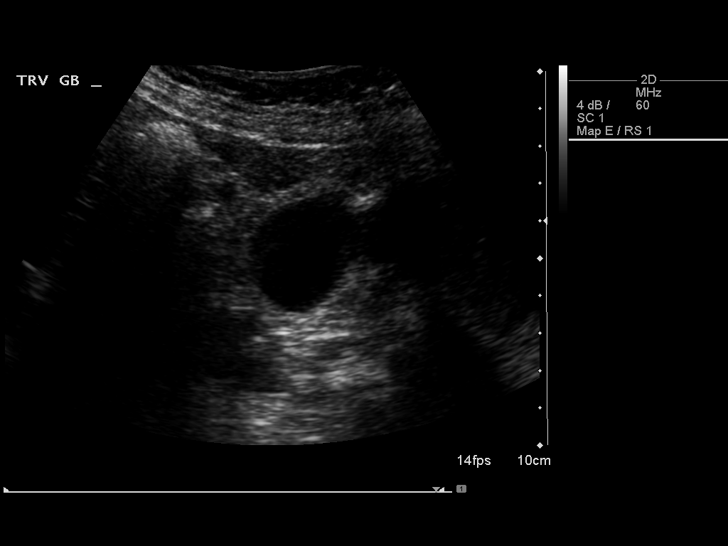
[im 14/55]
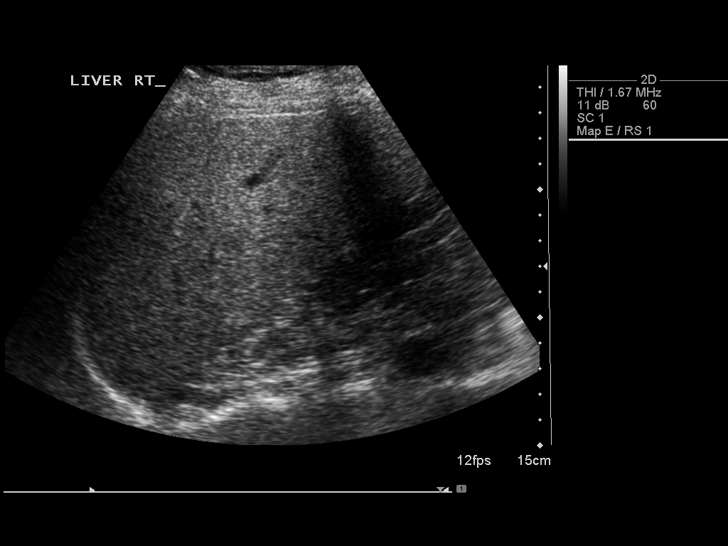
[im 19/55]
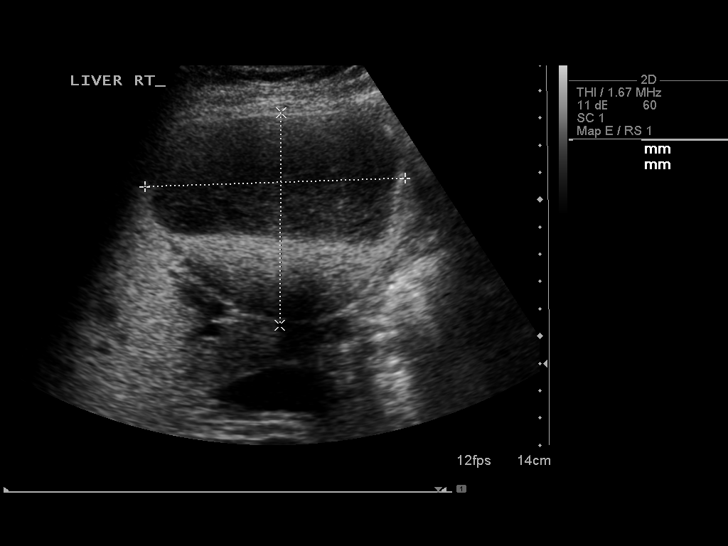
[im 21/55]
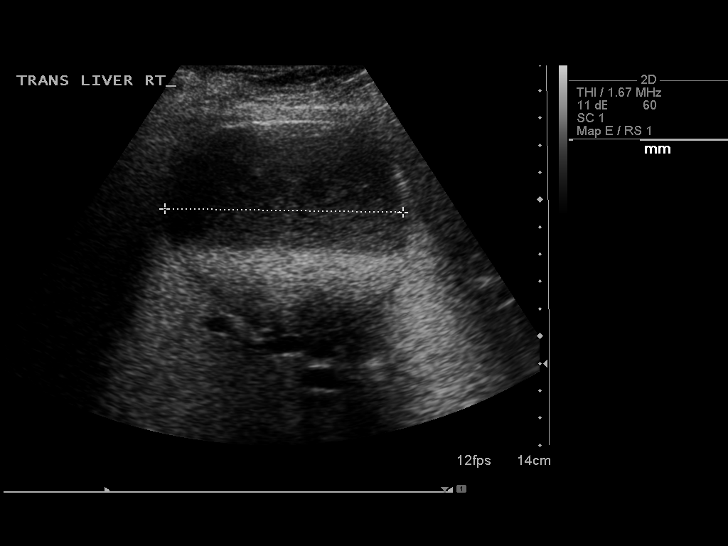
[im 25/55]
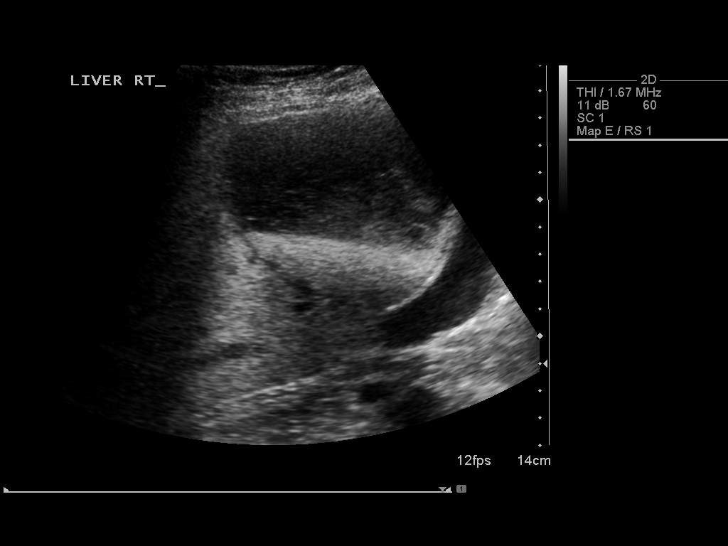
[im 30/55]
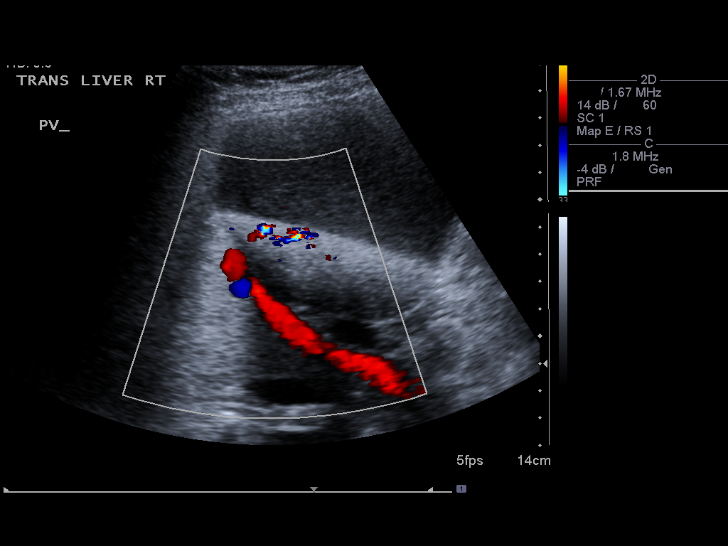
[im 34/55]
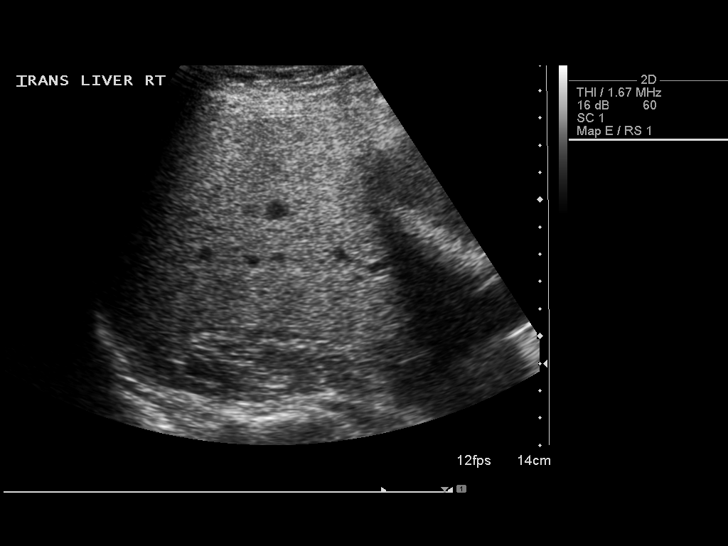
[im 37/55]
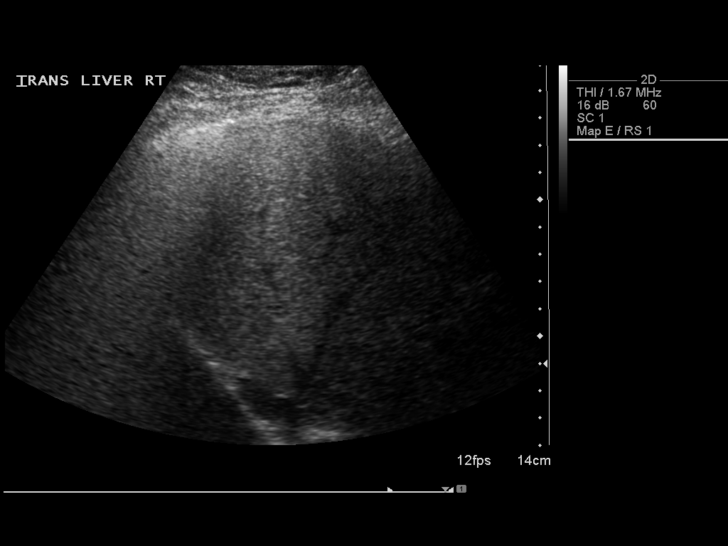
[im 41/55]
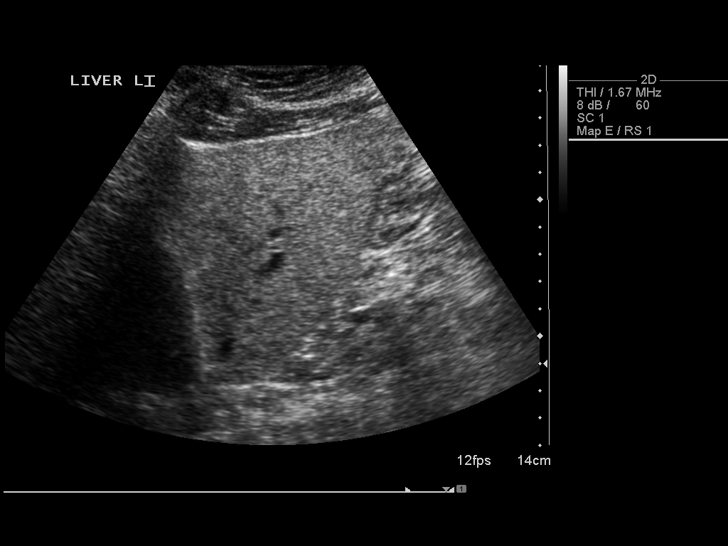
[im 46/55]
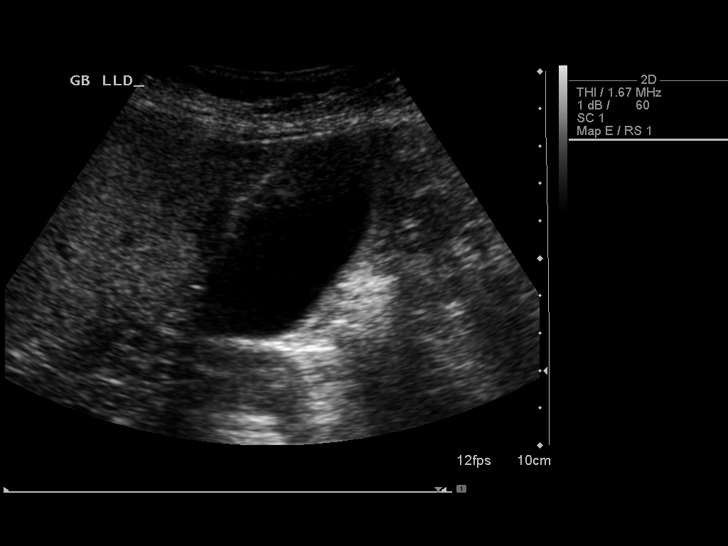
[im 50/55]
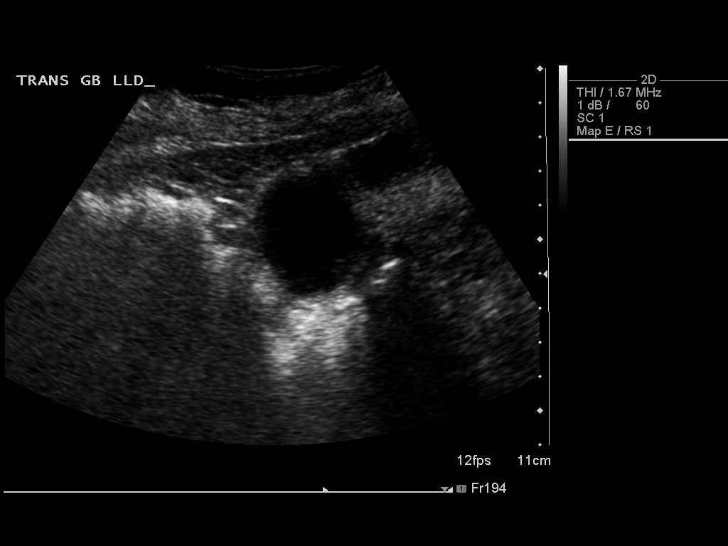
[im 55/55]
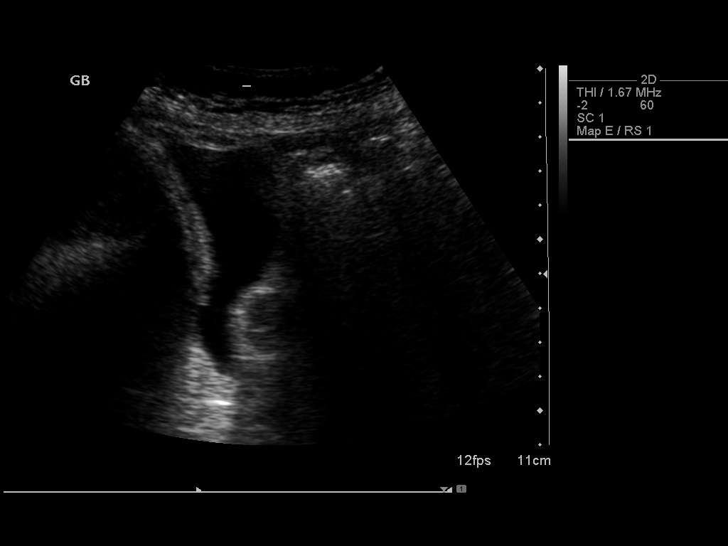

[14 of 25 positions shown; findings below may reference images not displayed]

FINDINGS: Gallbladder:

No gallstones or wall thickening visualized. No sonographic Murphy
sign noted.

Common bile duct:

Diameter: 8.4-9 mm

Liver:

In the right hepatic lobe there is a dominant cystic mass with
internal echoes which measures 9.5 x 7.8 x 8.7 cm. No other hepatic
lesions are demonstrated. There is no intra hepatic ductal dilation.
IMPRESSION: 1. Stable size of the cystic structure in the right hepatic lobe.
There are internal echoes which appear mobile suggesting debris. A
similar appearance was noted on an ultrasound examination March 2008. The appearance of the cyst was fairly homogeneous on the
September 2011 CT scan.
2. There is mild dilation of the common bile duct which is stable.
3. There is no acute gallbladder pathology.

## 2016-10-14 DIAGNOSIS — N182 Chronic kidney disease, stage 2 (mild): Secondary | ICD-10-CM | POA: Diagnosis not present

## 2016-10-14 DIAGNOSIS — M25569 Pain in unspecified knee: Secondary | ICD-10-CM | POA: Diagnosis not present

## 2016-10-14 DIAGNOSIS — M25519 Pain in unspecified shoulder: Secondary | ICD-10-CM | POA: Diagnosis not present

## 2016-10-14 DIAGNOSIS — M150 Primary generalized (osteo)arthritis: Secondary | ICD-10-CM | POA: Diagnosis not present

## 2016-11-18 DIAGNOSIS — H35373 Puckering of macula, bilateral: Secondary | ICD-10-CM | POA: Diagnosis not present

## 2016-11-18 DIAGNOSIS — H04123 Dry eye syndrome of bilateral lacrimal glands: Secondary | ICD-10-CM | POA: Diagnosis not present

## 2016-11-18 DIAGNOSIS — H401131 Primary open-angle glaucoma, bilateral, mild stage: Secondary | ICD-10-CM | POA: Diagnosis not present

## 2016-11-18 DIAGNOSIS — Z961 Presence of intraocular lens: Secondary | ICD-10-CM | POA: Diagnosis not present

## 2017-01-25 ENCOUNTER — Ambulatory Visit (INDEPENDENT_AMBULATORY_CARE_PROVIDER_SITE_OTHER): Payer: Self-pay | Admitting: Orthopedic Surgery

## 2017-02-03 ENCOUNTER — Ambulatory Visit (INDEPENDENT_AMBULATORY_CARE_PROVIDER_SITE_OTHER): Payer: Medicare Other | Admitting: Orthopedic Surgery

## 2017-02-03 ENCOUNTER — Encounter (INDEPENDENT_AMBULATORY_CARE_PROVIDER_SITE_OTHER): Payer: Self-pay | Admitting: Orthopedic Surgery

## 2017-02-03 DIAGNOSIS — Z96653 Presence of artificial knee joint, bilateral: Secondary | ICD-10-CM | POA: Diagnosis not present

## 2017-02-03 NOTE — Progress Notes (Signed)
Office Visit Note   Patient: Dorothy Anderson           Date of Birth: 1941-07-19           MRN: [id number] Visit Date: 02/03/2017 Requested by: Thressa Sheller, MD [address] [place], [place] [address] PCP: Thressa Sheller, MD  Subjective: Chief Complaint  Patient presents with  . Right Knee - Follow-up  . Left Knee - Follow-up    HPI: Dorothy Anderson is a 76 year old patient is now about 3 years out from knee replacement.  She's had both knees replaced.  She's been doing well with them.  She is also under rheumatologic care but her rheumatologist just left.  Her primary care physician is also under medical leave.  She denies having any problems with her knees.              ROS: All systems reviewed are negative as they relate to the chief complaint within the history of present illness.  Patient denies  fevers or chills.   Assessment & Plan: Visit Diagnoses:  1. History of bilateral knee replacement     Plan: Impression is well functioning right total knee replacement and well-functioning left total knee replacement.  No evidence of loosening infection no effusion in either knee at this time.  Plan is for follow-up as needed.  She has good range of motion and good functional ability with her knees.  Plan to refer her to a rheumatologist that she's been under the care of a rheumatologist for the past 7-8 years for arthritis.  I'll see her back as needed.  Continue with antibiotics for procedural prophylaxis.  Follow-Up Instructions: No Follow-up on file.   Orders:  No orders of the defined types were placed in this encounter.  No orders of the defined types were placed in this encounter.     Procedures: No procedures performed   Clinical Data: No additional findings.  Objective: Vital Signs: There were no vitals taken for this visit.  Physical Exam:   Constitutional: Patient appears well-developed HEENT:  Head: Normocephalic Eyes:EOM are normal Neck:  Normal range of motion Cardiovascular: Normal rate Pulmonary/chest: Effort normal Neurologic: Patient is alert Skin: Skin is warm Psychiatric: Patient has normal mood and affect    Ortho Exam: Orthopedic exam demonstrates full active and passive range of motion of both knees.  She has about 110 flexion on the right and about 105-100 on the left.  No effusion collaterals are stable extensor mechanism is intact pedal pulses palpable no groin pain with internal/external rotation leg no other masses lymph and anterior skin changes noted in the in knee knee region.  Specialty Comments:  No specialty comments available.  Imaging: No results found.   PMFS History: Patient Active Problem List   Diagnosis Date Noted  . Osteoarthritis of left knee 01/17/2013    Class: Diagnosis of   Past Medical History:  Diagnosis Date  . Arthritis KNEES   OA  . Calculus of left kidney   . Complication of anesthesia    slow to wake up  . Diabetes mellitus without complication ([place])    PRE DIABETIC ORAL MEDS   . Frequency of urination   . GERD (gastroesophageal reflux disease)   . Glaucoma BILATERAL EYES  . History of kidney stones   . Hydronephrosis, left    DR. STEVEN DAHLSTADT  . Hyperglycemia   . Hypertension   . Nocturia   . Wears glasses     Family History  Problem Relation Age of Onset  . CVA Mother   . Heart attack Father     Past Surgical History:  Procedure Laterality Date  . CARPAL TUNNEL RELEASE  06-17-2005   RIGHT  . CATARACT EXTRACTION W/ INTRAOCULAR LENS  IMPLANT, BILATERAL    . CYSTO/ LEFT RETROGRADE PYELOGRAM/ LEFT URETERAL STENT PLACEMENT  04-15-2005   URETERAL STONE  . CYSTOSCOPY/RETROGRADE/URETEROSCOPY  01/27/2012   Procedure: CYSTOSCOPY/RETROGRADE/URETEROSCOPY;  Surgeon: Franchot Gallo, MD;  [facility];  Service: Urology;  Laterality: Right;  . EXTRACORPOREAL SHOCK WAVE LITHOTRIPSY  11-09-11   RIGHT  . TONSILLECTOMY AND ADENOIDECTOMY   AGE 7  . TOTAL KNEE ARTHROPLASTY  09-29-2005   RIGHT  . TOTAL KNEE ARTHROPLASTY Left 01/17/2013   Procedure: TOTAL KNEE ARTHROPLASTY;  Surgeon: Meredith Pel, MD;  Location: [facility];  Service: Orthopedics;  Laterality: Left;  Left Total Knee Arthroplasty   Social History   Occupational History  . Not on file.   Social History Main Topics  . Smoking status: Never Smoker  . Smokeless tobacco: Never Used  . Alcohol use No  . Drug use: No  . Sexual activity: Not on file

## 2017-02-03 NOTE — Addendum Note (Signed)
Addended byLaurann Montana on: 02/03/2017 01:51 PM   Modules accepted: Orders

## 2017-02-22 DIAGNOSIS — I129 Hypertensive chronic kidney disease with stage 1 through stage 4 chronic kidney disease, or unspecified chronic kidney disease: Secondary | ICD-10-CM | POA: Diagnosis not present

## 2017-02-22 DIAGNOSIS — E1122 Type 2 diabetes mellitus with diabetic chronic kidney disease: Secondary | ICD-10-CM | POA: Diagnosis not present

## 2017-02-22 DIAGNOSIS — E559 Vitamin D deficiency, unspecified: Secondary | ICD-10-CM | POA: Diagnosis not present

## 2017-02-22 DIAGNOSIS — I10 Essential (primary) hypertension: Secondary | ICD-10-CM | POA: Diagnosis not present

## 2017-02-22 DIAGNOSIS — M8580 Other specified disorders of bone density and structure, unspecified site: Secondary | ICD-10-CM | POA: Diagnosis not present

## 2017-02-22 DIAGNOSIS — N390 Urinary tract infection, site not specified: Secondary | ICD-10-CM | POA: Diagnosis not present

## 2017-03-01 DIAGNOSIS — E1122 Type 2 diabetes mellitus with diabetic chronic kidney disease: Secondary | ICD-10-CM | POA: Diagnosis not present

## 2017-03-01 DIAGNOSIS — N183 Chronic kidney disease, stage 3 (moderate): Secondary | ICD-10-CM | POA: Diagnosis not present

## 2017-03-01 DIAGNOSIS — I129 Hypertensive chronic kidney disease with stage 1 through stage 4 chronic kidney disease, or unspecified chronic kidney disease: Secondary | ICD-10-CM | POA: Diagnosis not present

## 2017-03-15 DIAGNOSIS — R197 Diarrhea, unspecified: Secondary | ICD-10-CM | POA: Diagnosis not present

## 2017-03-23 DIAGNOSIS — M150 Primary generalized (osteo)arthritis: Secondary | ICD-10-CM | POA: Diagnosis not present

## 2017-03-23 DIAGNOSIS — N182 Chronic kidney disease, stage 2 (mild): Secondary | ICD-10-CM | POA: Diagnosis not present

## 2017-03-23 DIAGNOSIS — M5136 Other intervertebral disc degeneration, lumbar region: Secondary | ICD-10-CM | POA: Diagnosis not present

## 2017-03-23 DIAGNOSIS — E669 Obesity, unspecified: Secondary | ICD-10-CM | POA: Diagnosis not present

## 2017-03-23 DIAGNOSIS — Z6834 Body mass index (BMI) 34.0-34.9, adult: Secondary | ICD-10-CM | POA: Diagnosis not present

## 2017-03-23 DIAGNOSIS — M8580 Other specified disorders of bone density and structure, unspecified site: Secondary | ICD-10-CM | POA: Diagnosis not present

## 2017-05-18 DIAGNOSIS — Z961 Presence of intraocular lens: Secondary | ICD-10-CM | POA: Diagnosis not present

## 2017-05-18 DIAGNOSIS — H04123 Dry eye syndrome of bilateral lacrimal glands: Secondary | ICD-10-CM | POA: Diagnosis not present

## 2017-05-18 DIAGNOSIS — H401131 Primary open-angle glaucoma, bilateral, mild stage: Secondary | ICD-10-CM | POA: Diagnosis not present

## 2017-06-23 DIAGNOSIS — M5136 Other intervertebral disc degeneration, lumbar region: Secondary | ICD-10-CM | POA: Diagnosis not present

## 2017-06-23 DIAGNOSIS — M150 Primary generalized (osteo)arthritis: Secondary | ICD-10-CM | POA: Diagnosis not present

## 2017-06-23 DIAGNOSIS — E669 Obesity, unspecified: Secondary | ICD-10-CM | POA: Diagnosis not present

## 2017-06-23 DIAGNOSIS — N182 Chronic kidney disease, stage 2 (mild): Secondary | ICD-10-CM | POA: Diagnosis not present

## 2017-06-23 DIAGNOSIS — Z6835 Body mass index (BMI) 35.0-35.9, adult: Secondary | ICD-10-CM | POA: Diagnosis not present

## 2017-06-23 DIAGNOSIS — M8580 Other specified disorders of bone density and structure, unspecified site: Secondary | ICD-10-CM | POA: Diagnosis not present

## 2017-08-03 DIAGNOSIS — N200 Calculus of kidney: Secondary | ICD-10-CM | POA: Diagnosis not present

## 2017-08-03 DIAGNOSIS — M549 Dorsalgia, unspecified: Secondary | ICD-10-CM | POA: Diagnosis not present

## 2017-08-03 DIAGNOSIS — R8271 Bacteriuria: Secondary | ICD-10-CM | POA: Diagnosis not present

## 2017-08-09 ENCOUNTER — Emergency Department (HOSPITAL_COMMUNITY)
Admission: EM | Admit: 2017-08-09 | Discharge: 2017-08-09 | Discharge status: Against Medical Advice | Payer: Medicare Other

## 2017-08-09 DIAGNOSIS — N3000 Acute cystitis without hematuria: Secondary | ICD-10-CM | POA: Diagnosis not present

## 2017-08-09 NOTE — ED Notes (Signed)
Informed by registration staff that pt has left.

## 2017-08-25 DIAGNOSIS — I10 Essential (primary) hypertension: Secondary | ICD-10-CM | POA: Diagnosis not present

## 2017-08-25 DIAGNOSIS — I129 Hypertensive chronic kidney disease with stage 1 through stage 4 chronic kidney disease, or unspecified chronic kidney disease: Secondary | ICD-10-CM | POA: Diagnosis not present

## 2017-08-25 DIAGNOSIS — E1122 Type 2 diabetes mellitus with diabetic chronic kidney disease: Secondary | ICD-10-CM | POA: Diagnosis not present

## 2017-08-30 DIAGNOSIS — N3000 Acute cystitis without hematuria: Secondary | ICD-10-CM | POA: Diagnosis not present

## 2017-08-30 DIAGNOSIS — R8271 Bacteriuria: Secondary | ICD-10-CM | POA: Diagnosis not present

## 2017-08-30 DIAGNOSIS — M549 Dorsalgia, unspecified: Secondary | ICD-10-CM | POA: Diagnosis not present

## 2017-08-31 DIAGNOSIS — N390 Urinary tract infection, site not specified: Secondary | ICD-10-CM | POA: Diagnosis not present

## 2017-08-31 DIAGNOSIS — N3289 Other specified disorders of bladder: Secondary | ICD-10-CM | POA: Diagnosis not present

## 2017-08-31 DIAGNOSIS — N200 Calculus of kidney: Secondary | ICD-10-CM | POA: Diagnosis not present

## 2017-09-13 DIAGNOSIS — I129 Hypertensive chronic kidney disease with stage 1 through stage 4 chronic kidney disease, or unspecified chronic kidney disease: Secondary | ICD-10-CM | POA: Diagnosis not present

## 2017-09-13 DIAGNOSIS — E1122 Type 2 diabetes mellitus with diabetic chronic kidney disease: Secondary | ICD-10-CM | POA: Diagnosis not present

## 2017-09-13 DIAGNOSIS — Z23 Encounter for immunization: Secondary | ICD-10-CM | POA: Diagnosis not present

## 2017-09-22 DIAGNOSIS — Z86010 Personal history of colonic polyps: Secondary | ICD-10-CM | POA: Diagnosis not present

## 2017-09-22 DIAGNOSIS — R9389 Abnormal findings on diagnostic imaging of other specified body structures: Secondary | ICD-10-CM | POA: Diagnosis not present

## 2017-09-23 DIAGNOSIS — M150 Primary generalized (osteo)arthritis: Secondary | ICD-10-CM | POA: Diagnosis not present

## 2017-09-23 DIAGNOSIS — M5136 Other intervertebral disc degeneration, lumbar region: Secondary | ICD-10-CM | POA: Diagnosis not present

## 2017-09-23 DIAGNOSIS — M8580 Other specified disorders of bone density and structure, unspecified site: Secondary | ICD-10-CM | POA: Diagnosis not present

## 2017-09-23 DIAGNOSIS — Z6833 Body mass index (BMI) 33.0-33.9, adult: Secondary | ICD-10-CM | POA: Diagnosis not present

## 2017-09-23 DIAGNOSIS — N182 Chronic kidney disease, stage 2 (mild): Secondary | ICD-10-CM | POA: Diagnosis not present

## 2017-09-23 DIAGNOSIS — E669 Obesity, unspecified: Secondary | ICD-10-CM | POA: Diagnosis not present

## 2017-10-26 DIAGNOSIS — K5730 Diverticulosis of large intestine without perforation or abscess without bleeding: Secondary | ICD-10-CM | POA: Diagnosis not present

## 2017-10-26 DIAGNOSIS — R933 Abnormal findings on diagnostic imaging of other parts of digestive tract: Secondary | ICD-10-CM | POA: Diagnosis not present

## 2017-10-26 DIAGNOSIS — R1030 Lower abdominal pain, unspecified: Secondary | ICD-10-CM | POA: Diagnosis not present

## 2017-10-26 DIAGNOSIS — Z86010 Personal history of colonic polyps: Secondary | ICD-10-CM | POA: Diagnosis not present

## 2017-10-26 DIAGNOSIS — K648 Other hemorrhoids: Secondary | ICD-10-CM | POA: Diagnosis not present

## 2017-10-26 DIAGNOSIS — D126 Benign neoplasm of colon, unspecified: Secondary | ICD-10-CM | POA: Diagnosis not present

## 2017-10-27 DIAGNOSIS — N3281 Overactive bladder: Secondary | ICD-10-CM | POA: Diagnosis not present

## 2017-10-27 DIAGNOSIS — N3000 Acute cystitis without hematuria: Secondary | ICD-10-CM | POA: Diagnosis not present

## 2017-10-28 DIAGNOSIS — D126 Benign neoplasm of colon, unspecified: Secondary | ICD-10-CM | POA: Diagnosis not present

## 2017-11-03 DIAGNOSIS — I129 Hypertensive chronic kidney disease with stage 1 through stage 4 chronic kidney disease, or unspecified chronic kidney disease: Secondary | ICD-10-CM | POA: Diagnosis not present

## 2017-11-03 DIAGNOSIS — E875 Hyperkalemia: Secondary | ICD-10-CM | POA: Diagnosis not present

## 2017-11-08 DIAGNOSIS — E875 Hyperkalemia: Secondary | ICD-10-CM | POA: Diagnosis not present

## 2017-11-15 ENCOUNTER — Other Ambulatory Visit: Payer: Self-pay

## 2017-11-15 ENCOUNTER — Emergency Department (HOSPITAL_COMMUNITY)
Admission: EM | Admit: 2017-11-15 | Discharge: 2017-11-16 | Disposition: A | Discharge status: Home / Self Care | Payer: Medicare Other | Attending: Emergency Medicine | Admitting: Emergency Medicine

## 2017-11-15 ENCOUNTER — Encounter (HOSPITAL_COMMUNITY): Payer: Self-pay | Admitting: *Deleted

## 2017-11-15 DIAGNOSIS — Z7982 Long term (current) use of aspirin: Secondary | ICD-10-CM | POA: Insufficient documentation

## 2017-11-15 DIAGNOSIS — R634 Abnormal weight loss: Secondary | ICD-10-CM | POA: Diagnosis not present

## 2017-11-15 DIAGNOSIS — I10 Essential (primary) hypertension: Secondary | ICD-10-CM | POA: Insufficient documentation

## 2017-11-15 DIAGNOSIS — E875 Hyperkalemia: Secondary | ICD-10-CM | POA: Diagnosis not present

## 2017-11-15 DIAGNOSIS — Z79899 Other long term (current) drug therapy: Secondary | ICD-10-CM | POA: Diagnosis not present

## 2017-11-15 DIAGNOSIS — E119 Type 2 diabetes mellitus without complications: Secondary | ICD-10-CM | POA: Diagnosis not present

## 2017-11-15 DIAGNOSIS — Z7984 Long term (current) use of oral hypoglycemic drugs: Secondary | ICD-10-CM | POA: Diagnosis not present

## 2017-11-15 DIAGNOSIS — N3281 Overactive bladder: Secondary | ICD-10-CM | POA: Diagnosis not present

## 2017-11-15 DIAGNOSIS — N183 Chronic kidney disease, stage 3 (moderate): Secondary | ICD-10-CM | POA: Diagnosis not present

## 2017-11-15 DIAGNOSIS — M1990 Unspecified osteoarthritis, unspecified site: Secondary | ICD-10-CM | POA: Diagnosis not present

## 2017-11-15 DIAGNOSIS — E1165 Type 2 diabetes mellitus with hyperglycemia: Secondary | ICD-10-CM | POA: Diagnosis not present

## 2017-11-15 DIAGNOSIS — Z86010 Personal history of colonic polyps: Secondary | ICD-10-CM | POA: Diagnosis not present

## 2017-11-15 DIAGNOSIS — R5383 Other fatigue: Secondary | ICD-10-CM | POA: Diagnosis present

## 2017-11-15 DIAGNOSIS — K219 Gastro-esophageal reflux disease without esophagitis: Secondary | ICD-10-CM | POA: Diagnosis not present

## 2017-11-15 LAB — BASIC METABOLIC PANEL
Anion gap: 9 (ref 5–15)
BUN: 22 mg/dL — ABNORMAL HIGH (ref 6–20)
CO2: 23 mmol/L (ref 22–32)
Calcium: 9.8 mg/dL (ref 8.9–10.3)
Chloride: 103 mmol/L (ref 101–111)
Creatinine, Ser: 1.07 mg/dL — ABNORMAL HIGH (ref 0.44–1.00)
GFR calc Af Amer: 57 mL/min — ABNORMAL LOW (ref >60)
GFR calc non Af Amer: 49 mL/min — ABNORMAL LOW (ref >60)
Glucose, Bld: 197 mg/dL — ABNORMAL HIGH (ref 65–99)
Potassium: 5.7 mmol/L — ABNORMAL HIGH (ref 3.5–5.1)
Sodium: 135 mmol/L (ref 135–145)

## 2017-11-15 LAB — CBC
HCT: 42.7 % (ref 36.0–46.0)
Hemoglobin: 13.6 g/dL (ref 12.0–15.0)
MCH: 31.7 pg (ref 26.0–34.0)
MCHC: 31.9 g/dL (ref 30.0–36.0)
MCV: 99.5 fL (ref 78.0–100.0)
Platelets: 441 10*3/uL — ABNORMAL HIGH (ref 150–400)
RBC: 4.29 MIL/uL (ref 3.87–5.11)
RDW: 19.2 % — ABNORMAL HIGH (ref 11.5–15.5)
WBC: 11.8 10*3/uL — ABNORMAL HIGH (ref 4.0–10.5)

## 2017-11-15 NOTE — ED Triage Notes (Signed)
Pt had blood work done this morning at [facility], was contacted to return to ER for a potassium >7.0. Pt reports feeling bad for the past few weeks and has been frequenting her PCP for concerns. Pt denies complaints, no sob or cp

## 2017-11-16 DIAGNOSIS — E875 Hyperkalemia: Secondary | ICD-10-CM | POA: Diagnosis not present

## 2017-11-16 LAB — CBG MONITORING, ED: Glucose-Capillary: 108 mg/dL — ABNORMAL HIGH (ref 65–99)

## 2017-11-16 MED ORDER — SODIUM CHLORIDE 0.9 % IV BOLUS (SEPSIS)
500.0000 mL | Freq: Once | INTRAVENOUS | Status: AC
  Administered 2017-11-16: 500 mL via INTRAVENOUS

## 2017-11-16 MED ORDER — SODIUM POLYSTYRENE SULFONATE 15 GM/60ML PO SUSP
15.0000 g | Freq: Once | ORAL | Status: AC
  Administered 2017-11-16: 15 g via ORAL
  Filled 2017-11-16: qty 60

## 2017-11-16 NOTE — Discharge Instructions (Signed)
Stop taking your naproxen and amiloride. Follow-up with your primary care provider for further evaluation. Return to ED for worsening symptoms, chest pain, shortness of breath, vision changes, numbness in arms or legs, injuries or falls.

## 2017-11-16 NOTE — ED Provider Notes (Signed)
[facility] Provider Note   CSN: [id number] Arrival date & time: 11/15/17  1748     History   Chief Complaint Chief Complaint  Patient presents with  . Abnormal Lab    HPI Dorothy Anderson is a 77 y.o. female with a past medical history of HTN, GERD, presents to ED for evaluation of abnormal labwork. She states she went for an initial visit at her new PCP's office this morning, lab work was done, and she was called in the afternoon to be informed of her potassium being critically high at >7.0. She has been feeling very fatigued, nauseous, with decreased energy for the past 3 months. She thought that this was due to some of her medications. She has been working with her PCP to figure it out. Cannot recall any inciting events that caused her to become fatigued. Denies chest pain, shortness of breath, hemoptysis, URI symptoms, abdominal pain, vomiting, fevers, prior MI, DVT PE, numbness in legs, headaches, vision changes, numbness in extremities, changes in memory.  HPI  Past Medical History:  Diagnosis Date  . Arthritis KNEES   OA  . Calculus of left kidney   . Complication of anesthesia    slow to wake up  . Diabetes mellitus without complication ([place])    PRE DIABETIC ORAL MEDS   . Frequency of urination   . GERD (gastroesophageal reflux disease)   . Glaucoma BILATERAL EYES  . History of kidney stones   . Hydronephrosis, left    DR. STEVEN DAHLSTADT  . Hyperglycemia   . Hypertension   . Nocturia   . Wears glasses     Patient Active Problem List   Diagnosis Date Noted  . Osteoarthritis of left knee 01/17/2013    Class: Diagnosis of    Past Surgical History:  Procedure Laterality Date  . CARPAL TUNNEL RELEASE  06-17-2005   RIGHT  . CATARACT EXTRACTION W/ INTRAOCULAR LENS  IMPLANT, BILATERAL    . CYSTO/ LEFT RETROGRADE PYELOGRAM/ LEFT URETERAL STENT PLACEMENT  04-15-2005   URETERAL STONE  . CYSTOSCOPY/RETROGRADE/URETEROSCOPY   01/27/2012   Procedure: CYSTOSCOPY/RETROGRADE/URETEROSCOPY;  Surgeon: Franchot Gallo, MD;  Location: [facility];  Service: Urology;  Laterality: Right;  . EXTRACORPOREAL SHOCK WAVE LITHOTRIPSY  11-09-11   RIGHT  . TONSILLECTOMY AND ADENOIDECTOMY  AGE 38  . TOTAL KNEE ARTHROPLASTY  09-29-2005   RIGHT  . TOTAL KNEE ARTHROPLASTY Left 01/17/2013   Procedure: TOTAL KNEE ARTHROPLASTY;  Surgeon: Meredith Pel, MD;  Location: [facility];  Service: Orthopedics;  Laterality: Left;  Left Total Knee Arthroplasty    OB History    No data available       Home Medications    Prior to Admission medications   Medication Sig Start Date End Date Taking? Authorizing Provider  aspirin EC 81 MG tablet Take 81 mg by mouth at bedtime.    Yes [provider]  CALCIUM-VITAMIN D PO Take 1 tablet by mouth daily.   Yes [provider]  cholecalciferol (VITAMIN D) 1000 UNITS tablet Take 1,000 Units by mouth daily.    Yes [provider]  docusate sodium (COLACE) 100 MG capsule Take 100 mg by mouth 2 (two) times daily.    Yes [provider]  HYDROcodone-acetaminophen (NORCO/VICODIN) 5-325 MG tablet Take 1 tablet by mouth See admin instructions. Take twice daily. Can take a third as needed for pain 03/02/16  Yes [provider]  latanoprost (XALATAN) 0.005 % ophthalmic solution Place  1 drop into both eyes at bedtime.    Yes [provider]  magnesium oxide (MAG-OX) 400 (241.3 Mg) MG tablet Take 400 mg by mouth daily.   Yes [provider]  metoprolol tartrate (LOPRESSOR) 25 MG tablet Take 25 mg by mouth 2 (two) times daily.    Yes [provider]  Multiple Vitamins-Minerals (MULTIVITAMINS THER. W/MINERALS) TABS Take 1 tablet by mouth daily.    Yes [provider]  oxybutynin (DITROPAN) 5 MG tablet 5 mg 3 (three) times daily as needed for bladder spasms.  12/12/15  Yes [provider]  aMILoride (MIDAMOR) 5 MG  tablet Take 5 mg by mouth daily.    [provider]  lansoprazole (PREVACID) 15 MG capsule Take 15 mg by mouth 2 (two) times daily.     [provider]  metFORMIN (GLUCOPHAGE-XR) 500 MG 24 hr tablet Take 500 mg by mouth daily with breakfast.    [provider]  naproxen (NAPROSYN) 500 MG tablet Take 500 mg by mouth 2 (two) times daily with a meal.     [provider]    Family History Family History  Problem Relation Age of Onset  . CVA Mother   . Heart attack Father     Social History Social History   Tobacco Use  . Smoking status: Never Smoker  . Smokeless tobacco: Never Used  Substance Use Topics  . Alcohol use: No  . Drug use: No     Allergies   Darvon; Macrobid [nitrofurantoin monohydrate macrocrystals]; Nitrofurantoin; and Propoxyphene   Review of Systems Review of Systems  Constitutional: Positive for fatigue. Negative for appetite change, chills and fever.  HENT: Negative for ear pain, rhinorrhea, sneezing and sore throat.   Eyes: Negative for photophobia and visual disturbance.  Respiratory: Negative for cough, chest tightness, shortness of breath and wheezing.   Cardiovascular: Negative for chest pain and palpitations.  Gastrointestinal: Positive for nausea. Negative for abdominal pain, blood in stool, constipation, diarrhea and vomiting.  Genitourinary: Negative for dysuria, hematuria and urgency.  Musculoskeletal: Negative for myalgias.  Skin: Negative for rash.  Neurological: Negative for dizziness, seizures, syncope, speech difficulty, weakness, light-headedness and headaches.     Physical Exam Updated Vital Signs BP (!) 141/81   Pulse 88   Temp 98.2 F (36.8 C) (Oral)   Resp 18   SpO2 98%   Physical Exam  Constitutional: She is oriented to person, place, and time. She appears well-developed and well-nourished. No distress.  Nontoxic appearing and in no acute distress. Pleasant. Speaking in complete sentences  without difficulty.  HENT:  Head: Normocephalic and atraumatic.  Nose: Nose normal.  Eyes: Conjunctivae and EOM are normal. Right eye exhibits no discharge. Left eye exhibits no discharge. No scleral icterus.  Neck: Normal range of motion. Neck supple.  Cardiovascular: Normal rate, regular rhythm, normal heart sounds and intact distal pulses. Exam reveals no gallop and no friction rub.  No murmur heard. Pulmonary/Chest: Effort normal and breath sounds normal. No respiratory distress.  Abdominal: Soft. Bowel sounds are normal. She exhibits no distension. There is no tenderness. There is no guarding.  No abdominal tenderness to palpation.  Musculoskeletal: Normal range of motion. She exhibits no edema.  No BLE edema, erythema or calf tenderness.  Neurological: She is alert and oriented to person, place, and time. No cranial nerve deficit or sensory deficit. She exhibits normal muscle tone. Coordination normal.  Pupils reactive. No facial asymmetry noted. Cranial nerves appear grossly intact. Sensation intact  to light touch on face, BUE and BLE. Strength 5/5 in BUE and BLE. Normal finger to nose coordination bilaterally.  Skin: Skin is warm and dry. No rash noted.  Psychiatric: She has a normal mood and affect.  Nursing note and vitals reviewed.    ED Treatments / Results  Labs (all labs ordered are listed, but only abnormal results are displayed) Labs Reviewed  BASIC METABOLIC PANEL - Abnormal; Notable for the following components:      Result Value   Potassium 5.7 (*)    Glucose, Bld 197 (*)    BUN 22 (*)    Creatinine, Ser 1.07 (*)    GFR calc non Af Amer 49 (*)    GFR calc Af Amer 57 (*)    All other components within normal limits  CBC - Abnormal; Notable for the following components:   WBC 11.8 (*)    RDW 19.2 (*)    Platelets 441 (*)    All other components within normal limits  CBG MONITORING, ED - Abnormal; Notable for the following components:   Glucose-Capillary 108  (*)    All other components within normal limits    EKG  EKG Interpretation  Date/Time:  Monday November 15 2017 19:16:09 EST Ventricular Rate:  102 PR Interval:  132 QRS Duration: 80 QT Interval:  334 QTC Calculation: 435 R Axis:   -2 Text Interpretation:  Sinus tachycardia Cannot rule out Anterior infarct , age undetermined Abnormal ECG When compared with ECG of 01/27/2012, HEART RATE has increased Confirmed by Delora Fuel ([id number]) on 11/16/2017 12:15:30 AM       Radiology No results found.  Procedures Procedures (including critical care time)  Medications Ordered in ED Medications  sodium chloride 0.9 % bolus 500 mL (0 mLs Intravenous Stopped 11/16/17 0136)  sodium polystyrene (KAYEXALATE) 15 GM/60ML suspension 15 g (15 g Oral Given 11/16/17 0139)     Initial Impression / Assessment and Plan / ED Course  I have reviewed the triage vital signs and the nursing notes.  Pertinent labs & imaging results that were available during my care of the patient were reviewed by me and considered in my medical decision making (see chart for details).     Patient, with a past medical history of hypertension, GERD, presents to ED for evaluation of abnormal lab work.  She went for initial visit at her new PCPs office this morning and she was called in the afternoon to be informed of her potassium being critically high at greater than 7.0.  She has been establishing she has been feeling generally fatigued, nauseous and decreased energy for the past 3 months.  On physical exam patient is overall well-appearing.  She has no abdominal or chest tenderness to palpation.  She has no deficits on her neurological exam.  She states that she has been on naproxen and amiloride for the past several years.  Cannot recall any previous history of hyperkalemia.  EKG shows no peak T waves or other changes.  Patient given fluids, Kayexalate and will advise her to discontinue her naproxen and amiloride.  Advised to  follow-up with her primary care provider for further evaluation.  Patient appears stable for discharge at this time.  Strict return precautions given.  Patient discussed with and seen by Dr. Roxanne Mins.  Final Clinical Impressions(s) / ED Diagnoses   Final diagnoses:  Hyperkalemia    ED Discharge Orders    None     Portions of this note were generated with  Lobbyist. Dictation errors may occur despite best attempts at proofreading.    Delia Heady, PA-C 45/85/92 [id number]    Delora Fuel, MD 62/86/38 [phone]

## 2017-11-16 NOTE — ED Notes (Signed)
Patient Alert and oriented X4. Stable and ambulatory. Patient verbalized understanding of the discharge instructions.  Patient belongings were taken by the patient.  

## 2017-11-18 DIAGNOSIS — R634 Abnormal weight loss: Secondary | ICD-10-CM | POA: Diagnosis not present

## 2017-11-18 DIAGNOSIS — E875 Hyperkalemia: Secondary | ICD-10-CM | POA: Diagnosis not present

## 2017-11-18 DIAGNOSIS — E119 Type 2 diabetes mellitus without complications: Secondary | ICD-10-CM | POA: Diagnosis not present

## 2017-11-18 DIAGNOSIS — R8290 Unspecified abnormal findings in urine: Secondary | ICD-10-CM | POA: Diagnosis not present

## 2017-11-29 DIAGNOSIS — E1165 Type 2 diabetes mellitus with hyperglycemia: Secondary | ICD-10-CM | POA: Diagnosis not present

## 2017-11-29 DIAGNOSIS — N183 Chronic kidney disease, stage 3 (moderate): Secondary | ICD-10-CM | POA: Diagnosis not present

## 2017-11-29 DIAGNOSIS — R634 Abnormal weight loss: Secondary | ICD-10-CM | POA: Diagnosis not present

## 2017-11-29 DIAGNOSIS — N289 Disorder of kidney and ureter, unspecified: Secondary | ICD-10-CM | POA: Diagnosis not present

## 2017-11-29 DIAGNOSIS — E119 Type 2 diabetes mellitus without complications: Secondary | ICD-10-CM | POA: Diagnosis not present

## 2017-12-01 DIAGNOSIS — H04123 Dry eye syndrome of bilateral lacrimal glands: Secondary | ICD-10-CM | POA: Diagnosis not present

## 2017-12-01 DIAGNOSIS — H401131 Primary open-angle glaucoma, bilateral, mild stage: Secondary | ICD-10-CM | POA: Diagnosis not present

## 2017-12-01 DIAGNOSIS — H35373 Puckering of macula, bilateral: Secondary | ICD-10-CM | POA: Diagnosis not present

## 2017-12-01 DIAGNOSIS — Z961 Presence of intraocular lens: Secondary | ICD-10-CM | POA: Diagnosis not present

## 2017-12-07 DIAGNOSIS — R634 Abnormal weight loss: Secondary | ICD-10-CM | POA: Diagnosis not present

## 2017-12-07 DIAGNOSIS — Z6832 Body mass index (BMI) 32.0-32.9, adult: Secondary | ICD-10-CM | POA: Diagnosis not present

## 2017-12-22 DIAGNOSIS — H35373 Puckering of macula, bilateral: Secondary | ICD-10-CM | POA: Diagnosis not present

## 2017-12-22 DIAGNOSIS — H04123 Dry eye syndrome of bilateral lacrimal glands: Secondary | ICD-10-CM | POA: Diagnosis not present

## 2017-12-22 DIAGNOSIS — Z961 Presence of intraocular lens: Secondary | ICD-10-CM | POA: Diagnosis not present

## 2017-12-22 DIAGNOSIS — H401131 Primary open-angle glaucoma, bilateral, mild stage: Secondary | ICD-10-CM | POA: Diagnosis not present

## 2017-12-23 DIAGNOSIS — N182 Chronic kidney disease, stage 2 (mild): Secondary | ICD-10-CM | POA: Diagnosis not present

## 2017-12-23 DIAGNOSIS — M5136 Other intervertebral disc degeneration, lumbar region: Secondary | ICD-10-CM | POA: Diagnosis not present

## 2017-12-23 DIAGNOSIS — M150 Primary generalized (osteo)arthritis: Secondary | ICD-10-CM | POA: Diagnosis not present

## 2017-12-23 DIAGNOSIS — E669 Obesity, unspecified: Secondary | ICD-10-CM | POA: Diagnosis not present

## 2017-12-23 DIAGNOSIS — Z6833 Body mass index (BMI) 33.0-33.9, adult: Secondary | ICD-10-CM | POA: Diagnosis not present

## 2017-12-23 DIAGNOSIS — M8580 Other specified disorders of bone density and structure, unspecified site: Secondary | ICD-10-CM | POA: Diagnosis not present

## 2017-12-27 DIAGNOSIS — E875 Hyperkalemia: Secondary | ICD-10-CM | POA: Diagnosis not present

## 2017-12-27 DIAGNOSIS — E119 Type 2 diabetes mellitus without complications: Secondary | ICD-10-CM | POA: Diagnosis not present

## 2018-02-16 DIAGNOSIS — H401131 Primary open-angle glaucoma, bilateral, mild stage: Secondary | ICD-10-CM | POA: Diagnosis not present

## 2018-02-16 DIAGNOSIS — H35373 Puckering of macula, bilateral: Secondary | ICD-10-CM | POA: Diagnosis not present

## 2018-02-16 DIAGNOSIS — H04123 Dry eye syndrome of bilateral lacrimal glands: Secondary | ICD-10-CM | POA: Diagnosis not present

## 2018-03-02 DIAGNOSIS — E119 Type 2 diabetes mellitus without complications: Secondary | ICD-10-CM | POA: Diagnosis not present

## 2018-03-02 DIAGNOSIS — N3281 Overactive bladder: Secondary | ICD-10-CM | POA: Diagnosis not present

## 2018-03-02 DIAGNOSIS — K219 Gastro-esophageal reflux disease without esophagitis: Secondary | ICD-10-CM | POA: Diagnosis not present

## 2018-03-02 DIAGNOSIS — I10 Essential (primary) hypertension: Secondary | ICD-10-CM | POA: Diagnosis not present

## 2018-03-02 DIAGNOSIS — N183 Chronic kidney disease, stage 3 (moderate): Secondary | ICD-10-CM | POA: Diagnosis not present

## 2018-03-23 DIAGNOSIS — M8580 Other specified disorders of bone density and structure, unspecified site: Secondary | ICD-10-CM | POA: Diagnosis not present

## 2018-03-23 DIAGNOSIS — M150 Primary generalized (osteo)arthritis: Secondary | ICD-10-CM | POA: Diagnosis not present

## 2018-03-23 DIAGNOSIS — M5136 Other intervertebral disc degeneration, lumbar region: Secondary | ICD-10-CM | POA: Diagnosis not present

## 2018-03-23 DIAGNOSIS — N182 Chronic kidney disease, stage 2 (mild): Secondary | ICD-10-CM | POA: Diagnosis not present

## 2018-03-23 DIAGNOSIS — Z6831 Body mass index (BMI) 31.0-31.9, adult: Secondary | ICD-10-CM | POA: Diagnosis not present

## 2018-04-06 DIAGNOSIS — E875 Hyperkalemia: Secondary | ICD-10-CM | POA: Diagnosis not present

## 2018-04-06 DIAGNOSIS — I129 Hypertensive chronic kidney disease with stage 1 through stage 4 chronic kidney disease, or unspecified chronic kidney disease: Secondary | ICD-10-CM | POA: Diagnosis not present

## 2018-04-06 DIAGNOSIS — N183 Chronic kidney disease, stage 3 (moderate): Secondary | ICD-10-CM | POA: Diagnosis not present

## 2018-06-22 DIAGNOSIS — N182 Chronic kidney disease, stage 2 (mild): Secondary | ICD-10-CM | POA: Diagnosis not present

## 2018-06-22 DIAGNOSIS — H35373 Puckering of macula, bilateral: Secondary | ICD-10-CM | POA: Diagnosis not present

## 2018-06-22 DIAGNOSIS — Z961 Presence of intraocular lens: Secondary | ICD-10-CM | POA: Diagnosis not present

## 2018-06-22 DIAGNOSIS — H401131 Primary open-angle glaucoma, bilateral, mild stage: Secondary | ICD-10-CM | POA: Diagnosis not present

## 2018-06-22 DIAGNOSIS — M150 Primary generalized (osteo)arthritis: Secondary | ICD-10-CM | POA: Diagnosis not present

## 2018-06-22 DIAGNOSIS — M5136 Other intervertebral disc degeneration, lumbar region: Secondary | ICD-10-CM | POA: Diagnosis not present

## 2018-06-22 DIAGNOSIS — M8580 Other specified disorders of bone density and structure, unspecified site: Secondary | ICD-10-CM | POA: Diagnosis not present

## 2018-06-22 DIAGNOSIS — H04123 Dry eye syndrome of bilateral lacrimal glands: Secondary | ICD-10-CM | POA: Diagnosis not present

## 2018-06-22 DIAGNOSIS — Z6831 Body mass index (BMI) 31.0-31.9, adult: Secondary | ICD-10-CM | POA: Diagnosis not present

## 2018-06-27 DIAGNOSIS — R05 Cough: Secondary | ICD-10-CM | POA: Diagnosis not present

## 2018-06-27 DIAGNOSIS — E875 Hyperkalemia: Secondary | ICD-10-CM | POA: Diagnosis not present

## 2018-06-27 DIAGNOSIS — J209 Acute bronchitis, unspecified: Secondary | ICD-10-CM | POA: Diagnosis not present

## 2018-06-27 DIAGNOSIS — Z7984 Long term (current) use of oral hypoglycemic drugs: Secondary | ICD-10-CM | POA: Diagnosis not present

## 2018-06-27 DIAGNOSIS — J0100 Acute maxillary sinusitis, unspecified: Secondary | ICD-10-CM | POA: Diagnosis not present

## 2018-06-27 DIAGNOSIS — Z79899 Other long term (current) drug therapy: Secondary | ICD-10-CM | POA: Diagnosis not present

## 2018-06-27 DIAGNOSIS — E119 Type 2 diabetes mellitus without complications: Secondary | ICD-10-CM | POA: Diagnosis not present

## 2018-07-25 DIAGNOSIS — I129 Hypertensive chronic kidney disease with stage 1 through stage 4 chronic kidney disease, or unspecified chronic kidney disease: Secondary | ICD-10-CM | POA: Diagnosis not present

## 2018-07-25 DIAGNOSIS — N183 Chronic kidney disease, stage 3 (moderate): Secondary | ICD-10-CM | POA: Diagnosis not present

## 2018-07-25 DIAGNOSIS — Z23 Encounter for immunization: Secondary | ICD-10-CM | POA: Diagnosis not present

## 2018-07-25 DIAGNOSIS — E875 Hyperkalemia: Secondary | ICD-10-CM | POA: Diagnosis not present

## 2018-09-26 DIAGNOSIS — M5136 Other intervertebral disc degeneration, lumbar region: Secondary | ICD-10-CM | POA: Diagnosis not present

## 2018-09-26 DIAGNOSIS — Z6830 Body mass index (BMI) 30.0-30.9, adult: Secondary | ICD-10-CM | POA: Diagnosis not present

## 2018-09-26 DIAGNOSIS — M8580 Other specified disorders of bone density and structure, unspecified site: Secondary | ICD-10-CM | POA: Diagnosis not present

## 2018-09-26 DIAGNOSIS — N182 Chronic kidney disease, stage 2 (mild): Secondary | ICD-10-CM | POA: Diagnosis not present

## 2018-09-26 DIAGNOSIS — M150 Primary generalized (osteo)arthritis: Secondary | ICD-10-CM | POA: Diagnosis not present

## 2018-09-26 DIAGNOSIS — R29898 Other symptoms and signs involving the musculoskeletal system: Secondary | ICD-10-CM | POA: Diagnosis not present

## 2018-09-28 DIAGNOSIS — N3281 Overactive bladder: Secondary | ICD-10-CM | POA: Diagnosis not present

## 2018-09-28 DIAGNOSIS — N3000 Acute cystitis without hematuria: Secondary | ICD-10-CM | POA: Diagnosis not present

## 2018-09-28 DIAGNOSIS — R825 Elevated urine levels of drugs, medicaments and biological substances: Secondary | ICD-10-CM | POA: Diagnosis not present

## 2018-10-19 ENCOUNTER — Other Ambulatory Visit: Payer: Self-pay | Admitting: Internal Medicine

## 2018-10-19 ENCOUNTER — Ambulatory Visit
Admission: RE | Admit: 2018-10-19 | Discharge: 2018-10-19 | Disposition: A | Discharge status: Home / Self Care | Payer: Medicare Other | Source: Ambulatory Visit | Attending: Internal Medicine | Admitting: Internal Medicine

## 2018-10-19 DIAGNOSIS — K219 Gastro-esophageal reflux disease without esophagitis: Secondary | ICD-10-CM | POA: Diagnosis not present

## 2018-10-19 DIAGNOSIS — R05 Cough: Secondary | ICD-10-CM

## 2018-10-19 DIAGNOSIS — E1165 Type 2 diabetes mellitus with hyperglycemia: Secondary | ICD-10-CM | POA: Diagnosis not present

## 2018-10-19 DIAGNOSIS — J209 Acute bronchitis, unspecified: Secondary | ICD-10-CM | POA: Diagnosis not present

## 2018-10-19 DIAGNOSIS — N3281 Overactive bladder: Secondary | ICD-10-CM | POA: Diagnosis not present

## 2018-10-19 DIAGNOSIS — R059 Cough, unspecified: Secondary | ICD-10-CM

## 2018-10-19 DIAGNOSIS — Z1389 Encounter for screening for other disorder: Secondary | ICD-10-CM | POA: Diagnosis not present

## 2018-10-19 DIAGNOSIS — E119 Type 2 diabetes mellitus without complications: Secondary | ICD-10-CM | POA: Diagnosis not present

## 2018-10-19 DIAGNOSIS — Z0000 Encounter for general adult medical examination without abnormal findings: Secondary | ICD-10-CM | POA: Diagnosis not present

## 2018-10-19 DIAGNOSIS — I10 Essential (primary) hypertension: Secondary | ICD-10-CM | POA: Diagnosis not present

## 2018-10-21 DIAGNOSIS — H401131 Primary open-angle glaucoma, bilateral, mild stage: Secondary | ICD-10-CM | POA: Diagnosis not present

## 2018-10-26 DIAGNOSIS — H401131 Primary open-angle glaucoma, bilateral, mild stage: Secondary | ICD-10-CM | POA: Diagnosis not present

## 2018-10-26 DIAGNOSIS — Z961 Presence of intraocular lens: Secondary | ICD-10-CM | POA: Diagnosis not present

## 2018-10-26 DIAGNOSIS — H04123 Dry eye syndrome of bilateral lacrimal glands: Secondary | ICD-10-CM | POA: Diagnosis not present

## 2018-10-26 DIAGNOSIS — H35373 Puckering of macula, bilateral: Secondary | ICD-10-CM | POA: Diagnosis not present

## 2018-11-17 DIAGNOSIS — L720 Epidermal cyst: Secondary | ICD-10-CM | POA: Diagnosis not present

## 2018-11-17 DIAGNOSIS — D225 Melanocytic nevi of trunk: Secondary | ICD-10-CM | POA: Diagnosis not present

## 2018-11-17 DIAGNOSIS — L820 Inflamed seborrheic keratosis: Secondary | ICD-10-CM | POA: Diagnosis not present

## 2018-11-17 DIAGNOSIS — D1801 Hemangioma of skin and subcutaneous tissue: Secondary | ICD-10-CM | POA: Diagnosis not present

## 2018-12-27 DIAGNOSIS — E119 Type 2 diabetes mellitus without complications: Secondary | ICD-10-CM | POA: Diagnosis not present

## 2018-12-27 DIAGNOSIS — Z789 Other specified health status: Secondary | ICD-10-CM | POA: Diagnosis not present

## 2018-12-27 DIAGNOSIS — Z5181 Encounter for therapeutic drug level monitoring: Secondary | ICD-10-CM | POA: Diagnosis not present

## 2018-12-28 DIAGNOSIS — Z6830 Body mass index (BMI) 30.0-30.9, adult: Secondary | ICD-10-CM | POA: Diagnosis not present

## 2018-12-28 DIAGNOSIS — E669 Obesity, unspecified: Secondary | ICD-10-CM | POA: Diagnosis not present

## 2018-12-28 DIAGNOSIS — R29898 Other symptoms and signs involving the musculoskeletal system: Secondary | ICD-10-CM | POA: Diagnosis not present

## 2018-12-28 DIAGNOSIS — M8580 Other specified disorders of bone density and structure, unspecified site: Secondary | ICD-10-CM | POA: Diagnosis not present

## 2018-12-28 DIAGNOSIS — M5136 Other intervertebral disc degeneration, lumbar region: Secondary | ICD-10-CM | POA: Diagnosis not present

## 2018-12-28 DIAGNOSIS — N182 Chronic kidney disease, stage 2 (mild): Secondary | ICD-10-CM | POA: Diagnosis not present

## 2018-12-28 DIAGNOSIS — M150 Primary generalized (osteo)arthritis: Secondary | ICD-10-CM | POA: Diagnosis not present

## 2019-01-11 ENCOUNTER — Encounter: Payer: Self-pay | Admitting: Podiatry

## 2019-01-11 ENCOUNTER — Ambulatory Visit (INDEPENDENT_AMBULATORY_CARE_PROVIDER_SITE_OTHER): Payer: Medicare Other

## 2019-01-11 ENCOUNTER — Ambulatory Visit: Payer: Medicare Other | Admitting: Podiatry

## 2019-01-11 ENCOUNTER — Ambulatory Visit (INDEPENDENT_AMBULATORY_CARE_PROVIDER_SITE_OTHER): Payer: Medicare Other | Admitting: Podiatry

## 2019-01-11 VITALS — BP 150/71 | HR 63 | Resp 16

## 2019-01-11 DIAGNOSIS — M2041 Other hammer toe(s) (acquired), right foot: Secondary | ICD-10-CM

## 2019-01-11 DIAGNOSIS — M79674 Pain in right toe(s): Secondary | ICD-10-CM

## 2019-01-11 DIAGNOSIS — B351 Tinea unguium: Secondary | ICD-10-CM | POA: Diagnosis not present

## 2019-01-11 DIAGNOSIS — M2042 Other hammer toe(s) (acquired), left foot: Secondary | ICD-10-CM

## 2019-01-11 DIAGNOSIS — M79675 Pain in left toe(s): Secondary | ICD-10-CM

## 2019-01-11 NOTE — Progress Notes (Signed)
   Subjective:    Patient ID: Dorothy Anderson, female    DOB: [birth year], 78 y.o.   MRN: [id number]  HPI    Review of Systems  All other systems reviewed and are negative.      Objective:   Physical Exam        Assessment & Plan:

## 2019-01-11 NOTE — Progress Notes (Signed)
Subjective:   Patient ID: Dorothy Anderson, female   DOB: 78 y.o.   MRN: [id number]   HPI Patient presents stating I wanted to have my foot checked I have hammertoe deformities and also I have long-term diabetes with nail disease that my husband cuts but I know he should do.  Patient does not smoke   Review of Systems  All other systems reviewed and are negative.       Objective:  Physical Exam Vitals signs and nursing note reviewed.  Constitutional:      Appearance: She is well-developed.  Pulmonary:     Effort: Pulmonary effort is normal.  Musculoskeletal: Normal range of motion.  Skin:    General: Skin is warm.  Neurological:     Mental Status: She is alert.     Vascular status found to be mildly diminished with diminished sharp dull vibratory noted of mild nature.  Patient is found to have significant rigid contracture digit to left over right with keratotic tissue and slight irritation of the left second toe.  Patient has nail disease 1-5 both feet that are incurvated and mildly tender when palpated     Assessment:  Diabetic is overall doing well but does have risk factors with mycotic nail infection moderately tender and hammertoe deformity     Plan:  H&P x-rays reviewed condition discussed and education concerning diabetic care was rendered to patient.  Today I debrided nailbeds 1-5 both feet which will be done routinely and overall patient will be watched and hopefully will not develop further problems  Rigid contracture digit to left over right with mild contracture digit 3

## 2019-01-11 NOTE — Patient Instructions (Signed)
Hammer Toe  Hammer toe is a change in the shape (a deformity) of your toe. The deformity causes the middle joint of your toe to stay bent. This causes pain, especially when you are wearing shoes. Hammer toe starts gradually. At first, the toe can be straightened. Gradually over time, the deformity becomes stiff and permanent. Early treatments to keep the toe straight may relieve pain. As the deformity becomes stiff and permanent, surgery may be needed to straighten the toe. What are the causes? Hammer toe is caused by abnormal bending of the toe joint that is closest to your foot. It happens gradually over time. This pulls on the muscles and connections (tendons) of the toe joint, making them weak and stiff. It is often related to wearing shoes that are too short or narrow and do not let your toes straighten. What increases the risk? You may be at greater risk for hammer toe if you:  Are female.  Are older.  Wear shoes that are too small.  Wear high-heeled shoes that pinch your toes.  Are a ballet dancer.  Have a second toe that is longer than your big toe (first toe).  Injure your foot or toe.  Have arthritis.  Have a family history of hammer toe.  Have a nerve or muscle disorder. What are the signs or symptoms? The main symptoms of this condition are pain and deformity of the toe. The pain is worse when wearing shoes, walking, or running. Other symptoms may include:  Corns or calluses over the bent part of the toe or between the toes.  Redness and a burning feeling on the toe.  An open sore that forms on the top of the toe.  Not being able to straighten the toe. How is this diagnosed? This condition is diagnosed based on your symptoms and a physical exam. During the exam, your health care provider will try to straighten your toe to see how stiff the deformity is. You may also have tests, such as:  A blood test to check for rheumatoid arthritis.  An X-ray to show how  severe the deformity is. How is this treated? Treatment for this condition will depend on how stiff the deformity is. Surgery is often needed. However, sometimes a hammer toe can be straightened without surgery. Treatments that do not involve surgery include:  Taping the toe into a straightened position.  Using pads and cushions to protect the toe (orthotics).  Wearing shoes that provide enough room for the toes.  Doing toe-stretching exercises at home.  Taking an NSAID to reduce pain and swelling. If these treatments do not help or the toe cannot be straightened, surgery is the next option. The most common surgeries used to straighten a hammer toe include:  Arthroplasty. In this procedure, part of the joint is removed, and that allows the toe to straighten.  Fusion. In this procedure, cartilage between the two bones of the joint is taken out and the bones are fused together into one longer bone.  Implantation. In this procedure, part of the bone is removed and replaced with an implant to let the toe move again.  Flexor tendon transfer. In this procedure, the tendons that curl the toes down (flexor tendons) are repositioned. Follow these instructions at home:  Take over-the-counter and prescription medicines only as told by your health care provider.  Do toe straightening and stretching exercises as told by your health care provider.  Keep all follow-up visits as told by your health care   provider. This is important. How is this prevented?  Wear shoes that give your toes enough room and do not cause pain.  Do not wear high-heeled shoes. Contact a health care provider if:  Your pain gets worse.  Your toe becomes red or swollen.  You develop an open sore on your toe. This information is not intended to replace advice given to you by your health care provider. Make sure you discuss any questions you have with your health care provider. Document Released: 10/23/2000 Document  Revised: 05/24/2017 Document Reviewed: 02/19/2016 Elsevier Interactive Patient Education  2019 Elsevier Inc.  

## 2019-01-13 ENCOUNTER — Other Ambulatory Visit: Payer: Self-pay | Admitting: *Deleted

## 2019-03-03 DIAGNOSIS — N3000 Acute cystitis without hematuria: Secondary | ICD-10-CM | POA: Diagnosis not present

## 2019-03-06 DIAGNOSIS — N3000 Acute cystitis without hematuria: Secondary | ICD-10-CM | POA: Diagnosis not present

## 2019-03-28 DIAGNOSIS — R29898 Other symptoms and signs involving the musculoskeletal system: Secondary | ICD-10-CM | POA: Diagnosis not present

## 2019-03-28 DIAGNOSIS — M150 Primary generalized (osteo)arthritis: Secondary | ICD-10-CM | POA: Diagnosis not present

## 2019-03-28 DIAGNOSIS — M8580 Other specified disorders of bone density and structure, unspecified site: Secondary | ICD-10-CM | POA: Diagnosis not present

## 2019-03-28 DIAGNOSIS — M5136 Other intervertebral disc degeneration, lumbar region: Secondary | ICD-10-CM | POA: Diagnosis not present

## 2019-03-28 DIAGNOSIS — N182 Chronic kidney disease, stage 2 (mild): Secondary | ICD-10-CM | POA: Diagnosis not present

## 2019-04-10 DIAGNOSIS — H04123 Dry eye syndrome of bilateral lacrimal glands: Secondary | ICD-10-CM | POA: Diagnosis not present

## 2019-04-10 DIAGNOSIS — H35373 Puckering of macula, bilateral: Secondary | ICD-10-CM | POA: Diagnosis not present

## 2019-04-10 DIAGNOSIS — Z961 Presence of intraocular lens: Secondary | ICD-10-CM | POA: Diagnosis not present

## 2019-04-10 DIAGNOSIS — H401131 Primary open-angle glaucoma, bilateral, mild stage: Secondary | ICD-10-CM | POA: Diagnosis not present

## 2019-04-14 ENCOUNTER — Emergency Department (HOSPITAL_COMMUNITY)
Admission: EM | Admit: 2019-04-14 | Discharge: 2019-04-14 | Disposition: A | Discharge status: Home / Self Care | Payer: Medicare Other | Attending: Emergency Medicine | Admitting: Emergency Medicine

## 2019-04-14 ENCOUNTER — Encounter (HOSPITAL_COMMUNITY): Payer: Self-pay | Admitting: *Deleted

## 2019-04-14 ENCOUNTER — Other Ambulatory Visit: Payer: Self-pay

## 2019-04-14 ENCOUNTER — Emergency Department (HOSPITAL_COMMUNITY): Payer: Medicare Other

## 2019-04-14 DIAGNOSIS — E119 Type 2 diabetes mellitus without complications: Secondary | ICD-10-CM | POA: Insufficient documentation

## 2019-04-14 DIAGNOSIS — N1330 Unspecified hydronephrosis: Secondary | ICD-10-CM | POA: Insufficient documentation

## 2019-04-14 DIAGNOSIS — I10 Essential (primary) hypertension: Secondary | ICD-10-CM | POA: Diagnosis not present

## 2019-04-14 DIAGNOSIS — N10 Acute tubulo-interstitial nephritis: Secondary | ICD-10-CM | POA: Insufficient documentation

## 2019-04-14 DIAGNOSIS — Z7984 Long term (current) use of oral hypoglycemic drugs: Secondary | ICD-10-CM | POA: Diagnosis not present

## 2019-04-14 DIAGNOSIS — R110 Nausea: Secondary | ICD-10-CM | POA: Diagnosis not present

## 2019-04-14 DIAGNOSIS — R1031 Right lower quadrant pain: Secondary | ICD-10-CM | POA: Diagnosis present

## 2019-04-14 DIAGNOSIS — M5489 Other dorsalgia: Secondary | ICD-10-CM | POA: Diagnosis not present

## 2019-04-14 DIAGNOSIS — N202 Calculus of kidney with calculus of ureter: Secondary | ICD-10-CM | POA: Insufficient documentation

## 2019-04-14 DIAGNOSIS — R52 Pain, unspecified: Secondary | ICD-10-CM | POA: Diagnosis not present

## 2019-04-14 DIAGNOSIS — N134 Hydroureter: Secondary | ICD-10-CM | POA: Diagnosis not present

## 2019-04-14 DIAGNOSIS — N200 Calculus of kidney: Secondary | ICD-10-CM | POA: Diagnosis not present

## 2019-04-14 DIAGNOSIS — M545 Low back pain: Secondary | ICD-10-CM | POA: Diagnosis not present

## 2019-04-14 DIAGNOSIS — N132 Hydronephrosis with renal and ureteral calculous obstruction: Secondary | ICD-10-CM | POA: Diagnosis not present

## 2019-04-14 LAB — CBC WITH DIFFERENTIAL/PLATELET
Abs Immature Granulocytes: 0.03 10*3/uL (ref 0.00–0.07)
Basophils Absolute: 0 10*3/uL (ref 0.0–0.1)
Basophils Relative: 0 %
Eosinophils Absolute: 0 10*3/uL (ref 0.0–0.5)
Eosinophils Relative: 0 %
HCT: 38.4 % (ref 36.0–46.0)
Hemoglobin: 12.4 g/dL (ref 12.0–15.0)
Immature Granulocytes: 1 %
Lymphocytes Relative: 4 %
Lymphs Abs: 0.2 10*3/uL — ABNORMAL LOW (ref 0.7–4.0)
MCH: 33.6 pg (ref 26.0–34.0)
MCHC: 32.3 g/dL (ref 30.0–36.0)
MCV: 104.1 fL — ABNORMAL HIGH (ref 80.0–100.0)
Monocytes Absolute: 0 10*3/uL — ABNORMAL LOW (ref 0.1–1.0)
Monocytes Relative: 1 %
Neutro Abs: 4.7 10*3/uL (ref 1.7–7.7)
Neutrophils Relative %: 94 %
Platelets: 288 10*3/uL (ref 150–400)
RBC: 3.69 MIL/uL — ABNORMAL LOW (ref 3.87–5.11)
RDW: 17.8 % — ABNORMAL HIGH (ref 11.5–15.5)
WBC: 4.9 10*3/uL (ref 4.0–10.5)
nRBC: 1.2 % — ABNORMAL HIGH (ref 0.0–0.2)

## 2019-04-14 LAB — COMPREHENSIVE METABOLIC PANEL
ALT: 13 U/L (ref 0–44)
AST: 26 U/L (ref 15–41)
Albumin: 3.5 g/dL (ref 3.5–5.0)
Alkaline Phosphatase: 50 U/L (ref 38–126)
Anion gap: 11 (ref 5–15)
BUN: 20 mg/dL (ref 8–23)
CO2: 23 mmol/L (ref 22–32)
Calcium: 9.4 mg/dL (ref 8.9–10.3)
Chloride: 107 mmol/L (ref 98–111)
Creatinine, Ser: 1.23 mg/dL — ABNORMAL HIGH (ref 0.44–1.00)
GFR calc Af Amer: 48 mL/min — ABNORMAL LOW (ref >60)
GFR calc non Af Amer: 42 mL/min — ABNORMAL LOW (ref >60)
Glucose, Bld: 162 mg/dL — ABNORMAL HIGH (ref 70–99)
Potassium: 3.7 mmol/L (ref 3.5–5.1)
Sodium: 141 mmol/L (ref 135–145)
Total Bilirubin: 1.4 mg/dL — ABNORMAL HIGH (ref 0.3–1.2)
Total Protein: 5.9 g/dL — ABNORMAL LOW (ref 6.5–8.1)

## 2019-04-14 LAB — URINALYSIS, ROUTINE W REFLEX MICROSCOPIC
Bilirubin Urine: NEGATIVE
Glucose, UA: NEGATIVE mg/dL
Ketones, ur: NEGATIVE mg/dL
Nitrite: NEGATIVE
Protein, ur: 30 mg/dL — AB
Specific Gravity, Urine: 1.011 (ref 1.005–1.030)
WBC, UA: >50 WBC/hpf — ABNORMAL HIGH (ref 0–5)
pH: 8 (ref 5.0–8.0)

## 2019-04-14 MED ORDER — SODIUM CHLORIDE 0.9 % IV SOLN
1.0000 g | Freq: Once | INTRAVENOUS | Status: AC
  Administered 2019-04-14: 1 g via INTRAVENOUS
  Filled 2019-04-14: qty 10

## 2019-04-14 MED ORDER — MORPHINE SULFATE (PF) 4 MG/ML IV SOLN
4.0000 mg | Freq: Once | INTRAVENOUS | Status: AC
  Administered 2019-04-14: 4 mg via INTRAVENOUS
  Filled 2019-04-14: qty 1

## 2019-04-14 MED ORDER — KETOROLAC TROMETHAMINE 60 MG/2ML IM SOLN
30.0000 mg | Freq: Once | INTRAMUSCULAR | Status: AC
  Administered 2019-04-14: 30 mg via INTRAMUSCULAR
  Filled 2019-04-14: qty 2

## 2019-04-14 MED ORDER — LACTATED RINGERS IV BOLUS
1000.0000 mL | Freq: Once | INTRAVENOUS | Status: AC
  Administered 2019-04-14: 1000 mL via INTRAVENOUS

## 2019-04-14 MED ORDER — CEPHALEXIN 500 MG PO CAPS: 500.0000 mg | ORAL_CAPSULE | Freq: Two times a day (BID) | ORAL | 0 refills | Status: AC

## 2019-04-14 MED ORDER — ONDANSETRON HCL 4 MG/2ML IJ SOLN
4.0000 mg | Freq: Once | INTRAMUSCULAR | Status: AC
  Administered 2019-04-14: 4 mg via INTRAVENOUS
  Filled 2019-04-14: qty 2

## 2019-04-14 NOTE — Discharge Instructions (Addendum)
You were evaluated in the Emergency Department and after careful evaluation, we did not find any emergent condition requiring admission or further testing in the hospital.  Your symptoms today seem to be due to a kidney stone as well as a kidney infection.  There is a good chance that you will be able to pass this kidney stone on your own.  Is important that you use the antibiotics provided as directed and follow-up closely with your urologist.  As discussed, please return to the emergency department if you experience fever.  Please return to the Emergency Department if you experience any worsening of your condition.  We encourage you to follow up with a primary care provider.  Thank you for allowing [place] to be a part of your care.

## 2019-04-14 NOTE — ED Provider Notes (Signed)
Emergency Department Provider Note   I have reviewed the triage vital signs and the nursing notes.   HISTORY  Chief Complaint Back Pain   HPI Dorothy Anderson is a 78 y.o. female with multiple medical problems as documented below who presents to the emergency department today secondary to acute onset of right-sided back pain that radiates around to her right flank.  Patient states it started on 0 200s morning.  She states it feels like previous kidney stones but seems to be worse.  She had severe nausea since that time but no vomiting.  She not had any urinary symptoms or fever.  No GI symptoms otherwise.  Patient without a rash or recent trauma.  Has not tried any for symptoms presents here for further evaluation.   No other associated or modifying symptoms.    Past Medical History:  Diagnosis Date  . Arthritis KNEES   OA  . Calculus of left kidney   . Complication of anesthesia    slow to wake up  . Diabetes mellitus without complication ([place])    PRE DIABETIC ORAL MEDS   . Frequency of urination   . GERD (gastroesophageal reflux disease)   . Glaucoma BILATERAL EYES  . History of kidney stones   . Hydronephrosis, left    DR. STEVEN DAHLSTADT  . Hyperglycemia   . Hypertension   . Nocturia   . Wears glasses     Patient Active Problem List   Diagnosis Date Noted  . Ptosis of both eyelids 01/28/2015  . Diabetes type 2, controlled ([place]) 10/25/2014  . High blood pressure 10/25/2014  . Osteoarthritis of left knee 01/17/2013    Class: Diagnosis of  . Osteoarthrosis involving lower leg 01/17/2013    Past Surgical History:  Procedure Laterality Date  . CARPAL TUNNEL RELEASE  06-17-2005   RIGHT  . CATARACT EXTRACTION W/ INTRAOCULAR LENS  IMPLANT, BILATERAL    . CYSTO/ LEFT RETROGRADE PYELOGRAM/ LEFT URETERAL STENT PLACEMENT  04-15-2005   URETERAL STONE  . CYSTOSCOPY/RETROGRADE/URETEROSCOPY  01/27/2012   Procedure: CYSTOSCOPY/RETROGRADE/URETEROSCOPY;  Surgeon: Franchot Gallo, MD;  Location: [facility];  Service: Urology;  Laterality: Right;  . EXTRACORPOREAL SHOCK WAVE LITHOTRIPSY  11-09-11   RIGHT  . TONSILLECTOMY AND ADENOIDECTOMY  AGE 67  . TOTAL KNEE ARTHROPLASTY  09-29-2005   RIGHT  . TOTAL KNEE ARTHROPLASTY Left 01/17/2013   Procedure: TOTAL KNEE ARTHROPLASTY;  Surgeon: Meredith Pel, MD;  Location: [facility];  Service: Orthopedics;  Laterality: Left;  Left Total Knee Arthroplasty    Current Outpatient Rx  . Order #: [id number] Class: Historical Med  . Order #: [id number] Class: Historical Med  . Order #: [id number] Class: Historical Med  . Order #: [id number] Class: Historical Med  . Order #: [id number] Class: Historical Med  . Order #: [id number] Class: Historical Med  . Order #: [id number] Class: Historical Med  . Order #: [id number] Class: Historical Med  . Order #: [id number] Class: Historical Med  . Order #: [id number] Class: Historical Med  . Order #: [id number] Class: Historical Med  . Order #: [id number] Class: Historical Med  . Order #: [id number] Class: Historical Med  . Order #: [id number] Class: Historical Med  . Order #: [id number] Class: Historical Med  . Order #: [id number] Class: Historical Med  . Order #: [id number] Class: Historical Med  . Order #: [id number] Class: Historical Med    Allergies Amiloride; Atorvastatin calcium; Darvon; Macrobid [nitrofurantoin monohydrate macrocrystals]; Nitrofurantoin; and Propoxyphene  Family History  Problem Relation Age of Onset  . CVA Mother   . Heart attack  Father     Social History Social History   Tobacco Use  . Smoking status: Never Smoker  . Smokeless tobacco: Never Used  Substance Use Topics  . Alcohol use: No  . Drug use: No    Review of Systems  All other systems negative except as documented in the HPI. All pertinent positives and negatives as reviewed in the HPI. ____________________________________________   PHYSICAL EXAM:  VITAL SIGNS: ED Triage Vitals  Enc Vitals Group      BP 04/14/19 0508 (!) 157/58     Pulse Rate 04/14/19 0508 80     Resp 04/14/19 0508 20     Temp 04/14/19 0508 98 F (36.7 C)     Temp Source 04/14/19 0508 Oral     SpO2 04/14/19 0508 96 %     Weight 04/14/19 0509 165 lb (74.8 kg)     Height 04/14/19 0509 5' 3.25" (1.607 m)     Head Circumference --      Peak Flow --      Pain Score 04/14/19 0508 10     Pain Loc --      Pain Edu? --      Excl. in [place]? --     Constitutional: Alert and oriented. Well appearing and is writhing in pain.  Eyes: Conjunctivae are normal. PERRL. EOMI. Head: Atraumatic. Nose: No congestion/rhinnorhea. Mouth/Throat: Mucous membranes are moist.  Oropharynx non-erythematous. Neck: No stridor.  No meningeal signs.   Cardiovascular: Normal rate, regular rhythm. Good peripheral circulation. Grossly normal heart sounds.   Respiratory: Normal respiratory effort.  No retractions. Lungs CTAB. Gastrointestinal: Soft and nontender. No distention.  Musculoskeletal: No lower extremity tenderness nor edema. No gross deformities of extremities. Neurologic:  Normal speech and language. No gross focal neurologic deficits are appreciated.  Skin:  Skin is warm, dry and intact. No rash noted.   ____________________________________________   LABS (all labs ordered are listed, but only abnormal results are displayed)  Labs Reviewed  URINE CULTURE  CBC WITH DIFFERENTIAL/PLATELET  COMPREHENSIVE METABOLIC PANEL  URINALYSIS, ROUTINE W REFLEX MICROSCOPIC   ____________________________________________  EKG   EKG Interpretation  Date/Time:  Friday April 14 2019 05:02:29 EDT Ventricular Rate:  79 PR Interval:    QRS Duration: 96 QT Interval:  403 QTC Calculation: 462 R Axis:   25 Text Interpretation:  Sinus rhythm Borderline T abnormalities, anterior leads no obvious change from 11/2017 Confirmed by Merrily Pew [phone]) on 04/14/2019 6:24:36 AM       ____________________________________________  RADIOLOGY  No  results found.  ____________________________________________   PROCEDURES  Procedure(s) performed:   Procedures   ____________________________________________   INITIAL IMPRESSION / ASSESSMENT AND PLAN / ED COURSE  eval for kidney stone vs pyelonephritis.  Care transferred pending labs/ct and follow up for pain control. abx ordered for likely UTI, culture added.    Pertinent labs & imaging results that were available during my care of the patient were reviewed by me and considered in my medical decision making (see chart for details).  ____________________________________________  FINAL CLINICAL IMPRESSION(S) / ED DIAGNOSES  Final diagnoses:  Acute pyelonephritis  Kidney stone     MEDICATIONS GIVEN DURING THIS VISIT:  Medications  ketorolac (TORADOL) injection 30 mg (has no administration in time range)  morphine 4 MG/ML injection 4 mg (has no administration in time range)  ondansetron (ZOFRAN) injection 4 mg (has no administration in time range)  lactated ringers bolus 1,000 mL (has no administration in time range)  NEW OUTPATIENT MEDICATIONS STARTED DURING THIS VISIT:  New Prescriptions   No medications on file    Note:  This note was prepared with assistance of Dragon voice recognition software. Occasional wrong-word or sound-a-like substitutions may have occurred due to the inherent limitations of voice recognition software.   Merrily Pew, MD 04/14/19 [phone]

## 2019-04-14 NOTE — ED Provider Notes (Signed)
Assumed care from Dr. Dayna Barker at shift change.  CT confirms ureteral stone, 4 mm.  Given the urinalysis showing some signs of infection, urology was consulted.  Patient is not exhibiting any signs of sepsis, no fever, no white count, patient explains that she is feeling much better.  Patient wishes to be discharged.  Wants to follow-up with her own urologist.  Dr. Tammi Klippel of urology agrees with this discharge plan so long as there is strict return precautions for fever, home antibiotics.  Given her creatinine clearance, provided with Keflex twice daily.  After the discussed management above, the patient was determined to be safe for discharge.  The patient was in agreement with this plan and all questions regarding their care were answered.  ED return precautions were discussed and the patient will return to the ED with any significant worsening of condition.   Maudie Flakes, MD 04/14/19 1041

## 2019-04-14 NOTE — ED Triage Notes (Signed)
States she was awaken by right flank pain at 2am states the pain radiates to right lower abd. States she has a history of kidney stones went to bathroom and urine was dark in color. States she took a Vicodin at 3 am no relief.

## 2019-04-17 LAB — URINE CULTURE: Culture: >=100000 — AB

## 2019-04-18 ENCOUNTER — Telehealth: Payer: Self-pay

## 2019-04-18 NOTE — Telephone Encounter (Signed)
Post ED Visit - Positive Culture Follow-up: Unsuccessful Patient Follow-up  Culture assessed and recommendations reviewed by:  []  Elenor Quinones, Pharm.D. []  Heide Guile, Pharm.D., BCPS AQ-ID []  Parks Neptune, Pharm.D., BCPS []  Alycia Rossetti, Pharm.D., BCPS []  [place], Pharm.D., BCPS, AAHIVP []  Legrand Como, Pharm.D., BCPS, AAHIVP []  Wynell Balloon, PharmD []  Vincenza Hews, PharmD, BCPS [facility] Positive urne culture  []  Patient discharged without antimicrobial prescription and treatment is now indicated [x]  Organism is resistant to prescribed ED discharge antimicrobial []  Patient with positive blood cultures   Unable to contact patient after 3 attempts, letter will be sent to address on file  Genia Del 04/18/2019, 9:47 AM

## 2019-04-18 NOTE — Progress Notes (Signed)
ED Antimicrobial Stewardship Positive Culture Follow Up   Dorothy Anderson is an 78 y.o. female who presented to [facility] on 04/14/2019 with a chief complaint of  Chief Complaint  Patient presents with  . Back Pain   and found to have a ureteral stone.   Recent Results (from the past 720 hour(s))  Urine Culture     Status: Abnormal   Collection Time: 04/14/19  5:38 AM  Result Value Ref Range Status   Specimen Description IN/OUT CATH URINE  Final   Special Requests   Final    NONE Performed at [facility], [address] [place], [place] [address]    Culture (A)  Final    >=100,000 COLONIES/mL KLEBSIELLA PNEUMONIAE >=100,000 COLONIES/mL MORGANELLA MORGANII    Report Status 04/17/2019 FINAL  Final   Organism ID, Bacteria KLEBSIELLA PNEUMONIAE (A)  Final   Organism ID, Bacteria MORGANELLA MORGANII (A)  Final      Susceptibility   Klebsiella pneumoniae - MIC*    AMPICILLIN >=32 RESISTANT Resistant     CEFAZOLIN <=4 SENSITIVE Sensitive     CEFTRIAXONE <=1 SENSITIVE Sensitive     CIPROFLOXACIN <=0.25 SENSITIVE Sensitive     GENTAMICIN <=1 SENSITIVE Sensitive     IMIPENEM <=0.25 SENSITIVE Sensitive     NITROFURANTOIN <=16 SENSITIVE Sensitive     TRIMETH/SULFA <=20 SENSITIVE Sensitive     AMPICILLIN/SULBACTAM >=32 RESISTANT Resistant     PIP/TAZO 32 INTERMEDIATE Intermediate     Extended ESBL NEGATIVE Sensitive     * >=100,000 COLONIES/mL KLEBSIELLA PNEUMONIAE   Morganella morganii - MIC*    AMPICILLIN >=32 RESISTANT Resistant     CEFAZOLIN >=64 RESISTANT Resistant     CEFTRIAXONE <=1 SENSITIVE Sensitive     CIPROFLOXACIN <=0.25 SENSITIVE Sensitive     GENTAMICIN <=1 SENSITIVE Sensitive     IMIPENEM 1 SENSITIVE Sensitive     NITROFURANTOIN 128 RESISTANT Resistant     TRIMETH/SULFA >=320 RESISTANT Resistant     AMPICILLIN/SULBACTAM 16 INTERMEDIATE Intermediate     PIP/TAZO <=4 SENSITIVE Sensitive     * >=100,000 COLONIES/mL MORGANELLA MORGANII    [x]  Treated with  cephalexin, organism resistant to prescribed antimicrobial []  Patient discharged originally without antimicrobial agent and treatment is now indicated  New antibiotic prescription: ciprofloxacin 500mg  q12 x 7 days   ED Provider: Alyse Low PA-C     K  04/18/2019, 9:04 AM Clinical Pharmacist Monday - Friday phone -  [phone] Saturday - Sunday phone - [phone]

## 2019-04-21 ENCOUNTER — Ambulatory Visit: Payer: Medicare Other | Admitting: Podiatry

## 2019-04-21 ENCOUNTER — Telehealth: Payer: Self-pay | Admitting: *Deleted

## 2019-04-21 NOTE — Telephone Encounter (Signed)
Received contact from patient in response to letter sent to address on file.  Ciprofloxacin 500mg  q12 hours  X 7 days call to [employer] [phone]

## 2019-04-24 DIAGNOSIS — N201 Calculus of ureter: Secondary | ICD-10-CM | POA: Diagnosis not present

## 2019-05-03 DIAGNOSIS — Z789 Other specified health status: Secondary | ICD-10-CM | POA: Diagnosis not present

## 2019-05-03 DIAGNOSIS — E119 Type 2 diabetes mellitus without complications: Secondary | ICD-10-CM | POA: Diagnosis not present

## 2019-05-03 DIAGNOSIS — Z5181 Encounter for therapeutic drug level monitoring: Secondary | ICD-10-CM | POA: Diagnosis not present

## 2019-05-16 ENCOUNTER — Encounter: Payer: Self-pay | Admitting: Podiatry

## 2019-05-16 ENCOUNTER — Other Ambulatory Visit: Payer: Self-pay

## 2019-05-16 ENCOUNTER — Ambulatory Visit (INDEPENDENT_AMBULATORY_CARE_PROVIDER_SITE_OTHER): Payer: Medicare Other | Admitting: Podiatry

## 2019-05-16 DIAGNOSIS — M79675 Pain in left toe(s): Secondary | ICD-10-CM | POA: Diagnosis not present

## 2019-05-16 DIAGNOSIS — B351 Tinea unguium: Secondary | ICD-10-CM | POA: Diagnosis not present

## 2019-05-16 DIAGNOSIS — M79674 Pain in right toe(s): Secondary | ICD-10-CM | POA: Diagnosis not present

## 2019-05-16 NOTE — Patient Instructions (Signed)
Diabetes Mellitus and Foot Care Foot care is an important part of your health, especially when you have diabetes. Diabetes may cause you to have problems because of poor blood flow (circulation) to your feet and legs, which can cause your skin to:  Become thinner and drier.  Break more easily.  Heal more slowly.  Peel and crack. You may also have nerve damage (neuropathy) in your legs and feet, causing decreased feeling in them. This means that you may not notice minor injuries to your feet that could lead to more serious problems. Noticing and addressing any potential problems early is the best way to prevent future foot problems. How to care for your feet Foot hygiene  Wash your feet daily with warm water and mild soap. Do not use hot water. Then, pat your feet and the areas between your toes until they are completely dry. Do not soak your feet as this can dry your skin.  Trim your toenails straight across. Do not dig under them or around the cuticle. File the edges of your nails with an emery board or nail file.  Apply a moisturizing lotion or petroleum jelly to the skin on your feet and to dry, brittle toenails. Use lotion that does not contain alcohol and is unscented. Do not apply lotion between your toes. Shoes and socks  Wear clean socks or stockings every day. Make sure they are not too tight. Do not wear knee-high stockings since they may decrease blood flow to your legs.  Wear shoes that fit properly and have enough cushioning. Always look in your shoes before you put them on to be sure there are no objects inside.  To break in new shoes, wear them for just a few hours a day. This prevents injuries on your feet. Wounds, scrapes, corns, and calluses  Check your feet daily for blisters, cuts, bruises, sores, and redness. If you cannot see the bottom of your feet, use a mirror or ask someone for help.  Do not cut corns or calluses or try to remove them with medicine.  If you  find a minor scrape, cut, or break in the skin on your feet, keep it and the skin around it clean and dry. You may clean these areas with mild soap and water. Do not clean the area with peroxide, alcohol, or iodine.  If you have a wound, scrape, corn, or callus on your foot, look at it several times a day to make sure it is healing and not infected. Check for: ? Redness, swelling, or pain. ? Fluid or blood. ? Warmth. ? Pus or a bad smell. General instructions  Do not cross your legs. This may decrease blood flow to your feet.  Do not use heating pads or hot water bottles on your feet. They may burn your skin. If you have lost feeling in your feet or legs, you may not know this is happening until it is too late.  Protect your feet from hot and cold by wearing shoes, such as at the beach or on hot pavement.  Schedule a complete foot exam at least once a year (annually) or more often if you have foot problems. If you have foot problems, report any cuts, sores, or bruises to your health care provider immediately. Contact a health care provider if:  You have a medical condition that increases your risk of infection and you have any cuts, sores, or bruises on your feet.  You have an injury that is not   healing.  You have redness on your legs or feet.  You feel burning or tingling in your legs or feet.  You have pain or cramps in your legs and feet.  Your legs or feet are numb.  Your feet always feel cold.  You have pain around a toenail. Get help right away if:  You have a wound, scrape, corn, or callus on your foot and: ? You have pain, swelling, or redness that gets worse. ? You have fluid or blood coming from the wound, scrape, corn, or callus. ? Your wound, scrape, corn, or callus feels warm to the touch. ? You have pus or a bad smell coming from the wound, scrape, corn, or callus. ? You have a fever. ? You have a red line going up your leg. Summary  Check your feet every day  for cuts, sores, red spots, swelling, and blisters.  Moisturize feet and legs daily.  Wear shoes that fit properly and have enough cushioning.  If you have foot problems, report any cuts, sores, or bruises to your health care provider immediately.  Schedule a complete foot exam at least once a year (annually) or more often if you have foot problems. This information is not intended to replace advice given to you by your health care provider. Make sure you discuss any questions you have with your health care provider. Document Released: 10/23/2000 Document Revised: 12/08/2017 Document Reviewed: 11/27/2016 Elsevier Patient Education  2020 Elsevier Inc.  

## 2019-05-18 NOTE — Progress Notes (Signed)
Subjective: Dorothy Anderson presents today with painful, thick toenails 1-5 b/l that she cannot cut and which interfere with daily activities.  Pain is aggravated when wearing enclosed shoe gear.   Current Outpatient Medications:  .  aspirin EC 81 MG tablet, Take 81 mg by mouth at bedtime. , Disp: , Rfl:  .  brimonidine (ALPHAGAN) 0.2 % ophthalmic solution, Place 1 drop into both eyes 2 (two) times a day. , Disp: , Rfl:  .  CALCIUM-VITAMIN D PO, Take 1 tablet by mouth daily., Disp: , Rfl:  .  cephALEXin (KEFLEX) 250 MG capsule, TK 1 C PO Q 8 H, Disp: , Rfl:  .  cholecalciferol (VITAMIN D) 1000 UNITS tablet, Take 1,000 Units by mouth daily. , Disp: , Rfl:  .  ciprofloxacin (CIPRO) 500 MG tablet, TK 1 T PO Q 12 H FOR 7 DAYS, Disp: , Rfl:  .  docusate sodium (COLACE) 100 MG capsule, Take 100 mg by mouth 2 (two) times daily. , Disp: , Rfl:  .  DULoxetine (CYMBALTA) 30 MG capsule, Take 30 mg by mouth daily. , Disp: , Rfl:  .  HYDROcodone-acetaminophen (NORCO/VICODIN) 5-325 MG tablet, Take 1 tablet by mouth See admin instructions. Take twice daily. Can take a third as needed for pain, Disp: , Rfl:  .  Lancets (ONETOUCH DELICA PLUS [id number]) MISC, U 1 LANCET ONCE A DAY, Disp: , Rfl:  .  lansoprazole (PREVACID) 15 MG capsule, Take 15 mg by mouth 2 (two) times daily. , Disp: , Rfl:  .  latanoprost (XALATAN) 0.005 % ophthalmic solution, Place 1 drop into both eyes at bedtime. , Disp: , Rfl:  .  metFORMIN (GLUCOPHAGE-XR) 500 MG 24 hr tablet, Take 1,000 mg by mouth daily with breakfast. , Disp: , Rfl:  .  metoprolol tartrate (LOPRESSOR) 25 MG tablet, Take 25 mg by mouth 2 (two) times daily. , Disp: , Rfl:  .  Multiple Vitamins-Minerals (MULTIVITAMINS THER. W/MINERALS) TABS, Take 1 tablet by mouth daily. , Disp: , Rfl:  .  ONETOUCH VERIO test strip, U ONCE A DAY, Disp: , Rfl:  .  oxybutynin (DITROPAN) 5 MG tablet, , Disp: , Rfl:  .  oxybutynin (DITROPAN-XL) 5 MG 24 hr tablet, , Disp: , Rfl:  .  SHINGRIX  injection, , Disp: , Rfl:   Allergies  Allergen Reactions  . Amiloride Other (See Comments)    High blood level of potassium  . Atorvastatin Calcium Other (See Comments)    Muscle weakness and pain  . Darvon Other (See Comments)    MADE PT STAY AWAKE   . Macrobid [Nitrofurantoin Monohydrate Macrocrystals] Nausea And Vomiting  . Nitrofurantoin Nausea And Vomiting  . Propoxyphene Other (See Comments)    MADE PT STAY AWAKE    Objective: Vascular Examination: Capillary refill time immediate x 10 digits  Dorsalis pedis 1/4 b/l.  Posterior tibial pulses 1/4 b/l.  Digital hair absent x 10 digits.  Skin temperature gradient WNL b/l.  Dermatological Examination: Skin with normal turgor, texture and tone b/l.  Toenails 1-5 b/l discolored, thick, dystrophic with subungual debris and pain with palpation to nailbeds due to thickness of nails.  Musculoskeletal: Muscle strength 5/5 to all LE muscle groups  Overlapping 2nd digit  No pain, crepitus or joint limitation noted with ROM.   Neurological: Sensation intact 5/5 b/l with 10 gram monofilament.  Diminished sharp/dull sensation b/l.  Assessment: Painful onychomycosis toenails 1-5 b/l  Overlapping 2nd toe left foot NIDDM  Plan: 1. Toenails 1-5 b/l  were debrided in length and girth without iatrogenic bleeding. 2. Patient to continue soft, supportive shoe gear daily. 3. Patient to report any pedal injuries to medical professional immediately. 4. Follow up 3 months.  5. Patient/POA to call should there be a concern in the interim.

## 2019-05-30 DIAGNOSIS — R8271 Bacteriuria: Secondary | ICD-10-CM | POA: Diagnosis not present

## 2019-05-30 DIAGNOSIS — K7689 Other specified diseases of liver: Secondary | ICD-10-CM | POA: Diagnosis not present

## 2019-05-30 DIAGNOSIS — N8110 Cystocele, unspecified: Secondary | ICD-10-CM | POA: Diagnosis not present

## 2019-05-30 DIAGNOSIS — N201 Calculus of ureter: Secondary | ICD-10-CM | POA: Diagnosis not present

## 2019-06-05 DIAGNOSIS — N210 Calculus in bladder: Secondary | ICD-10-CM | POA: Diagnosis not present

## 2019-06-05 DIAGNOSIS — N201 Calculus of ureter: Secondary | ICD-10-CM | POA: Diagnosis not present

## 2019-07-27 DIAGNOSIS — Z6829 Body mass index (BMI) 29.0-29.9, adult: Secondary | ICD-10-CM | POA: Diagnosis not present

## 2019-07-27 DIAGNOSIS — M5136 Other intervertebral disc degeneration, lumbar region: Secondary | ICD-10-CM | POA: Diagnosis not present

## 2019-07-27 DIAGNOSIS — N182 Chronic kidney disease, stage 2 (mild): Secondary | ICD-10-CM | POA: Diagnosis not present

## 2019-07-27 DIAGNOSIS — E663 Overweight: Secondary | ICD-10-CM | POA: Diagnosis not present

## 2019-07-27 DIAGNOSIS — R29898 Other symptoms and signs involving the musculoskeletal system: Secondary | ICD-10-CM | POA: Diagnosis not present

## 2019-07-27 DIAGNOSIS — M150 Primary generalized (osteo)arthritis: Secondary | ICD-10-CM | POA: Diagnosis not present

## 2019-07-27 DIAGNOSIS — M8580 Other specified disorders of bone density and structure, unspecified site: Secondary | ICD-10-CM | POA: Diagnosis not present

## 2019-08-09 DIAGNOSIS — E559 Vitamin D deficiency, unspecified: Secondary | ICD-10-CM | POA: Diagnosis not present

## 2019-08-09 DIAGNOSIS — E875 Hyperkalemia: Secondary | ICD-10-CM | POA: Diagnosis not present

## 2019-08-09 DIAGNOSIS — Z87442 Personal history of urinary calculi: Secondary | ICD-10-CM | POA: Diagnosis not present

## 2019-08-09 DIAGNOSIS — N183 Chronic kidney disease, stage 3 (moderate): Secondary | ICD-10-CM | POA: Diagnosis not present

## 2019-08-09 DIAGNOSIS — I129 Hypertensive chronic kidney disease with stage 1 through stage 4 chronic kidney disease, or unspecified chronic kidney disease: Secondary | ICD-10-CM | POA: Diagnosis not present

## 2019-08-14 DIAGNOSIS — Z23 Encounter for immunization: Secondary | ICD-10-CM | POA: Diagnosis not present

## 2019-08-21 DIAGNOSIS — N201 Calculus of ureter: Secondary | ICD-10-CM | POA: Diagnosis not present

## 2019-08-21 DIAGNOSIS — R825 Elevated urine levels of drugs, medicaments and biological substances: Secondary | ICD-10-CM | POA: Diagnosis not present

## 2019-08-21 DIAGNOSIS — N3281 Overactive bladder: Secondary | ICD-10-CM | POA: Diagnosis not present

## 2019-08-28 DIAGNOSIS — H401131 Primary open-angle glaucoma, bilateral, mild stage: Secondary | ICD-10-CM | POA: Diagnosis not present

## 2019-08-28 DIAGNOSIS — H04123 Dry eye syndrome of bilateral lacrimal glands: Secondary | ICD-10-CM | POA: Diagnosis not present

## 2019-08-28 DIAGNOSIS — Z961 Presence of intraocular lens: Secondary | ICD-10-CM | POA: Diagnosis not present

## 2019-08-28 DIAGNOSIS — H35373 Puckering of macula, bilateral: Secondary | ICD-10-CM | POA: Diagnosis not present

## 2019-09-20 DIAGNOSIS — N201 Calculus of ureter: Secondary | ICD-10-CM | POA: Diagnosis not present

## 2019-09-20 DIAGNOSIS — R3915 Urgency of urination: Secondary | ICD-10-CM | POA: Diagnosis not present

## 2019-09-20 DIAGNOSIS — N3000 Acute cystitis without hematuria: Secondary | ICD-10-CM | POA: Diagnosis not present

## 2019-10-23 DIAGNOSIS — R29898 Other symptoms and signs involving the musculoskeletal system: Secondary | ICD-10-CM | POA: Diagnosis not present

## 2019-10-23 DIAGNOSIS — Z6829 Body mass index (BMI) 29.0-29.9, adult: Secondary | ICD-10-CM | POA: Diagnosis not present

## 2019-10-23 DIAGNOSIS — M150 Primary generalized (osteo)arthritis: Secondary | ICD-10-CM | POA: Diagnosis not present

## 2019-10-23 DIAGNOSIS — M8580 Other specified disorders of bone density and structure, unspecified site: Secondary | ICD-10-CM | POA: Diagnosis not present

## 2019-10-23 DIAGNOSIS — N182 Chronic kidney disease, stage 2 (mild): Secondary | ICD-10-CM | POA: Diagnosis not present

## 2019-10-23 DIAGNOSIS — M79602 Pain in left arm: Secondary | ICD-10-CM | POA: Diagnosis not present

## 2019-10-23 DIAGNOSIS — E663 Overweight: Secondary | ICD-10-CM | POA: Diagnosis not present

## 2019-10-23 DIAGNOSIS — M5136 Other intervertebral disc degeneration, lumbar region: Secondary | ICD-10-CM | POA: Diagnosis not present

## 2019-10-26 DIAGNOSIS — Z5181 Encounter for therapeutic drug level monitoring: Secondary | ICD-10-CM | POA: Diagnosis not present

## 2019-10-26 DIAGNOSIS — Z87442 Personal history of urinary calculi: Secondary | ICD-10-CM | POA: Diagnosis not present

## 2019-10-26 DIAGNOSIS — R8290 Unspecified abnormal findings in urine: Secondary | ICD-10-CM | POA: Diagnosis not present

## 2019-10-26 DIAGNOSIS — E119 Type 2 diabetes mellitus without complications: Secondary | ICD-10-CM | POA: Diagnosis not present

## 2019-11-15 DIAGNOSIS — Z789 Other specified health status: Secondary | ICD-10-CM | POA: Diagnosis not present

## 2019-11-15 DIAGNOSIS — Z87442 Personal history of urinary calculi: Secondary | ICD-10-CM | POA: Diagnosis not present

## 2019-11-15 DIAGNOSIS — Z1231 Encounter for screening mammogram for malignant neoplasm of breast: Secondary | ICD-10-CM | POA: Diagnosis not present

## 2019-11-15 DIAGNOSIS — E1169 Type 2 diabetes mellitus with other specified complication: Secondary | ICD-10-CM | POA: Diagnosis not present

## 2019-11-15 DIAGNOSIS — Z0000 Encounter for general adult medical examination without abnormal findings: Secondary | ICD-10-CM | POA: Diagnosis not present

## 2019-11-15 DIAGNOSIS — Z1389 Encounter for screening for other disorder: Secondary | ICD-10-CM | POA: Diagnosis not present

## 2019-11-15 DIAGNOSIS — I10 Essential (primary) hypertension: Secondary | ICD-10-CM | POA: Diagnosis not present

## 2019-11-15 DIAGNOSIS — K219 Gastro-esophageal reflux disease without esophagitis: Secondary | ICD-10-CM | POA: Diagnosis not present

## 2019-11-15 DIAGNOSIS — Z7984 Long term (current) use of oral hypoglycemic drugs: Secondary | ICD-10-CM | POA: Diagnosis not present

## 2019-11-17 ENCOUNTER — Ambulatory Visit: Payer: Medicare Other | Admitting: Podiatry

## 2019-12-13 DIAGNOSIS — N3000 Acute cystitis without hematuria: Secondary | ICD-10-CM | POA: Diagnosis not present

## 2019-12-13 DIAGNOSIS — N201 Calculus of ureter: Secondary | ICD-10-CM | POA: Diagnosis not present

## 2020-01-01 DIAGNOSIS — H401131 Primary open-angle glaucoma, bilateral, mild stage: Secondary | ICD-10-CM | POA: Diagnosis not present

## 2020-01-01 DIAGNOSIS — H04123 Dry eye syndrome of bilateral lacrimal glands: Secondary | ICD-10-CM | POA: Diagnosis not present

## 2020-01-01 DIAGNOSIS — Z961 Presence of intraocular lens: Secondary | ICD-10-CM | POA: Diagnosis not present

## 2020-01-01 DIAGNOSIS — H35373 Puckering of macula, bilateral: Secondary | ICD-10-CM | POA: Diagnosis not present

## 2020-01-22 DIAGNOSIS — M5136 Other intervertebral disc degeneration, lumbar region: Secondary | ICD-10-CM | POA: Diagnosis not present

## 2020-01-22 DIAGNOSIS — N182 Chronic kidney disease, stage 2 (mild): Secondary | ICD-10-CM | POA: Diagnosis not present

## 2020-01-22 DIAGNOSIS — M8580 Other specified disorders of bone density and structure, unspecified site: Secondary | ICD-10-CM | POA: Diagnosis not present

## 2020-01-22 DIAGNOSIS — R29898 Other symptoms and signs involving the musculoskeletal system: Secondary | ICD-10-CM | POA: Diagnosis not present

## 2020-01-22 DIAGNOSIS — E663 Overweight: Secondary | ICD-10-CM | POA: Diagnosis not present

## 2020-01-22 DIAGNOSIS — M150 Primary generalized (osteo)arthritis: Secondary | ICD-10-CM | POA: Diagnosis not present

## 2020-01-22 DIAGNOSIS — Z6829 Body mass index (BMI) 29.0-29.9, adult: Secondary | ICD-10-CM | POA: Diagnosis not present

## 2020-04-09 ENCOUNTER — Other Ambulatory Visit: Payer: Self-pay

## 2020-04-09 ENCOUNTER — Ambulatory Visit (INDEPENDENT_AMBULATORY_CARE_PROVIDER_SITE_OTHER): Payer: Medicare Other | Admitting: Podiatry

## 2020-04-09 DIAGNOSIS — M79675 Pain in left toe(s): Secondary | ICD-10-CM

## 2020-04-09 DIAGNOSIS — B351 Tinea unguium: Secondary | ICD-10-CM

## 2020-04-09 DIAGNOSIS — M79674 Pain in right toe(s): Secondary | ICD-10-CM

## 2020-04-09 DIAGNOSIS — M2042 Other hammer toe(s) (acquired), left foot: Secondary | ICD-10-CM

## 2020-04-09 DIAGNOSIS — M2041 Other hammer toe(s) (acquired), right foot: Secondary | ICD-10-CM | POA: Diagnosis not present

## 2020-04-09 DIAGNOSIS — Z1231 Encounter for screening mammogram for malignant neoplasm of breast: Secondary | ICD-10-CM | POA: Diagnosis not present

## 2020-04-09 DIAGNOSIS — E119 Type 2 diabetes mellitus without complications: Secondary | ICD-10-CM

## 2020-04-09 DIAGNOSIS — Z124 Encounter for screening for malignant neoplasm of cervix: Secondary | ICD-10-CM | POA: Diagnosis not present

## 2020-04-09 DIAGNOSIS — Z01419 Encounter for gynecological examination (general) (routine) without abnormal findings: Secondary | ICD-10-CM | POA: Diagnosis not present

## 2020-04-09 NOTE — Patient Instructions (Addendum)
Diabetes Mellitus and Foot Care Foot care is an important part of your health, especially when you have diabetes. Diabetes may cause you to have problems because of poor blood flow (circulation) to your feet and legs, which can cause your skin to:  Become thinner and drier.  Break more easily.  Heal more slowly.  Peel and crack. You may also have nerve damage (neuropathy) in your legs and feet, causing decreased feeling in them. This means that you may not notice minor injuries to your feet that could lead to more serious problems. Noticing and addressing any potential problems early is the best way to prevent future foot problems. How to care for your feet Foot hygiene  Wash your feet daily with warm water and mild soap. Do not use hot water. Then, pat your feet and the areas between your toes until they are completely dry. Do not soak your feet as this can dry your skin.  Trim your toenails straight across. Do not dig under them or around the cuticle. File the edges of your nails with an emery board or nail file.  Apply a moisturizing lotion or petroleum jelly to the skin on your feet and to dry, brittle toenails. Use lotion that does not contain alcohol and is unscented. Do not apply lotion between your toes. Shoes and socks  Wear clean socks or stockings every day. Make sure they are not too tight. Do not wear knee-high stockings since they may decrease blood flow to your legs.  Wear shoes that fit properly and have enough cushioning. Always look in your shoes before you put them on to be sure there are no objects inside.  To break in new shoes, wear them for just a few hours a day. This prevents injuries on your feet. Wounds, scrapes, corns, and calluses  Check your feet daily for blisters, cuts, bruises, sores, and redness. If you cannot see the bottom of your feet, use a mirror or ask someone for help.  Do not cut corns or calluses or try to remove them with medicine.  If you  find a minor scrape, cut, or break in the skin on your feet, keep it and the skin around it clean and dry. You may clean these areas with mild soap and water. Do not clean the area with peroxide, alcohol, or iodine.  If you have a wound, scrape, corn, or callus on your foot, look at it several times a day to make sure it is healing and not infected. Check for: ? Redness, swelling, or pain. ? Fluid or blood. ? Warmth. ? Pus or a bad smell. General instructions  Do not cross your legs. This may decrease blood flow to your feet.  Do not use heating pads or hot water bottles on your feet. They may burn your skin. If you have lost feeling in your feet or legs, you may not know this is happening until it is too late.  Protect your feet from hot and cold by wearing shoes, such as at the beach or on hot pavement.  Schedule a complete foot exam at least once a year (annually) or more often if you have foot problems. If you have foot problems, report any cuts, sores, or bruises to your health care provider immediately. Contact a health care provider if:  You have a medical condition that increases your risk of infection and you have any cuts, sores, or bruises on your feet.  You have an injury that is not   healing.  You have redness on your legs or feet.  You feel burning or tingling in your legs or feet.  You have pain or cramps in your legs and feet.  Your legs or feet are numb.  Your feet always feel cold.  You have pain around a toenail. Get help right away if:  You have a wound, scrape, corn, or callus on your foot and: ? You have pain, swelling, or redness that gets worse. ? You have fluid or blood coming from the wound, scrape, corn, or callus. ? Your wound, scrape, corn, or callus feels warm to the touch. ? You have pus or a bad smell coming from the wound, scrape, corn, or callus. ? You have a fever. ? You have a red line going up your leg. Summary  Check your feet every day  for cuts, sores, red spots, swelling, and blisters.  Moisturize feet and legs daily.  Wear shoes that fit properly and have enough cushioning.  If you have foot problems, report any cuts, sores, or bruises to your health care provider immediately.  Schedule a complete foot exam at least once a year (annually) or more often if you have foot problems. This information is not intended to replace advice given to you by your health care provider. Make sure you discuss any questions you have with your health care provider. Document Revised: 07/19/2019 Document Reviewed: 11/27/2016 Elsevier Patient Education  2020 Elsevier Inc.   Hammer Toe  Hammer toe is a change in the shape (a deformity) of your toe. The deformity causes the middle joint of your toe to stay bent. This causes pain, especially when you are wearing shoes. Hammer toe starts gradually. At first, the toe can be straightened. Gradually over time, the deformity becomes stiff and permanent. Early treatments to keep the toe straight may relieve pain. As the deformity becomes stiff and permanent, surgery may be needed to straighten the toe. What are the causes? Hammer toe is caused by abnormal bending of the toe joint that is closest to your foot. It happens gradually over time. This pulls on the muscles and connections (tendons) of the toe joint, making them weak and stiff. It is often related to wearing shoes that are too short or narrow and do not let your toes straighten. What increases the risk? You may be at greater risk for hammer toe if you:  Are female.  Are older.  Wear shoes that are too small.  Wear high-heeled shoes that pinch your toes.  Are a ballet dancer.  Have a second toe that is longer than your big toe (first toe).  Injure your foot or toe.  Have arthritis.  Have a family history of hammer toe.  Have a nerve or muscle disorder. What are the signs or symptoms? The main symptoms of this condition are  pain and deformity of the toe. The pain is worse when wearing shoes, walking, or running. Other symptoms may include:  Corns or calluses over the bent part of the toe or between the toes.  Redness and a burning feeling on the toe.  An open sore that forms on the top of the toe.  Not being able to straighten the toe. How is this diagnosed? This condition is diagnosed based on your symptoms and a physical exam. During the exam, your health care provider will try to straighten your toe to see how stiff the deformity is. You may also have tests, such as:  A blood test   to check for rheumatoid arthritis.  An X-ray to show how severe the deformity is. How is this treated? Treatment for this condition will depend on how stiff the deformity is. Surgery is often needed. However, sometimes a hammer toe can be straightened without surgery. Treatments that do not involve surgery include:  Taping the toe into a straightened position.  Using pads and cushions to protect the toe (orthotics).  Wearing shoes that provide enough room for the toes.  Doing toe-stretching exercises at home.  Taking an NSAID to reduce pain and swelling. If these treatments do not help or the toe cannot be straightened, surgery is the next option. The most common surgeries used to straighten a hammer toe include:  Arthroplasty. In this procedure, part of the joint is removed, and that allows the toe to straighten.  Fusion. In this procedure, cartilage between the two bones of the joint is taken out and the bones are fused together into one longer bone.  Implantation. In this procedure, part of the bone is removed and replaced with an implant to let the toe move again.  Flexor tendon transfer. In this procedure, the tendons that curl the toes down (flexor tendons) are repositioned. Follow these instructions at home:  Take over-the-counter and prescription medicines only as told by your health care provider.  Do toe  straightening and stretching exercises as told by your health care provider.  Keep all follow-up visits as told by your health care provider. This is important. How is this prevented?  Wear shoes that give your toes enough room and do not cause pain.  Do not wear high-heeled shoes. Contact a health care provider if:  Your pain gets worse.  Your toe becomes red or swollen.  You develop an open sore on your toe. This information is not intended to replace advice given to you by your health care provider. Make sure you discuss any questions you have with your health care provider. Document Revised: 10/08/2017 Document Reviewed: 02/19/2016 Elsevier Patient Education  2020 Elsevier Inc.  Onychomycosis/Fungal Toenails  WHAT IS IT? An infection that lies within the keratin of your nail plate that is caused by a fungus.  WHY ME? Fungal infections affect all ages, sexes, races, and creeds.  There may be many factors that predispose you to a fungal infection such as age, coexisting medical conditions such as diabetes, or an autoimmune disease; stress, medications, fatigue, genetics, etc.  Bottom line: fungus thrives in a warm, moist environment and your shoes offer such a location.  IS IT CONTAGIOUS? Theoretically, yes.  You do not want to share shoes, nail clippers or files with someone who has fungal toenails.  Walking around barefoot in the same room or sleeping in the same bed is unlikely to transfer the organism.  It is important to realize, however, that fungus can spread easily from one nail to the next on the same foot.  HOW DO WE TREAT THIS?  There are several ways to treat this condition.  Treatment may depend on many factors such as age, medications, pregnancy, liver and kidney conditions, etc.  It is best to ask your doctor which options are available to you.  5. No treatment.   Unlike many other medical concerns, you can live with this condition.  However for many people this can be  a painful condition and may lead to ingrown toenails or a bacterial infection.  It is recommended that you keep the nails cut short to help reduce the amount   of fungal nail. 6. Topical treatment.  These range from herbal remedies to prescription strength nail lacquers.  About 40-50% effective, topicals require twice daily application for approximately 9 to 12 months or until an entirely new nail has grown out.  The most effective topicals are medical grade medications available through physicians offices. 7. Oral antifungal medications.  With an 80-90% cure rate, the most common oral medication requires 3 to 4 months of therapy and stays in your system for a year as the new nail grows out.  Oral antifungal medications do require blood work to make sure it is a safe drug for you.  A liver function panel will be performed prior to starting the medication and after the first month of treatment.  It is important to have the blood work performed to avoid any harmful side effects.  In general, this medication safe but blood work is required. 8. Laser Therapy.  This treatment is performed by applying a specialized laser to the affected nail plate.  This therapy is noninvasive, fast, and non-painful.  It is not covered by insurance and is therefore, out of pocket.  The results have been very good with a 80-95% cure rate.  [facility] is the only practice in the area to offer this therapy. 9. Permanent Nail Avulsion.  Removing the entire nail so that a new nail will not grow back.   

## 2020-04-14 ENCOUNTER — Encounter: Payer: Self-pay | Admitting: Podiatry

## 2020-04-14 NOTE — Progress Notes (Signed)
Subjective: Dorothy Anderson presents today preventative diabetic foot care, for annual diabetic foot examination and painful mycotic nails b/l that are difficult to trim. Pain interferes with ambulation. Aggravating factors include wearing enclosed shoe gear. Pain is relieved with periodic professional debridement.  Leeroy Cha, MD is patient's PCP. Last visit was: 11/15/2019.  Past Medical History:  Diagnosis Date  . Arthritis KNEES   OA  . Calculus of left kidney   . Complication of anesthesia    slow to wake up  . Diabetes mellitus without complication ([place])    PRE DIABETIC ORAL MEDS   . Frequency of urination   . GERD (gastroesophageal reflux disease)   . Glaucoma BILATERAL EYES  . History of kidney stones   . Hydronephrosis, left    DR. STEVEN DAHLSTADT  . Hyperglycemia   . Hypertension   . Nocturia   . Wears glasses      Current Outpatient Medications on File Prior to Visit  Medication Sig Dispense Refill  . aspirin EC 81 MG tablet Take 81 mg by mouth at bedtime.     . brimonidine (ALPHAGAN) 0.2 % ophthalmic solution Place 1 drop into both eyes 2 (two) times a day.     Marland Kitchen CALCIUM-VITAMIN D PO Take 1 tablet by mouth daily.    . cephALEXin (KEFLEX) 250 MG capsule TK 1 C PO Q 8 H    . cholecalciferol (VITAMIN D) 1000 UNITS tablet Take 1,000 Units by mouth daily.     . ciprofloxacin (CIPRO) 500 MG tablet TK 1 T PO Q 12 H FOR 7 DAYS    . docusate sodium (COLACE) 100 MG capsule Take 100 mg by mouth 2 (two) times daily.     . DULoxetine (CYMBALTA) 30 MG capsule Take 30 mg by mouth daily.     . famotidine (PEPCID) 20 MG tablet Take 20 mg by mouth at bedtime as needed.    Marland Kitchen HYDROcodone-acetaminophen (NORCO/VICODIN) 5-325 MG tablet Take 1 tablet by mouth See admin instructions. Take twice daily. Can take a third as needed for pain    . Lancets (ONETOUCH DELICA PLUS [id number]) MISC U 1 LANCET ONCE A DAY    . lansoprazole (PREVACID) 15 MG capsule Take 15 mg by mouth 2 (two)  times daily.     Marland Kitchen latanoprost (XALATAN) 0.005 % ophthalmic solution Place 1 drop into both eyes at bedtime.     . metFORMIN (GLUCOPHAGE-XR) 500 MG 24 hr tablet Take 1,000 mg by mouth daily with breakfast.     . metoprolol tartrate (LOPRESSOR) 25 MG tablet Take 25 mg by mouth 2 (two) times daily.     . Multiple Vitamins-Minerals (MULTIVITAMINS THER. W/MINERALS) TABS Take 1 tablet by mouth daily.     [name] test strip U ONCE A DAY    . oxybutynin (DITROPAN) 5 MG tablet     . oxybutynin (DITROPAN-XL) 5 MG 24 hr tablet     . SHINGRIX injection      No current facility-administered medications on file prior to visit.     Allergies  Allergen Reactions  . Amiloride Other (See Comments)    High blood level of potassium  . Atorvastatin Calcium Other (See Comments)    Muscle weakness and pain  . Darvon Other (See Comments)    MADE PT STAY AWAKE   . Macrobid [Nitrofurantoin Monohydrate Macrocrystals] Nausea And Vomiting  . Nitrofurantoin Nausea And Vomiting  . Propoxyphene Other (See Comments)    MADE PT STAY AWAKE  Objective: Dorothy Anderson is a pleasant 79 y.o. y.o. Patient Race: White or Caucasian [1]  female in NAD. AAO x 3.  There were no vitals filed for this visit.  Vascular Examination: Neurovascular status unchanged b/l lower extremities. Capillary refill time to digits immediate b/l. Faintly palpable pedal pulses b/l. Pedal hair absent b/l. Skin temperature gradient within normal limits b/l.  Dermatological Examination: Pedal skin with normal turgor, texture and tone bilaterally. No open wounds bilaterally. No interdigital macerations bilaterally. Toenails 1-5 b/l elongated, discolored, dystrophic, thickened, crumbly with subungual debris and tenderness to dorsal palpation.  Musculoskeletal: Normal muscle strength 5/5 to all lower extremity muscle groups bilaterally. No pain crepitus or joint limitation noted with ROM b/l. Hammertoes noted to the L 2nd toe and R  2nd toe.   Footwear Assessment: Does the patient wear appropriate shoes? Yes. Does the patient need inserts/orthotics? No.  Neurological Examination: Protective sensation intact 5/5 intact bilaterally with 10g monofilament b/l. Vibratory sensation intact b/l. Proprioception intact bilaterally.  Assessment: 1. Pain due to onychomycosis of toenails of both feet   2. Hammer toes of both feet   3. Controlled type 2 diabetes mellitus without complication, without long-term current use of insulin ([place])   4. Encounter for diabetic foot exam ([place])    Risk Categorization: Low Risk :  Patient has all of the following: Intact protective sensation No prior foot ulcer  No severe deformity Pedal pulses present  Plan: -Examined patient. -No new findings. No new orders. -Diabetic foot examination performed on today's visit. -Continue diabetic foot care principles. Literature dispensed on today.  -Toenails 1-5 b/l were debrided in length and girth with sterile nail nippers and dremel without iatrogenic bleeding.  -Patient to continue soft, supportive shoe gear daily. -Patient to report any pedal injuries to medical professional immediately. -Patient/POA to call should there be question/concern in the interim.  Return in about 3 months (around 07/10/2020) for diabetic nail trim.  Marzetta Board, DPM

## 2020-04-23 DIAGNOSIS — R29898 Other symptoms and signs involving the musculoskeletal system: Secondary | ICD-10-CM | POA: Diagnosis not present

## 2020-04-23 DIAGNOSIS — M65331 Trigger finger, right middle finger: Secondary | ICD-10-CM | POA: Diagnosis not present

## 2020-04-23 DIAGNOSIS — Z6830 Body mass index (BMI) 30.0-30.9, adult: Secondary | ICD-10-CM | POA: Diagnosis not present

## 2020-04-23 DIAGNOSIS — M5136 Other intervertebral disc degeneration, lumbar region: Secondary | ICD-10-CM | POA: Diagnosis not present

## 2020-04-23 DIAGNOSIS — M150 Primary generalized (osteo)arthritis: Secondary | ICD-10-CM | POA: Diagnosis not present

## 2020-04-23 DIAGNOSIS — E669 Obesity, unspecified: Secondary | ICD-10-CM | POA: Diagnosis not present

## 2020-04-23 DIAGNOSIS — N182 Chronic kidney disease, stage 2 (mild): Secondary | ICD-10-CM | POA: Diagnosis not present

## 2020-04-23 DIAGNOSIS — M8580 Other specified disorders of bone density and structure, unspecified site: Secondary | ICD-10-CM | POA: Diagnosis not present

## 2020-05-24 DIAGNOSIS — I10 Essential (primary) hypertension: Secondary | ICD-10-CM | POA: Diagnosis not present

## 2020-05-24 DIAGNOSIS — E1169 Type 2 diabetes mellitus with other specified complication: Secondary | ICD-10-CM | POA: Diagnosis not present

## 2020-05-24 DIAGNOSIS — Z7984 Long term (current) use of oral hypoglycemic drugs: Secondary | ICD-10-CM | POA: Diagnosis not present

## 2020-05-24 DIAGNOSIS — N1831 Chronic kidney disease, stage 3a: Secondary | ICD-10-CM | POA: Diagnosis not present

## 2020-07-22 ENCOUNTER — Encounter: Payer: Self-pay | Admitting: Podiatry

## 2020-07-22 ENCOUNTER — Ambulatory Visit (INDEPENDENT_AMBULATORY_CARE_PROVIDER_SITE_OTHER): Payer: Medicare Other | Admitting: Podiatry

## 2020-07-22 ENCOUNTER — Other Ambulatory Visit: Payer: Self-pay

## 2020-07-22 DIAGNOSIS — M79675 Pain in left toe(s): Secondary | ICD-10-CM

## 2020-07-22 DIAGNOSIS — M79674 Pain in right toe(s): Secondary | ICD-10-CM

## 2020-07-22 DIAGNOSIS — B351 Tinea unguium: Secondary | ICD-10-CM

## 2020-07-23 NOTE — Progress Notes (Signed)
Subjective: Dorothy Anderson presents today preventative diabetic foot care and painful thick toenails that are difficult to trim. Pain interferes with ambulation. Aggravating factors include wearing enclosed shoe gear. Pain is relieved with periodic professional debridement.  She voices no new pedal concerns on today's visit.  Dorothy Cha, MD is patient's PCP. Last visit was: 11/15/2019.  Past Medical History:  Diagnosis Date  . Arthritis KNEES   OA  . Calculus of left kidney   . Complication of anesthesia    slow to wake up  . Diabetes mellitus without complication ([place])    PRE DIABETIC ORAL MEDS   . Frequency of urination   . GERD (gastroesophageal reflux disease)   . Glaucoma BILATERAL EYES  . History of kidney stones   . Hydronephrosis, left    Dorothy Anderson  . Hyperglycemia   . Hypertension   . Nocturia   . Wears glasses      Current Outpatient Medications on File Prior to Visit  Medication Sig Dispense Refill  . aspirin EC 81 MG tablet Take 81 mg by mouth at bedtime.     . brimonidine (ALPHAGAN) 0.2 % ophthalmic solution Place 1 drop into both eyes 2 (two) times a day.     Marland Kitchen CALCIUM-VITAMIN D PO Take 1 tablet by mouth daily.    . cephALEXin (KEFLEX) 250 MG capsule TK 1 C PO Q 8 H    . cholecalciferol (VITAMIN D) 1000 UNITS tablet Take 1,000 Units by mouth daily.     . ciprofloxacin (CIPRO) 500 MG tablet TK 1 T PO Q 12 H FOR 7 DAYS    . docusate sodium (COLACE) 100 MG capsule Take 100 mg by mouth 2 (two) times daily.     . DULoxetine (CYMBALTA) 30 MG capsule Take 30 mg by mouth daily.     . famotidine (PEPCID) 20 MG tablet Take 20 mg by mouth at bedtime as needed.    Marland Kitchen HYDROcodone-acetaminophen (NORCO/VICODIN) 5-325 MG tablet Take 1 tablet by mouth See admin instructions. Take twice daily. Can take a third as needed for pain    . Lancets (ONETOUCH DELICA PLUS [id number]) MISC U 1 LANCET ONCE A DAY    . lansoprazole (PREVACID) 15 MG capsule Take 15 mg by  mouth 2 (two) times daily.     Marland Kitchen latanoprost (XALATAN) 0.005 % ophthalmic solution Place 1 drop into both eyes at bedtime.     . metFORMIN (GLUCOPHAGE-XR) 500 MG 24 hr tablet Take 1,000 mg by mouth daily with breakfast.     . metoprolol tartrate (LOPRESSOR) 25 MG tablet Take 25 mg by mouth 2 (two) times daily.     . Multiple Vitamins-Minerals (MULTIVITAMINS THER. W/MINERALS) TABS Take 1 tablet by mouth daily.     Dorothy Anderson VERIO test strip U ONCE A DAY    . oxybutynin (DITROPAN) 5 MG tablet     . oxybutynin (DITROPAN-XL) 5 MG 24 hr tablet     . pravastatin (PRAVACHOL) 10 MG tablet Take 10 mg by mouth at bedtime.    Marland Kitchen SHINGRIX injection      No current facility-administered medications on file prior to visit.     Allergies  Allergen Reactions  . Amiloride Other (See Comments)    High blood level of potassium  . Atorvastatin Calcium Other (See Comments)    Muscle weakness and pain  . Darvon Other (See Comments)    MADE PT STAY AWAKE   . Macrobid [Nitrofurantoin Monohydrate Macrocrystals] Nausea And Vomiting  .  Nitrofurantoin Nausea And Vomiting  . Propoxyphene Other (See Comments)    MADE PT STAY AWAKE    Objective: Dorothy Anderson is a pleasant 79 y.o. y.o. Patient Race: White or Caucasian [1]  female in NAD. AAO x 3.  There were no vitals filed for this visit.  Vascular Examination: Neurovascular status unchanged b/l lower extremities. Capillary refill time to digits immediate b/l. Faintly palpable pedal pulses b/l. Pedal hair absent b/l. Skin temperature gradient within normal limits b/l.  Dermatological Examination: Pedal skin with normal turgor, texture and tone bilaterally. No open wounds bilaterally. No interdigital macerations bilaterally. Toenails 1-5 b/l elongated, discolored, dystrophic, thickened, crumbly with subungual debris and tenderness to dorsal palpation.  Musculoskeletal: Normal muscle strength 5/5 to all lower extremity muscle groups bilaterally. No pain  crepitus or joint limitation noted with ROM b/l. Hammertoes noted to the L 2nd toe and R 2nd toe.   Neurological Examination: Protective sensation intact 5/5 intact bilaterally with 10g monofilament b/l. Vibratory sensation intact b/l. Proprioception intact bilaterally.  Assessment: 1. Pain due to onychomycosis of toenails of both feet    Plan: -Examined patient. -No new findings. No new orders. -Continue diabetic foot care principles. -Toenails 1-5 b/l were debrided in length and girth with sterile nail nippers and dremel without iatrogenic bleeding.  -Patient to report any pedal injuries to medical professional immediately. -Patient to continue soft, supportive shoe gear daily. -Patient/POA to call should there be question/concern in the interim.  Return in about 3 months (around 10/21/2020).  Dorothy Anderson, DPM

## 2020-08-06 DIAGNOSIS — N182 Chronic kidney disease, stage 2 (mild): Secondary | ICD-10-CM | POA: Diagnosis not present

## 2020-08-06 DIAGNOSIS — M79622 Pain in left upper arm: Secondary | ICD-10-CM | POA: Diagnosis not present

## 2020-08-06 DIAGNOSIS — Z6828 Body mass index (BMI) 28.0-28.9, adult: Secondary | ICD-10-CM | POA: Diagnosis not present

## 2020-08-06 DIAGNOSIS — M5136 Other intervertebral disc degeneration, lumbar region: Secondary | ICD-10-CM | POA: Diagnosis not present

## 2020-08-06 DIAGNOSIS — E663 Overweight: Secondary | ICD-10-CM | POA: Diagnosis not present

## 2020-08-06 DIAGNOSIS — Z7984 Long term (current) use of oral hypoglycemic drugs: Secondary | ICD-10-CM | POA: Diagnosis not present

## 2020-08-06 DIAGNOSIS — M65331 Trigger finger, right middle finger: Secondary | ICD-10-CM | POA: Diagnosis not present

## 2020-08-06 DIAGNOSIS — M150 Primary generalized (osteo)arthritis: Secondary | ICD-10-CM | POA: Diagnosis not present

## 2020-08-06 DIAGNOSIS — E1169 Type 2 diabetes mellitus with other specified complication: Secondary | ICD-10-CM | POA: Diagnosis not present

## 2020-08-06 DIAGNOSIS — M8580 Other specified disorders of bone density and structure, unspecified site: Secondary | ICD-10-CM | POA: Diagnosis not present

## 2020-08-06 DIAGNOSIS — R29898 Other symptoms and signs involving the musculoskeletal system: Secondary | ICD-10-CM | POA: Diagnosis not present

## 2020-08-06 DIAGNOSIS — K219 Gastro-esophageal reflux disease without esophagitis: Secondary | ICD-10-CM | POA: Diagnosis not present

## 2020-08-12 DIAGNOSIS — Z23 Encounter for immunization: Secondary | ICD-10-CM | POA: Diagnosis not present

## 2020-08-27 DIAGNOSIS — Z23 Encounter for immunization: Secondary | ICD-10-CM | POA: Diagnosis not present

## 2020-09-02 DIAGNOSIS — N183 Chronic kidney disease, stage 3 unspecified: Secondary | ICD-10-CM | POA: Diagnosis not present

## 2020-09-02 DIAGNOSIS — Z87442 Personal history of urinary calculi: Secondary | ICD-10-CM | POA: Diagnosis not present

## 2020-09-02 DIAGNOSIS — E559 Vitamin D deficiency, unspecified: Secondary | ICD-10-CM | POA: Diagnosis not present

## 2020-09-02 DIAGNOSIS — E875 Hyperkalemia: Secondary | ICD-10-CM | POA: Diagnosis not present

## 2020-09-02 DIAGNOSIS — I129 Hypertensive chronic kidney disease with stage 1 through stage 4 chronic kidney disease, or unspecified chronic kidney disease: Secondary | ICD-10-CM | POA: Diagnosis not present

## 2020-09-09 DIAGNOSIS — H1131 Conjunctival hemorrhage, right eye: Secondary | ICD-10-CM | POA: Diagnosis not present

## 2020-09-09 DIAGNOSIS — H04123 Dry eye syndrome of bilateral lacrimal glands: Secondary | ICD-10-CM | POA: Diagnosis not present

## 2020-09-09 DIAGNOSIS — H401132 Primary open-angle glaucoma, bilateral, moderate stage: Secondary | ICD-10-CM | POA: Diagnosis not present

## 2020-09-09 DIAGNOSIS — Z961 Presence of intraocular lens: Secondary | ICD-10-CM | POA: Diagnosis not present

## 2020-10-16 DIAGNOSIS — Z961 Presence of intraocular lens: Secondary | ICD-10-CM | POA: Diagnosis not present

## 2020-10-16 DIAGNOSIS — H04123 Dry eye syndrome of bilateral lacrimal glands: Secondary | ICD-10-CM | POA: Diagnosis not present

## 2020-10-16 DIAGNOSIS — H401132 Primary open-angle glaucoma, bilateral, moderate stage: Secondary | ICD-10-CM | POA: Diagnosis not present

## 2020-10-21 ENCOUNTER — Ambulatory Visit (INDEPENDENT_AMBULATORY_CARE_PROVIDER_SITE_OTHER): Payer: Medicare Other | Admitting: Podiatry

## 2020-10-21 ENCOUNTER — Encounter: Payer: Self-pay | Admitting: Podiatry

## 2020-10-21 ENCOUNTER — Other Ambulatory Visit: Payer: Self-pay

## 2020-10-21 DIAGNOSIS — E119 Type 2 diabetes mellitus without complications: Secondary | ICD-10-CM | POA: Diagnosis not present

## 2020-10-21 DIAGNOSIS — M2042 Other hammer toe(s) (acquired), left foot: Secondary | ICD-10-CM

## 2020-10-21 DIAGNOSIS — B351 Tinea unguium: Secondary | ICD-10-CM | POA: Diagnosis not present

## 2020-10-21 DIAGNOSIS — M2041 Other hammer toe(s) (acquired), right foot: Secondary | ICD-10-CM

## 2020-10-21 DIAGNOSIS — M79675 Pain in left toe(s): Secondary | ICD-10-CM

## 2020-10-21 DIAGNOSIS — M79674 Pain in right toe(s): Secondary | ICD-10-CM

## 2020-10-25 DIAGNOSIS — Z87442 Personal history of urinary calculi: Secondary | ICD-10-CM | POA: Diagnosis not present

## 2020-10-25 DIAGNOSIS — Z789 Other specified health status: Secondary | ICD-10-CM | POA: Diagnosis not present

## 2020-10-25 DIAGNOSIS — E119 Type 2 diabetes mellitus without complications: Secondary | ICD-10-CM | POA: Diagnosis not present

## 2020-10-25 DIAGNOSIS — Z7984 Long term (current) use of oral hypoglycemic drugs: Secondary | ICD-10-CM | POA: Diagnosis not present

## 2020-10-26 DIAGNOSIS — Z860100 Personal history of colon polyps, unspecified: Secondary | ICD-10-CM | POA: Insufficient documentation

## 2020-10-26 DIAGNOSIS — N3281 Overactive bladder: Secondary | ICD-10-CM | POA: Insufficient documentation

## 2020-10-26 DIAGNOSIS — Z6829 Body mass index (BMI) 29.0-29.9, adult: Secondary | ICD-10-CM | POA: Insufficient documentation

## 2020-10-26 DIAGNOSIS — K219 Gastro-esophageal reflux disease without esophagitis: Secondary | ICD-10-CM | POA: Insufficient documentation

## 2020-10-26 DIAGNOSIS — R9389 Abnormal findings on diagnostic imaging of other specified body structures: Secondary | ICD-10-CM | POA: Insufficient documentation

## 2020-10-26 NOTE — Progress Notes (Signed)
Subjective:  Patient ID: Dorothy Anderson, female    DOB: 1941-01-26,  MRN: [id number]  79 y.o. female presents with at risk foot care. Pt has h/o NIDDM with chronic kidney disease and painful thick toenails that are difficult to trim. Pain interferes with ambulation. Aggravating factors include wearing enclosed shoe gear. Pain is relieved with periodic professional debridement.Marland Kitchen    PCP: Leeroy Cha, MD and last visit was: 08/06/2020. She also sees Dr. Delrae Rend and last visit was 04/25/2020.  Review of Systems: Negative except as noted in the HPI.  Past Medical History:  Diagnosis Date  . Arthritis KNEES   OA  . Calculus of left kidney   . Complication of anesthesia    slow to wake up  . Diabetes mellitus without complication ([place])    PRE DIABETIC ORAL MEDS   . Frequency of urination   . GERD (gastroesophageal reflux disease)   . Glaucoma BILATERAL EYES  . History of kidney stones   . Hydronephrosis, left    DR. STEVEN DAHLSTADT  . Hyperglycemia   . Hypertension   . Nocturia   . Wears glasses    Past Surgical History:  Procedure Laterality Date  . CARPAL TUNNEL RELEASE  06-17-2005   RIGHT  . CATARACT EXTRACTION W/ INTRAOCULAR LENS  IMPLANT, BILATERAL    . CYSTO/ LEFT RETROGRADE PYELOGRAM/ LEFT URETERAL STENT PLACEMENT  04-15-2005   URETERAL STONE  . CYSTOSCOPY/RETROGRADE/URETEROSCOPY  01/27/2012   Procedure: CYSTOSCOPY/RETROGRADE/URETEROSCOPY;  Surgeon: Franchot Gallo, MD;  Location: [facility];  Service: Urology;  Laterality: Right;  . EXTRACORPOREAL SHOCK WAVE LITHOTRIPSY  11-09-11   RIGHT  . TONSILLECTOMY AND ADENOIDECTOMY  AGE 14  . TOTAL KNEE ARTHROPLASTY  09-29-2005   RIGHT  . TOTAL KNEE ARTHROPLASTY Left 01/17/2013   Procedure: TOTAL KNEE ARTHROPLASTY;  Surgeon: Meredith Pel, MD;  Location: [place];  Service: Orthopedics;  Laterality: Left;  Left Total Knee Arthroplasty   Patient Active Problem List   Diagnosis Date Noted  .  Ptosis of both eyelids 01/28/2015  . Diabetes type 2, controlled ([place]) 10/25/2014  . High blood pressure 10/25/2014  . Osteoarthritis of left knee 01/17/2013    Class: Diagnosis of  . Osteoarthrosis involving lower leg 01/17/2013    Current Outpatient Medications:  .  aspirin EC 81 MG tablet, Take 81 mg by mouth at bedtime. , Disp: , Rfl:  .  brimonidine (ALPHAGAN) 0.2 % ophthalmic solution, Place 1 drop into both eyes 2 (two) times a day. , Disp: , Rfl:  .  CALCIUM-VITAMIN D PO, Take 1 tablet by mouth daily., Disp: , Rfl:  .  cephALEXin (KEFLEX) 250 MG capsule, TK 1 C PO Q 8 H, Disp: , Rfl:  .  cholecalciferol (VITAMIN D) 1000 UNITS tablet, Take 1,000 Units by mouth daily. , Disp: , Rfl:  .  ciprofloxacin (CIPRO) 500 MG tablet, TK 1 T PO Q 12 H FOR 7 DAYS, Disp: , Rfl:  .  docusate sodium (COLACE) 100 MG capsule, Take 100 mg by mouth 2 (two) times daily. , Disp: , Rfl:  .  DULoxetine (CYMBALTA) 30 MG capsule, Take 30 mg by mouth daily. , Disp: , Rfl:  .  famotidine (PEPCID) 20 MG tablet, Take 20 mg by mouth at bedtime as needed., Disp: , Rfl:  .  HYDROcodone-acetaminophen (NORCO/VICODIN) 5-325 MG tablet, Take 1 tablet by mouth See admin instructions. Take twice daily. Can take a third as needed for pain, Disp: , Rfl:  .  Lancets (  ONETOUCH DELICA PLUS [id number]) MISC, U 1 LANCET ONCE A DAY, Disp: , Rfl:  .  lansoprazole (PREVACID) 15 MG capsule, Take 15 mg by mouth 2 (two) times daily. , Disp: , Rfl:  .  latanoprost (XALATAN) 0.005 % ophthalmic solution, Place 1 drop into both eyes at bedtime. , Disp: , Rfl:  .  metFORMIN (GLUCOPHAGE-XR) 500 MG 24 hr tablet, Take 1,000 mg by mouth daily with breakfast. , Disp: , Rfl:  .  metoprolol tartrate (LOPRESSOR) 25 MG tablet, Take 25 mg by mouth 2 (two) times daily. , Disp: , Rfl:  .  Multiple Vitamins-Minerals (MULTIVITAMINS THER. W/MINERALS) TABS, Take 1 tablet by mouth daily. , Disp: , Rfl:  .  ONETOUCH VERIO test strip, U ONCE A DAY, Disp: , Rfl:   .  oxybutynin (DITROPAN) 5 MG tablet, , Disp: , Rfl:  .  oxybutynin (DITROPAN-XL) 5 MG 24 hr tablet, , Disp: , Rfl:  .  pravastatin (PRAVACHOL) 10 MG tablet, Take 10 mg by mouth at bedtime., Disp: , Rfl:  .  SHINGRIX injection, , Disp: , Rfl:  Allergies  Allergen Reactions  . Amiloride Other (See Comments)    High blood level of potassium  . Atorvastatin Calcium Other (See Comments)    Muscle weakness and pain  . Darvon Other (See Comments)    MADE PT STAY AWAKE   . Macrobid [Nitrofurantoin Monohydrate Macrocrystals] Nausea And Vomiting  . Nitrofurantoin Nausea And Vomiting  . Propoxyphene Other (See Comments)    MADE PT STAY AWAKE   Social History   Tobacco Use  Smoking Status Never Smoker  Smokeless Tobacco Never Used    Objective:  There were no vitals filed for this visit. Constitutional Patient is a pleasant 79 y.o. Caucasian female in NAD. AAO x 3.  Vascular Capillary refill time to digits immediate b/l. Faintly palpable pedal pulses b/l. Pedal hair absent. Lower extremity skin temperature gradient within normal limits. No cyanosis or clubbing noted.  Neurologic Normal speech. Protective sensation intact 5/5 intact bilaterally with 10g monofilament b/l. Vibratory sensation intact b/l.  Dermatologic Pedal skin with normal turgor, texture and tone bilaterally. No open wounds bilaterally. No interdigital macerations bilaterally. Toenails 1-5 b/l elongated, discolored, dystrophic, thickened, crumbly with subungual debris and tenderness to dorsal palpation.  Orthopedic: Normal muscle strength 5/5 to all lower extremity muscle groups bilaterally. No pain crepitus or joint limitation noted with ROM b/l. Hammertoes noted to the L 2nd toe and R 2nd toe.    Assessment:   1. Pain due to onychomycosis of toenails of both feet   2. Hammer toes of both feet   3. Controlled type 2 diabetes mellitus without complication, without long-term current use of insulin ([place])    Plan:  Patient  was evaluated and treated and all questions answered.  Onychomycosis with pain -Nails palliatively debridement as below. -Educated on self-care  Procedure: Nail Debridement Rationale: Pain Type of Debridement: manual, sharp debridement. Instrumentation: Nail nipper, rotary burr. Number of Nails: 10  -Examined patient. -No new findings. No new orders. -Continue diabetic foot care principles. -Patient to continue soft, supportive shoe gear daily. -Toenails 1-5 b/l were debrided in length and girth with sterile nail nippers and dremel without iatrogenic bleeding.  -Patient to report any pedal injuries to medical professional immediately. -Patient/POA to call should there be question/concern in the interim.  Return in about 6 months (around 04/21/2021) for painful mycotic toenails.  Marzetta Board, DPM

## 2020-10-29 DIAGNOSIS — Z6828 Body mass index (BMI) 28.0-28.9, adult: Secondary | ICD-10-CM | POA: Diagnosis not present

## 2020-10-29 DIAGNOSIS — R29898 Other symptoms and signs involving the musculoskeletal system: Secondary | ICD-10-CM | POA: Diagnosis not present

## 2020-10-29 DIAGNOSIS — M5136 Other intervertebral disc degeneration, lumbar region: Secondary | ICD-10-CM | POA: Diagnosis not present

## 2020-10-29 DIAGNOSIS — N182 Chronic kidney disease, stage 2 (mild): Secondary | ICD-10-CM | POA: Diagnosis not present

## 2020-10-29 DIAGNOSIS — E663 Overweight: Secondary | ICD-10-CM | POA: Diagnosis not present

## 2020-10-29 DIAGNOSIS — M8580 Other specified disorders of bone density and structure, unspecified site: Secondary | ICD-10-CM | POA: Diagnosis not present

## 2020-10-29 DIAGNOSIS — M150 Primary generalized (osteo)arthritis: Secondary | ICD-10-CM | POA: Diagnosis not present

## 2020-10-29 DIAGNOSIS — M65331 Trigger finger, right middle finger: Secondary | ICD-10-CM | POA: Diagnosis not present

## 2020-10-29 DIAGNOSIS — M79622 Pain in left upper arm: Secondary | ICD-10-CM | POA: Diagnosis not present

## 2020-11-20 DIAGNOSIS — I10 Essential (primary) hypertension: Secondary | ICD-10-CM | POA: Diagnosis not present

## 2020-11-20 DIAGNOSIS — N3281 Overactive bladder: Secondary | ICD-10-CM | POA: Diagnosis not present

## 2020-11-20 DIAGNOSIS — Z0000 Encounter for general adult medical examination without abnormal findings: Secondary | ICD-10-CM | POA: Diagnosis not present

## 2020-11-20 DIAGNOSIS — Z1389 Encounter for screening for other disorder: Secondary | ICD-10-CM | POA: Diagnosis not present

## 2020-11-20 DIAGNOSIS — E1169 Type 2 diabetes mellitus with other specified complication: Secondary | ICD-10-CM | POA: Diagnosis not present

## 2020-11-20 DIAGNOSIS — K219 Gastro-esophageal reflux disease without esophagitis: Secondary | ICD-10-CM | POA: Diagnosis not present

## 2020-11-20 DIAGNOSIS — Z87442 Personal history of urinary calculi: Secondary | ICD-10-CM | POA: Diagnosis not present

## 2020-12-22 IMAGING — CT CT RENAL STONE PROTOCOL
2 of 4 series · 17 of 46 positions shown, 19 images · non-contrast
Comparison: 08/31/2017 and 10/09/2011 from [REDACTED]

CLINICAL DATA: Acute onset right flank pain this morning.
Nephrolithiasis.

EXAM:
CT ABDOMEN AND PELVIS WITHOUT CONTRAST
TECHNIQUE: Multidetector CT imaging of the abdomen and pelvis was performed
following the standard protocol without IV contrast.

[Series 3: renal stone 5.0 · axial · 0.92mm/px · z∈[+767,+1212]mm · 14 of 99 slices shown, 16 images]
[im 5/99  soft-tissue]
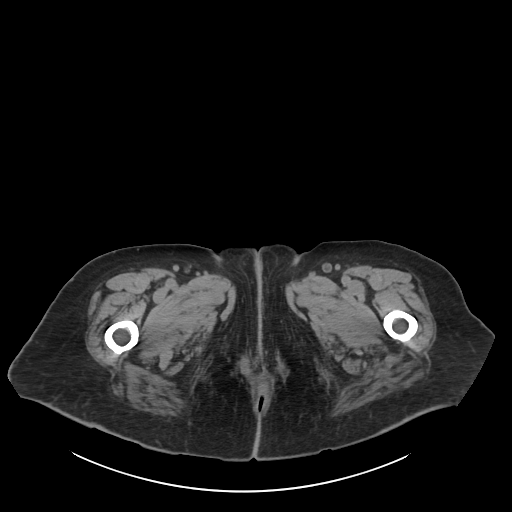
[im 5/99  bone]
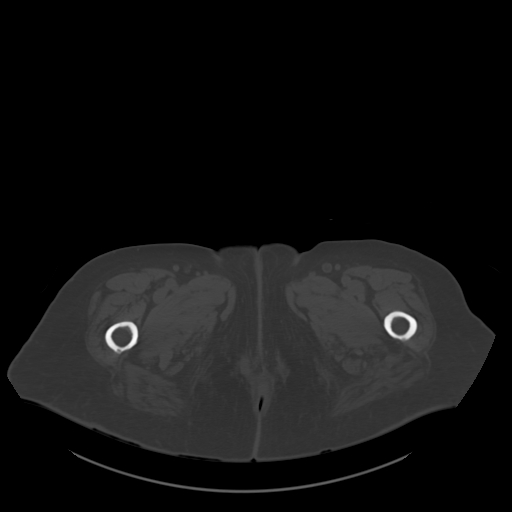
[im 13/99  soft-tissue]
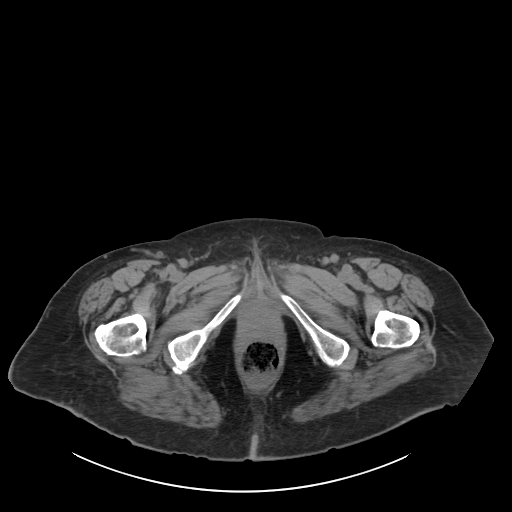
[im 21/99  soft-tissue]
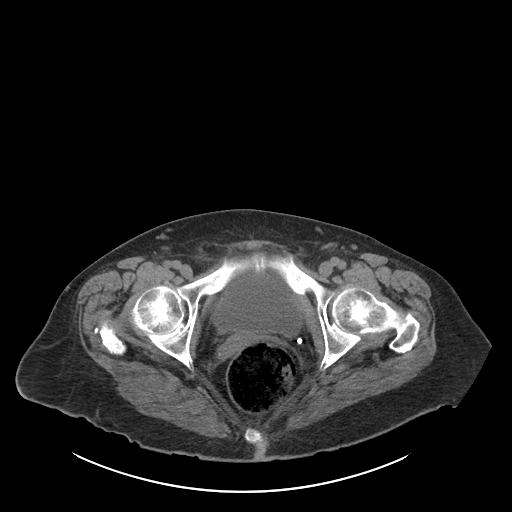
[im 25/99  soft-tissue]
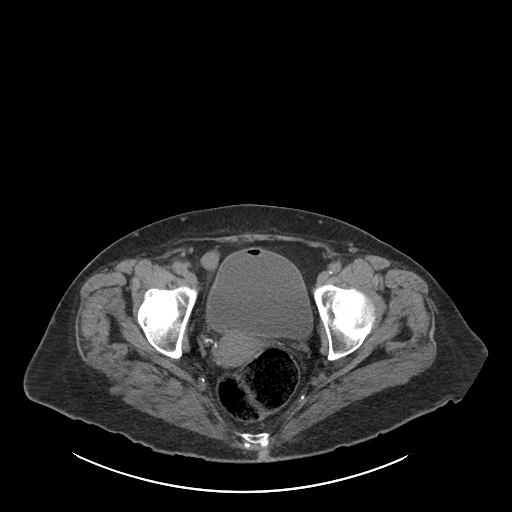
[im 33/99  soft-tissue]
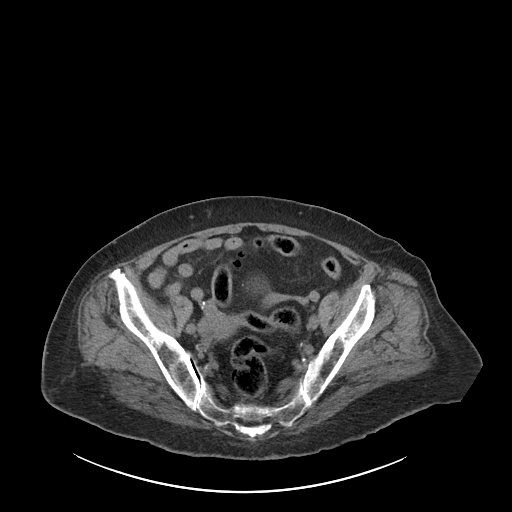
[im 41/99  soft-tissue]
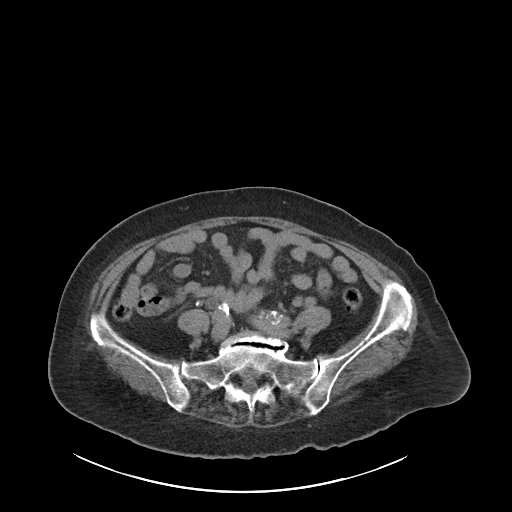
[im 45/99  soft-tissue]
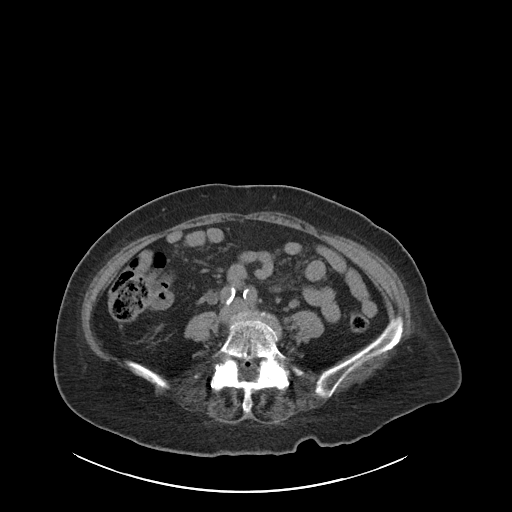
[im 54/99  soft-tissue]
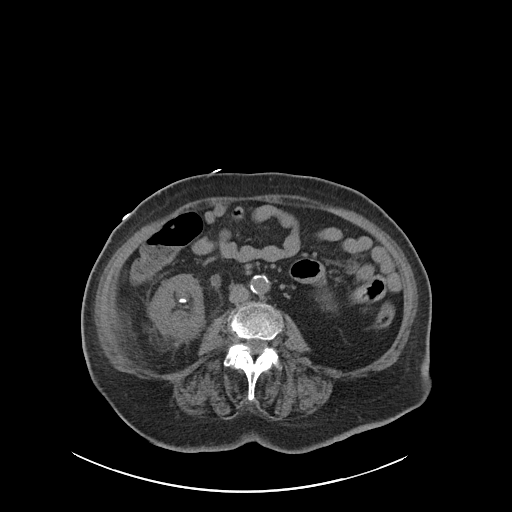
[im 58/99  soft-tissue]
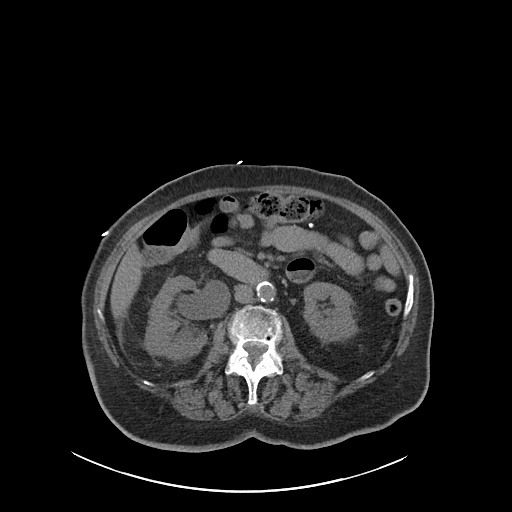
[im 58/99  bone]
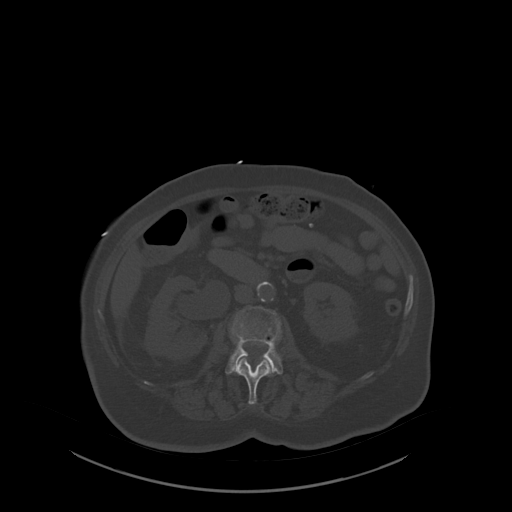
[im 66/99  soft-tissue]
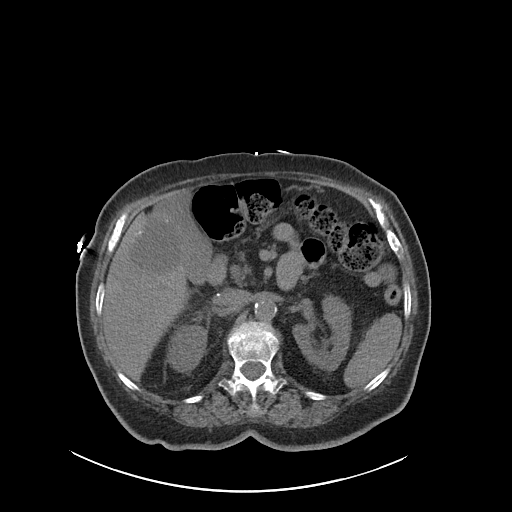
[im 74/99  soft-tissue]
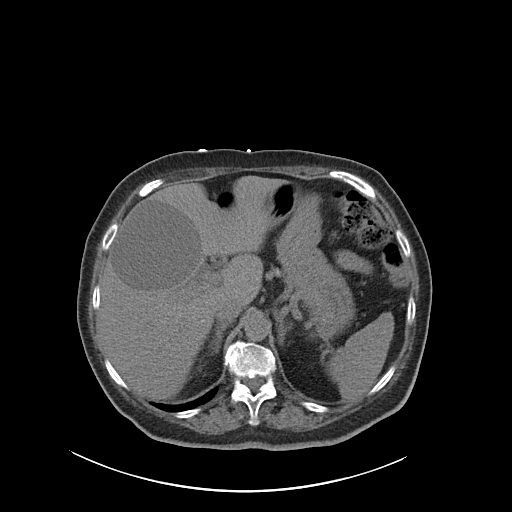
[im 78/99  soft-tissue]
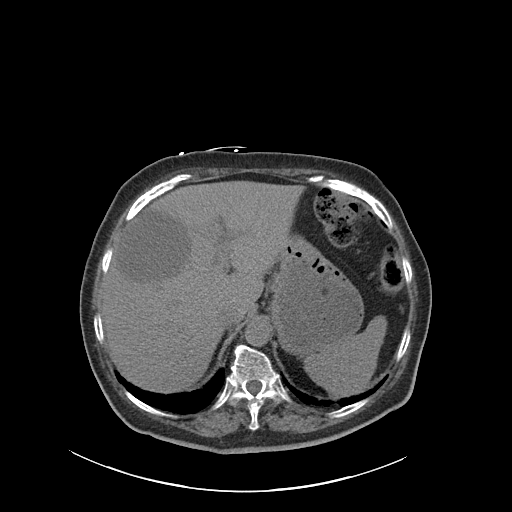
[im 86/99  soft-tissue]
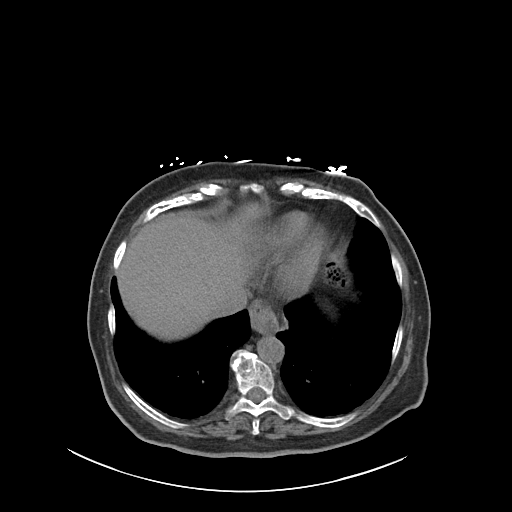
[im 94/99  soft-tissue]
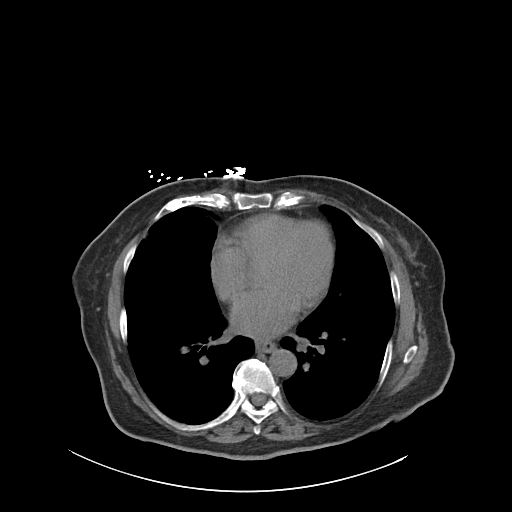

[Series 5: renal stone 3.0 cor · coronal · 1.10mm/px · 3 of 134 slices shown]
[im 45/134  soft-tissue]
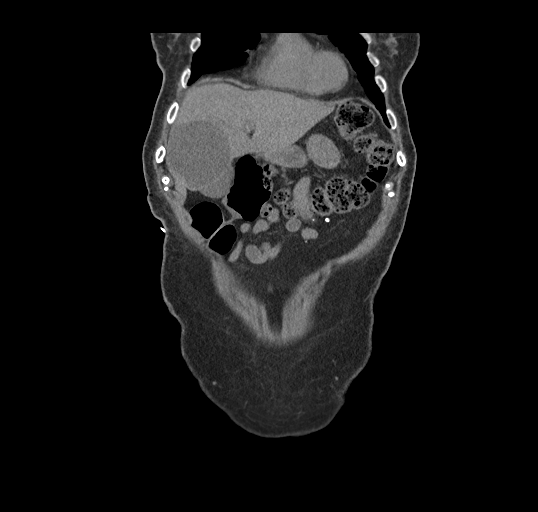
[im 60/134  soft-tissue]
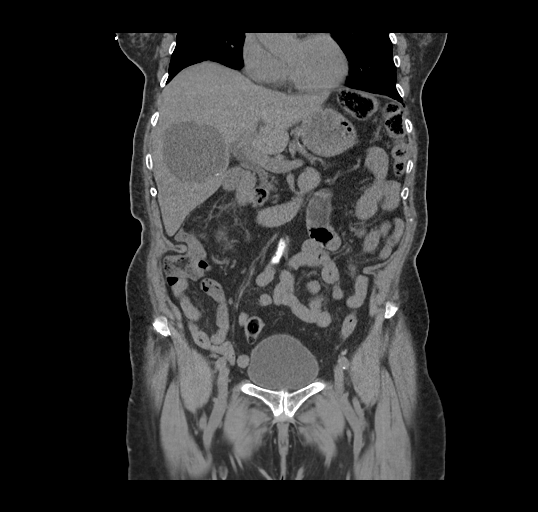
[im 74/134  soft-tissue]
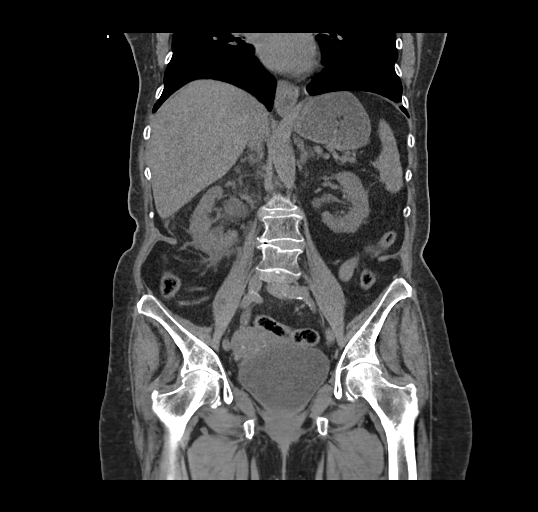

[17 of 46 positions shown; findings below may reference images not displayed]

FINDINGS: Lower chest: No acute findings. Pleural-based nodule in the right
middle lobe measuring 6 mm is stable since 3883, consistent with
benign postinflammatory etiology.

Hepatobiliary: 8 cm fluid attenuation cyst involving both right and
left lobes is stable since previous study. No other liver lesions
seen on this unenhanced exam. Gallbladder is unremarkable.

Pancreas: No mass or inflammatory process visualized on this
unenhanced exam.

Spleen:  Within normal limits in size.

Adrenals/Urinary tract: 6 mm calculus is seen in the lower pole the
right kidney. Moderate right hydroureteronephrosis and perinephric
stranding is seen due to a 4 mm ureteral calculus in the mid pelvis.
Tiny amount of gas in the urinary bladder is likely from recent
catheterization.

Stomach/Bowel: No evidence of obstruction, inflammatory process, or
abnormal fluid collections.

Vascular/Lymphatic: No pathologically enlarged lymph nodes
identified. No evidence of abdominal aortic aneurysm. Aortic
atherosclerosis.

Reproductive:  No mass or other significant abnormality.

Other:  None.

Musculoskeletal:  No suspicious bone lesions identified.
IMPRESSION: 1. Moderate right hydroureteronephrosis and perinephric stranding
due to 4 mm distal right ureteral calculus.
2. 6 mm right renal calculus.

Aortic Atherosclerosis ([id number]-[id number].W).

## 2020-12-25 ENCOUNTER — Ambulatory Visit (INDEPENDENT_AMBULATORY_CARE_PROVIDER_SITE_OTHER): Payer: Medicare Other | Admitting: Orthopedic Surgery

## 2020-12-25 ENCOUNTER — Other Ambulatory Visit: Payer: Self-pay

## 2020-12-25 DIAGNOSIS — M79641 Pain in right hand: Secondary | ICD-10-CM

## 2020-12-25 DIAGNOSIS — M79642 Pain in left hand: Secondary | ICD-10-CM | POA: Diagnosis not present

## 2020-12-28 ENCOUNTER — Encounter: Payer: Self-pay | Admitting: Orthopedic Surgery

## 2020-12-28 NOTE — Progress Notes (Signed)
Office Visit Note   Patient: Dorothy Anderson           Date of Birth: [birth year]           MRN: [id number] Visit Date: 12/25/2020 Requested by: Leeroy Cha, MD [address] [place],  [place] [address] PCP: Leeroy Cha, MD  Subjective: Chief Complaint  Patient presents with  . Right Hand - Hand Pain    Bilateral hand pain     HPI: Dorothy Anderson is a 80 year old patient with bilateral hand pain.  States that the left hand is tingly.  She is losing strength in the left hand.  She cannot open bottles.  Has had prior right carpal tunnel release.  No prior left carpal tunnel release.  Denies any neck pain.  She also reports some locking of the left hand long finger.  This has been going on for longer than 6 months.  She has tried conservative measures without relief.  Pain is becoming more severe              ROS: All systems reviewed are negative as they relate to the chief complaint within the history of present illness.  Patient denies  fevers or chills.   Assessment & Plan: Visit Diagnoses:  1. Pain in right hand   2. Pain in left hand     Plan: Impression is left carpal tunnel syndrome which is likely severe based on the amount of thenar atrophy present.  Has had successful right carpal tunnel release done in the past.  She also has some triggering of the left long finger.  Plan is EMG nerve study left wrist to evaluate carpal tunnel syndrome.  Could consider carpal tunnel release after that.  For now would continue to use wrist splint at night and over-the-counter anti-inflammatories as needed.  Follow-up after that study.  Follow-Up Instructions: No follow-ups on file.   Orders:  Orders Placed This Encounter  Procedures  . DG Hand Complete Left  . DG Hand Complete Right  . Ambulatory referral to Physical Medicine Rehab   No orders of the defined types were placed in this encounter.     Procedures: No procedures performed   Clinical Data: No  additional findings.  Objective: Vital Signs: There were no vitals taken for this visit.  Physical Exam:   Constitutional: Patient appears well-developed HEENT:  Head: Normocephalic Eyes:EOM are normal Neck: Normal range of motion Cardiovascular: Normal rate Pulmonary/chest: Effort normal Neurologic: Patient is alert Skin: Skin is warm Psychiatric: Patient has normal mood and affect    Ortho Exam: Ortho exam demonstrates full active and passive range of motion of both wrist she does have some abductor pollicis brevis wasting on the left compared to the right.  EPL FPL interosseous strength intact.  Radial pulse intact bilaterally.  Cervical spine range of motion is pretty reasonable.  Has tenderness at the A1 pulley middle finger left hand.  No scaphoid tenderness no radial styloid tenderness on either hand.  Grip strength slightly less on the left compared to the right.  Specialty Comments:  No specialty comments available.  Imaging: No results found.   PMFS History: Patient Active Problem List   Diagnosis Date Noted  . Abnormal findings on diagnostic imaging of other specified body structures 10/26/2020  . Body mass index (BMI) 29.0-29.9, adult 10/26/2020  . Esophageal reflux 10/26/2020  . Overactive bladder 10/26/2020  . Personal history of colonic polyps 10/26/2020  . Ptosis of both eyelids 01/28/2015  .  Diabetes type 2, controlled ([place]) 10/25/2014  . High blood pressure 10/25/2014  . Osteoarthritis of left knee 01/17/2013    Class: Diagnosis of  . Osteoarthrosis involving lower leg 01/17/2013   Past Medical History:  Diagnosis Date  . Arthritis KNEES   OA  . Calculus of left kidney   . Complication of anesthesia    slow to wake up  . Diabetes mellitus without complication ([place])    PRE DIABETIC ORAL MEDS   . Frequency of urination   . GERD (gastroesophageal reflux disease)   . Glaucoma BILATERAL EYES  . History of kidney stones   . Hydronephrosis, left     DR. STEVEN DAHLSTADT  . Hyperglycemia   . Hypertension   . Nocturia   . Wears glasses     Family History  Problem Relation Age of Onset  . CVA Mother   . Heart attack Father     Past Surgical History:  Procedure Laterality Date  . CARPAL TUNNEL RELEASE  06-17-2005   RIGHT  . CATARACT EXTRACTION W/ INTRAOCULAR LENS  IMPLANT, BILATERAL    . CYSTO/ LEFT RETROGRADE PYELOGRAM/ LEFT URETERAL STENT PLACEMENT  04-15-2005   URETERAL STONE  . CYSTOSCOPY/RETROGRADE/URETEROSCOPY  01/27/2012   Procedure: CYSTOSCOPY/RETROGRADE/URETEROSCOPY;  Surgeon: Franchot Gallo, MD;  Location: [facility];  Service: Urology;  Laterality: Right;  . EXTRACORPOREAL SHOCK WAVE LITHOTRIPSY  11-09-11   RIGHT  . TONSILLECTOMY AND ADENOIDECTOMY  AGE 23  . TOTAL KNEE ARTHROPLASTY  09-29-2005   RIGHT  . TOTAL KNEE ARTHROPLASTY Left 01/17/2013   Procedure: TOTAL KNEE ARTHROPLASTY;  Surgeon: Meredith Pel, MD;  Location: [facility];  Service: Orthopedics;  Laterality: Left;  Left Total Knee Arthroplasty   Social History   Occupational History  . Not on file  Tobacco Use  . Smoking status: Never Smoker  . Smokeless tobacco: Never Used  Substance and Sexual Activity  . Alcohol use: No  . Drug use: No  . Sexual activity: Not on file

## 2021-01-01 DIAGNOSIS — N210 Calculus in bladder: Secondary | ICD-10-CM | POA: Diagnosis not present

## 2021-01-01 DIAGNOSIS — N3000 Acute cystitis without hematuria: Secondary | ICD-10-CM | POA: Diagnosis not present

## 2021-01-17 DIAGNOSIS — N210 Calculus in bladder: Secondary | ICD-10-CM | POA: Diagnosis not present

## 2021-01-17 DIAGNOSIS — N3000 Acute cystitis without hematuria: Secondary | ICD-10-CM | POA: Diagnosis not present

## 2021-01-23 DIAGNOSIS — N3000 Acute cystitis without hematuria: Secondary | ICD-10-CM | POA: Diagnosis not present

## 2021-01-24 ENCOUNTER — Other Ambulatory Visit: Payer: Self-pay | Admitting: Urology

## 2021-01-28 DIAGNOSIS — N182 Chronic kidney disease, stage 2 (mild): Secondary | ICD-10-CM | POA: Diagnosis not present

## 2021-01-28 DIAGNOSIS — Z6827 Body mass index (BMI) 27.0-27.9, adult: Secondary | ICD-10-CM | POA: Diagnosis not present

## 2021-01-28 DIAGNOSIS — M150 Primary generalized (osteo)arthritis: Secondary | ICD-10-CM | POA: Diagnosis not present

## 2021-01-28 DIAGNOSIS — E663 Overweight: Secondary | ICD-10-CM | POA: Diagnosis not present

## 2021-01-28 DIAGNOSIS — M5136 Other intervertebral disc degeneration, lumbar region: Secondary | ICD-10-CM | POA: Diagnosis not present

## 2021-01-28 DIAGNOSIS — M8580 Other specified disorders of bone density and structure, unspecified site: Secondary | ICD-10-CM | POA: Diagnosis not present

## 2021-02-06 NOTE — Patient Instructions (Addendum)
DUE TO COVID-19 ONLY ONE VISITOR IS ALLOWED TO COME WITH YOU AND STAY IN THE WAITING ROOM ONLY DURING PRE OP AND PROCEDURE DAY OF SURGERY. THE 1 VISITOR  MAY VISIT WITH YOU AFTER SURGERY IN YOUR PRIVATE ROOM DURING VISITING HOURS ONLY!  YOU NEED TO HAVE A COVID 19 TEST ON: 02/13/21 @ 2:00 PM , THIS TEST MUST BE DONE BEFORE SURGERY,  COVID TESTING SITE [place] [address], IT IS ON THE RIGHT GOING OUT [address] APPROXIMATELY  2 MINUTES PAST [employer] ON THE RIGHT. ONCE YOUR COVID TEST IS COMPLETED,  PLEASE BEGIN THE QUARANTINE INSTRUCTIONS AS OUTLINED IN YOUR HANDOUT.                [place]   Your procedure is scheduled on: 02/17/21   Report to [facility] Main  Entrance   Report to admitting at: 7:00 AM     Call this number if you have problems the morning of surgery [phone]    Remember: Do not eat food or drink liquids :After Midnight.   BRUSH YOUR TEETH MORNING OF SURGERY AND RINSE YOUR MOUTH OUT, NO CHEWING GUM CANDY OR MINTS.    Take these medicines the morning of surgery with A SIP OF WATER: duloxetine,famotidine,lansoprazole,metoprolol,ditropan.  How to Manage Your Diabetes Before and After Surgery  Why is it important to control my blood sugar before and after surgery? . Improving blood sugar levels before and after surgery helps healing and can limit problems. . A way of improving blood sugar control is eating a healthy diet by: o  Eating less sugar and carbohydrates o  Increasing activity/exercise o  Talking with your doctor about reaching your blood sugar goals . High blood sugars (greater than 180 mg/dL) can raise your risk of infections and slow your recovery, so you will need to focus on controlling your diabetes during the weeks before surgery. . Make sure that the doctor who takes care of your diabetes knows about your planned surgery including the date and location.  How do I manage my blood sugar before  surgery? . Check your blood sugar at least 4 times a day, starting 2 days before surgery, to make sure that the level is not too high or low. o Check your blood sugar the morning of your surgery when you wake up and every 2 hours until you get to the Short Stay unit. . If your blood sugar is less than 70 mg/dL, you will need to treat for low blood sugar: o Do not take insulin. o Treat a low blood sugar (less than 70 mg/dL) with  cup of clear juice (cranberry or apple), 4 glucose tablets, OR glucose gel. o Recheck blood sugar in 15 minutes after treatment (to make sure it is greater than 70 mg/dL). If your blood sugar is not greater than 70 mg/dL on recheck, call [phone] for further instructions. . Report your blood sugar to the short stay nurse when you get to Short Stay.  . If you are admitted to the hospital after surgery: o Your blood sugar will be checked by the staff and you will probably be given insulin after surgery (instead of oral diabetes medicines) to make sure you have good blood sugar levels. o The goal for blood sugar control after surgery is 80-180 mg/dL.   WHAT DO I DO ABOUT MY DIABETES MEDICATION?  Marland Kitchen Do not take oral diabetes medicines (pills) the morning of surgery.  Marland Kitchen  THE DAY BEFORE SURGERY, take Metformin as usual       . THE MORNING OF SURGERY, DO NOT TAKE ANY DIABETIC MEDICATIONS DAY OF YOUR SURGERY                            _______________________________       [facility] - Preparing for Surgery Before surgery, you can play an important role.  Because skin is not sterile, your skin needs to be as free of germs as possible.  You can reduce the number of germs on your skin by washing with CHG (chlorahexidine gluconate) soap before surgery.  CHG is an antiseptic cleaner which kills germs and bonds with the skin to continue killing germs even after washing. Please DO NOT use if you have an allergy to CHG or antibacterial soaps.  If your skin becomes  reddened/irritated stop using the CHG and inform your nurse when you arrive at Short Stay. Do not shave (including legs and underarms) for at least 48 hours prior to the first CHG shower.  You may shave your face/neck. Please follow these instructions carefully:  1.  Shower with CHG Soap the night before surgery and the  morning of Surgery.  2.  If you choose to wash your hair, wash your hair first as usual with your  normal  shampoo.  3.  After you shampoo, rinse your hair and body thoroughly to remove the  shampoo.                           4.  Use CHG as you would any other liquid soap.  You can apply chg directly  to the skin and wash                       Gently with a scrungie or clean washcloth.  5.  Apply the CHG Soap to your body ONLY FROM THE NECK DOWN.   Do not use on face/ open                           Wound or open sores. Avoid contact with eyes, ears mouth and genitals (private parts).                       Wash face,  Genitals (private parts) with your normal soap.             6.  Wash thoroughly, paying special attention to the area where your surgery  will be performed.  7.  Thoroughly rinse your body with warm water from the neck down.  8.  DO NOT shower/wash with your normal soap after using and rinsing off  the CHG Soap.                9.  Pat yourself dry with a clean towel.            10.  Wear clean pajamas.            11.  Place clean sheets on your bed the night of your first shower and do not  sleep with pets. Day of Surgery : Do not apply any lotions/deodorants the morning of surgery.  Please wear clean clothes to the hospital/surgery center.  FAILURE TO FOLLOW THESE INSTRUCTIONS MAY RESULT IN THE CANCELLATION OF YOUR SURGERY PATIENT SIGNATURE_________________________________  NURSE SIGNATURE__________________________________  ________________________________________________________________________

## 2021-02-07 ENCOUNTER — Encounter (HOSPITAL_COMMUNITY)
Admission: RE | Admit: 2021-02-07 | Discharge: 2021-02-07 | Disposition: A | Discharge status: Home / Self Care | Payer: Medicare Other | Source: Ambulatory Visit | Attending: Urology | Admitting: Urology

## 2021-02-07 ENCOUNTER — Ambulatory Visit (INDEPENDENT_AMBULATORY_CARE_PROVIDER_SITE_OTHER): Payer: Medicare Other | Admitting: Physical Medicine and Rehabilitation

## 2021-02-07 ENCOUNTER — Other Ambulatory Visit: Payer: Self-pay

## 2021-02-07 ENCOUNTER — Encounter (HOSPITAL_COMMUNITY): Payer: Self-pay

## 2021-02-07 ENCOUNTER — Encounter: Payer: Self-pay | Admitting: Physical Medicine and Rehabilitation

## 2021-02-07 DIAGNOSIS — R202 Paresthesia of skin: Secondary | ICD-10-CM | POA: Diagnosis not present

## 2021-02-07 DIAGNOSIS — N210 Calculus in bladder: Secondary | ICD-10-CM | POA: Diagnosis not present

## 2021-02-07 DIAGNOSIS — Z01818 Encounter for other preprocedural examination: Secondary | ICD-10-CM | POA: Diagnosis not present

## 2021-02-07 HISTORY — DX: Pneumonia, unspecified organism: J18.9

## 2021-02-07 LAB — CBC
HCT: 36.8 % (ref 36.0–46.0)
Hemoglobin: 12 g/dL (ref 12.0–15.0)
MCH: 33.6 pg (ref 26.0–34.0)
MCHC: 32.6 g/dL (ref 30.0–36.0)
MCV: 103.1 fL — ABNORMAL HIGH (ref 80.0–100.0)
Platelets: 408 10*3/uL — ABNORMAL HIGH (ref 150–400)
RBC: 3.57 MIL/uL — ABNORMAL LOW (ref 3.87–5.11)
RDW: 19.1 % — ABNORMAL HIGH (ref 11.5–15.5)
WBC: 9.7 10*3/uL (ref 4.0–10.5)
nRBC: 0 % (ref 0.0–0.2)

## 2021-02-07 LAB — BASIC METABOLIC PANEL
Anion gap: 7 (ref 5–15)
BUN: 20 mg/dL (ref 8–23)
CO2: 25 mmol/L (ref 22–32)
Calcium: 9.3 mg/dL (ref 8.9–10.3)
Chloride: 106 mmol/L (ref 98–111)
Creatinine, Ser: 0.9 mg/dL (ref 0.44–1.00)
GFR, Estimated: >60 mL/min (ref >60)
Glucose, Bld: 125 mg/dL — ABNORMAL HIGH (ref 70–99)
Potassium: 4.4 mmol/L (ref 3.5–5.1)
Sodium: 138 mmol/L (ref 135–145)

## 2021-02-07 LAB — GLUCOSE, CAPILLARY: Glucose-Capillary: 128 mg/dL — ABNORMAL HIGH (ref 70–99)

## 2021-02-07 NOTE — Progress Notes (Addendum)
COVID Vaccine Completed: Yes Date COVID Vaccine completed: 08/27/20 [place] COVID vaccine manufacturer: Pfizer     PCP - Dr. Gala Lewandowsky Cardiologist -   Chest x-ray -  EKG -  Stress Test -  ECHO -  Cardiac Cath -  Pacemaker/ICD device last checked:  Sleep Study -  CPAP -   Fasting Blood Sugar - 100's Checks Blood Sugar ___3__ times a week  Blood Thinner Instructions: Aspirin Instructions: Last Dose:  Anesthesia review: Hx: DIA,HTN  Patient denies shortness of breath, fever, cough and chest pain at PAT appointment   Patient verbalized understanding of instructions that were given to them at the PAT appointment. Patient was also instructed that they will need to review over the PAT instructions again at home before surgery.

## 2021-02-07 NOTE — Progress Notes (Signed)
Dorothy Anderson - 80 y.o. female MRN [id number]  Date of birth: [birth year]  Office Visit Note: Visit Date: 02/07/2021 PCP: Leeroy Cha, MD Referred by: Leeroy Cha,*  Subjective: Chief Complaint  Patient presents with  . Left Hand - Numbness   HPI:  Dorothy Anderson is a 80 y.o. female who comes in today at the request of Dr. Anderson Malta for electrodiagnostic study of the Left upper extremities.  Patient is Right hand dominant. Has had prior right carpal tunnel release in 2006.  No prior left carpal tunnel release.  No recent electrodiagnostic studies.  Patient does have type 2 diabetes.  She is non-insulin-dependent.  She reports paresthesias in the median nerve distribution on the left with weakness of the left hand and inability to do a lot of functional work with her left hand and ring out rags etc.  She is having difficulty with fine objects.   ROS Otherwise per HPI.  Assessment & Plan: Visit Diagnoses:    ICD-10-CM   1. Paresthesia of skin  R20.2 NCV with EMG (electromyography)    Plan: Impression: The above electrodiagnostic study is ABNORMAL and reveals evidence of a very severe left median nerve entrapment at the wrist (carpal tunnel syndrome) affecting sensory and motor components. The lesion is characterized by sensory and motor demyelination with evidence of significant axonal injury.  Despite appropriate treatment there will likely be some permanent median nerve damage.   There is no significant electrodiagnostic evidence of any other focal nerve entrapment, brachial plexopathy or cervical radiculopathy.   Recommendations: 1.  Follow-up with referring physician. 2.  Continue current management of symptoms. 3.  Suggest surgical evaluation.  Meds & Orders: No orders of the defined types were placed in this encounter.   Orders Placed This Encounter  Procedures  . NCV with EMG (electromyography)    Follow-up: Return for Anderson Malta, MD..    Procedures: No procedures performed  EMG & NCV Findings: Evaluation of the left median motor nerve showed prolonged distal onset latency (7.1 ms), reduced amplitude (0.0 mV), and decreased conduction velocity (Elbow-Wrist, 33 m/s).  The left median (across palm) sensory nerve showed no response (Wrist) and no response (Palm).  The left ulnar sensory nerve showed prolonged distal peak latency (4.2 ms), reduced amplitude (7.4 V), and decreased conduction velocity (Wrist-5th Digit, 33 m/s).  All remaining nerves (as indicated in the following tables) were within normal limits.    All examined muscles (as indicated in the following table) showed no evidence of electrical instability.    Impression: The above electrodiagnostic study is ABNORMAL and reveals evidence of a very severe left median nerve entrapment at the wrist (carpal tunnel syndrome) affecting sensory and motor components. The lesion is characterized by sensory and motor demyelination with evidence of significant axonal injury.  Despite appropriate treatment there will likely be some permanent median nerve damage.   There is no significant electrodiagnostic evidence of any other focal nerve entrapment, brachial plexopathy or cervical radiculopathy.   Recommendations: 1.  Follow-up with referring physician. 2.  Continue current management of symptoms. 3.  Suggest surgical evaluation.  ___________________________ Laurence Spates FAAPMR Board Certified, American Board of Physical Medicine and Rehabilitation    Nerve Conduction Studies Anti Sensory Summary Table   Stim Site NR Peak (ms) Norm Peak (ms) P-T Amp (V) Norm P-T Amp Site1 Site2 Delta-P (ms) Dist (cm) Vel (m/s) Norm Vel (m/s)  Left Median Acr Palm Anti Sensory (2nd Digit)  31.3C  Wrist *  NR  <3.6  >10 Wrist Palm  0.0    Palm *NR  <2.0          Left Radial Anti Sensory (Base 1st Digit)  32C  Wrist    2.5 <3.1 16.3  Wrist Base 1st Digit 2.5 0.0    Left Ulnar Anti  Sensory (5th Digit)  31.5C  Wrist    *4.2 <3.7 *7.4 >15.0 Wrist 5th Digit 4.2 14.0 *33 >38   Motor Summary Table   Stim Site NR Onset (ms) Norm Onset (ms) O-P Amp (mV) Norm O-P Amp Site1 Site2 Delta-0 (ms) Dist (cm) Vel (m/s) Norm Vel (m/s)  Left Median Motor (Abd Poll Brev)  31.9C  Wrist    *7.1 <4.2 *0.0 >5 Elbow Wrist 6.7 22.0 *33 >50  Elbow    13.8  0.0         Left Ulnar Motor (Abd Dig Min)  32C  Wrist    3.4 <4.2 8.0 >3 B Elbow Wrist 3.6 20.0 56 >53  B Elbow    7.0  7.8  A Elbow B Elbow 1.4 10.0 71 >53  A Elbow    8.4  7.6          EMG   Side Muscle Nerve Root Ins Act Fibs Psw Amp Dur Poly Recrt Int Fraser Din Comment  Left Abd Poll Brev Median C8-T1 Nml Nml Nml Nml Nml 0 Nml Nml   Left 1stDorInt Ulnar C8-T1 Nml Nml Nml Nml Nml 0 Nml Nml     Nerve Conduction Studies Anti Sensory Left/Right Comparison   Stim Site L Lat (ms) R Lat (ms) L-R Lat (ms) L Amp (V) R Amp (V) L-R Amp (%) Site1 Site2 L Vel (m/s) R Vel (m/s) L-R Vel (m/s)  Median Acr Palm Anti Sensory (2nd Digit)  31.3C  Wrist       Wrist Palm     Palm             Radial Anti Sensory (Base 1st Digit)  32C  Wrist 2.5   16.3   Wrist Base 1st Digit     Ulnar Anti Sensory (5th Digit)  31.5C  Wrist *4.2   *7.4   Wrist 5th Digit *33     Motor Left/Right Comparison   Stim Site L Lat (ms) R Lat (ms) L-R Lat (ms) L Amp (mV) R Amp (mV) L-R Amp (%) Site1 Site2 L Vel (m/s) R Vel (m/s) L-R Vel (m/s)  Median Motor (Abd Poll Brev)  31.9C  Wrist *7.1   *0.0   Elbow Wrist *33    Elbow 13.8   0.0         Ulnar Motor (Abd Dig Min)  32C  Wrist 3.4   8.0   B Elbow Wrist 56    B Elbow 7.0   7.8   A Elbow B Elbow 71    A Elbow 8.4   7.6            Waveforms:             Clinical History: No specialty comments available.     Objective:  VS:  HT:    WT:   BMI:     BP:   HR: bpm  TEMP: ( )  RESP:  Physical Exam Musculoskeletal:        General: No swelling, tenderness or deformity.     Comments: Inspection  reveals significant atrophy of the left APB but no atrophy of the right APB or bilateral  FDI or hand intrinsics. There is no swelling, color changes, allodynia or dystrophic changes. There is 5 out of 5 strength in the bilateral wrist extension, finger abduction and long finger flexion. There is impaired sensation to light touch in the right median nerve distribution. There is a negative Hoffmann's test bilaterally.  Skin:    General: Skin is warm and dry.     Findings: No erythema or rash.  Neurological:     General: No focal deficit present.     Mental Status: She is alert and oriented to person, place, and time.     Motor: No weakness or abnormal muscle tone.     Coordination: Coordination normal.  Psychiatric:        Mood and Affect: Mood normal.        Behavior: Behavior normal.      Imaging: No results found.

## 2021-02-07 NOTE — Procedures (Signed)
EMG & NCV Findings: Evaluation of the left median motor nerve showed prolonged distal onset latency (7.1 ms), reduced amplitude (0.0 mV), and decreased conduction velocity (Elbow-Wrist, 33 m/s).  The left median (across palm) sensory nerve showed no response (Wrist) and no response (Palm).  The left ulnar sensory nerve showed prolonged distal peak latency (4.2 ms), reduced amplitude (7.4 V), and decreased conduction velocity (Wrist-5th Digit, 33 m/s).  All remaining nerves (as indicated in the following tables) were within normal limits.    All examined muscles (as indicated in the following table) showed no evidence of electrical instability.    Impression: The above electrodiagnostic study is ABNORMAL and reveals evidence of a very severe left median nerve entrapment at the wrist (carpal tunnel syndrome) affecting sensory and motor components. The lesion is characterized by sensory and motor demyelination with evidence of significant axonal injury.  Despite appropriate treatment there will likely be some permanent median nerve damage.   There is no significant electrodiagnostic evidence of any other focal nerve entrapment, brachial plexopathy or cervical radiculopathy.   Recommendations: 1.  Follow-up with referring physician. 2.  Continue current management of symptoms. 3.  Suggest surgical evaluation.  ___________________________ Laurence Spates FAAPMR Board Certified, American Board of Physical Medicine and Rehabilitation    Nerve Conduction Studies Anti Sensory Summary Table   Stim Site NR Peak (ms) Norm Peak (ms) P-T Amp (V) Norm P-T Amp Site1 Site2 Delta-P (ms) Dist (cm) Vel (m/s) Norm Vel (m/s)  Left Median Acr Palm Anti Sensory (2nd Digit)  31.3C  Wrist *NR  <3.6  >10 Wrist Palm  0.0    Palm *NR  <2.0          Left Radial Anti Sensory (Base 1st Digit)  32C  Wrist    2.5 <3.1 16.3  Wrist Base 1st Digit 2.5 0.0    Left Ulnar Anti Sensory (5th Digit)  31.5C  Wrist    *4.2 <3.7  *7.4 >15.0 Wrist 5th Digit 4.2 14.0 *33 >38   Motor Summary Table   Stim Site NR Onset (ms) Norm Onset (ms) O-P Amp (mV) Norm O-P Amp Site1 Site2 Delta-0 (ms) Dist (cm) Vel (m/s) Norm Vel (m/s)  Left Median Motor (Abd Poll Brev)  31.9C  Wrist    *7.1 <4.2 *0.0 >5 Elbow Wrist 6.7 22.0 *33 >50  Elbow    13.8  0.0         Left Ulnar Motor (Abd Dig Min)  32C  Wrist    3.4 <4.2 8.0 >3 B Elbow Wrist 3.6 20.0 56 >53  B Elbow    7.0  7.8  A Elbow B Elbow 1.4 10.0 71 >53  A Elbow    8.4  7.6          EMG   Side Muscle Nerve Root Ins Act Fibs Psw Amp Dur Poly Recrt Int Fraser Din Comment  Left Abd Poll Brev Median C8-T1 Nml Nml Nml Nml Nml 0 Nml Nml   Left 1stDorInt Ulnar C8-T1 Nml Nml Nml Nml Nml 0 Nml Nml     Nerve Conduction Studies Anti Sensory Left/Right Comparison   Stim Site L Lat (ms) R Lat (ms) L-R Lat (ms) L Amp (V) R Amp (V) L-R Amp (%) Site1 Site2 L Vel (m/s) R Vel (m/s) L-R Vel (m/s)  Median Acr Palm Anti Sensory (2nd Digit)  31.3C  Wrist       Wrist [employer]  Radial Anti Sensory (Base 1st Digit)  32C  Wrist 2.5   16.3   Wrist Base 1st Digit     Ulnar Anti Sensory (5th Digit)  31.5C  Wrist *4.2   *7.4   Wrist 5th Digit *33     Motor Left/Right Comparison   Stim Site L Lat (ms) R Lat (ms) L-R Lat (ms) L Amp (mV) R Amp (mV) L-R Amp (%) Site1 Site2 L Vel (m/s) R Vel (m/s) L-R Vel (m/s)  Median Motor (Abd Poll Brev)  31.9C  Wrist *7.1   *0.0   Elbow Wrist *33    Elbow 13.8   0.0         Ulnar Motor (Abd Dig Min)  32C  Wrist 3.4   8.0   B Elbow Wrist 56    B Elbow 7.0   7.8   A Elbow B Elbow 71    A Elbow 8.4   7.6            Waveforms:

## 2021-02-07 NOTE — Progress Notes (Signed)
Left arm numbness worse at night. Tingling. Now numbness is mostly in left hand.  Right hand dominant +Lotion

## 2021-02-10 DIAGNOSIS — N3000 Acute cystitis without hematuria: Secondary | ICD-10-CM | POA: Diagnosis not present

## 2021-02-12 DIAGNOSIS — N952 Postmenopausal atrophic vaginitis: Secondary | ICD-10-CM | POA: Diagnosis not present

## 2021-02-12 DIAGNOSIS — Z452 Encounter for adjustment and management of vascular access device: Secondary | ICD-10-CM | POA: Diagnosis not present

## 2021-02-12 DIAGNOSIS — Z792 Long term (current) use of antibiotics: Secondary | ICD-10-CM | POA: Diagnosis not present

## 2021-02-12 DIAGNOSIS — E119 Type 2 diabetes mellitus without complications: Secondary | ICD-10-CM | POA: Diagnosis not present

## 2021-02-12 DIAGNOSIS — Z87442 Personal history of urinary calculi: Secondary | ICD-10-CM | POA: Diagnosis not present

## 2021-02-12 DIAGNOSIS — K219 Gastro-esophageal reflux disease without esophagitis: Secondary | ICD-10-CM | POA: Diagnosis not present

## 2021-02-12 DIAGNOSIS — M545 Low back pain, unspecified: Secondary | ICD-10-CM | POA: Diagnosis not present

## 2021-02-12 DIAGNOSIS — H409 Unspecified glaucoma: Secondary | ICD-10-CM | POA: Diagnosis not present

## 2021-02-12 DIAGNOSIS — R911 Solitary pulmonary nodule: Secondary | ICD-10-CM | POA: Diagnosis not present

## 2021-02-12 DIAGNOSIS — N3281 Overactive bladder: Secondary | ICD-10-CM | POA: Diagnosis not present

## 2021-02-12 DIAGNOSIS — N3000 Acute cystitis without hematuria: Secondary | ICD-10-CM | POA: Diagnosis not present

## 2021-02-12 DIAGNOSIS — N1330 Unspecified hydronephrosis: Secondary | ICD-10-CM | POA: Diagnosis not present

## 2021-02-12 DIAGNOSIS — N210 Calculus in bladder: Secondary | ICD-10-CM | POA: Diagnosis not present

## 2021-02-12 DIAGNOSIS — Z7984 Long term (current) use of oral hypoglycemic drugs: Secondary | ICD-10-CM | POA: Diagnosis not present

## 2021-02-13 ENCOUNTER — Other Ambulatory Visit (HOSPITAL_COMMUNITY)
Admission: RE | Admit: 2021-02-13 | Discharge: 2021-02-13 | Disposition: A | Discharge status: Home / Self Care | Payer: Medicare Other | Source: Ambulatory Visit | Attending: Urology | Admitting: Urology

## 2021-02-13 DIAGNOSIS — Z01812 Encounter for preprocedural laboratory examination: Secondary | ICD-10-CM | POA: Diagnosis not present

## 2021-02-13 DIAGNOSIS — Z20822 Contact with and (suspected) exposure to covid-19: Secondary | ICD-10-CM | POA: Insufficient documentation

## 2021-02-13 LAB — SARS CORONAVIRUS 2 (TAT 6-24 HRS): SARS Coronavirus 2: NEGATIVE

## 2021-02-14 DIAGNOSIS — N210 Calculus in bladder: Secondary | ICD-10-CM | POA: Diagnosis not present

## 2021-02-14 DIAGNOSIS — N3000 Acute cystitis without hematuria: Secondary | ICD-10-CM | POA: Diagnosis not present

## 2021-02-17 ENCOUNTER — Ambulatory Visit (HOSPITAL_COMMUNITY): Admission: RE | Admit: 2021-02-17 | Payer: Medicare Other | Source: Home / Self Care | Admitting: Urology

## 2021-02-17 ENCOUNTER — Encounter (HOSPITAL_COMMUNITY): Admission: RE | Payer: Self-pay | Source: Home / Self Care

## 2021-02-17 SURGERY — CYSTOSCOPY, WITH BLADDER CALCULUS LITHOLAPAXY
Anesthesia: Choice

## 2021-02-18 DIAGNOSIS — Z452 Encounter for adjustment and management of vascular access device: Secondary | ICD-10-CM | POA: Diagnosis not present

## 2021-02-18 DIAGNOSIS — E119 Type 2 diabetes mellitus without complications: Secondary | ICD-10-CM | POA: Diagnosis not present

## 2021-02-18 DIAGNOSIS — N3000 Acute cystitis without hematuria: Secondary | ICD-10-CM | POA: Diagnosis not present

## 2021-02-18 DIAGNOSIS — N210 Calculus in bladder: Secondary | ICD-10-CM | POA: Diagnosis not present

## 2021-02-18 DIAGNOSIS — N1330 Unspecified hydronephrosis: Secondary | ICD-10-CM | POA: Diagnosis not present

## 2021-02-18 DIAGNOSIS — N3281 Overactive bladder: Secondary | ICD-10-CM | POA: Diagnosis not present

## 2021-02-21 DIAGNOSIS — N3281 Overactive bladder: Secondary | ICD-10-CM | POA: Diagnosis not present

## 2021-02-21 DIAGNOSIS — N3000 Acute cystitis without hematuria: Secondary | ICD-10-CM | POA: Diagnosis not present

## 2021-02-21 DIAGNOSIS — Z452 Encounter for adjustment and management of vascular access device: Secondary | ICD-10-CM | POA: Diagnosis not present

## 2021-02-21 DIAGNOSIS — N210 Calculus in bladder: Secondary | ICD-10-CM | POA: Diagnosis not present

## 2021-02-21 DIAGNOSIS — E119 Type 2 diabetes mellitus without complications: Secondary | ICD-10-CM | POA: Diagnosis not present

## 2021-02-21 DIAGNOSIS — N1330 Unspecified hydronephrosis: Secondary | ICD-10-CM | POA: Diagnosis not present

## 2021-02-24 ENCOUNTER — Ambulatory Visit: Payer: Medicare Other | Admitting: Orthopedic Surgery

## 2021-02-25 DIAGNOSIS — N3281 Overactive bladder: Secondary | ICD-10-CM | POA: Diagnosis not present

## 2021-02-25 DIAGNOSIS — Z452 Encounter for adjustment and management of vascular access device: Secondary | ICD-10-CM | POA: Diagnosis not present

## 2021-02-25 DIAGNOSIS — E119 Type 2 diabetes mellitus without complications: Secondary | ICD-10-CM | POA: Diagnosis not present

## 2021-02-25 DIAGNOSIS — N210 Calculus in bladder: Secondary | ICD-10-CM | POA: Diagnosis not present

## 2021-02-25 DIAGNOSIS — N1330 Unspecified hydronephrosis: Secondary | ICD-10-CM | POA: Diagnosis not present

## 2021-02-25 DIAGNOSIS — N3000 Acute cystitis without hematuria: Secondary | ICD-10-CM | POA: Diagnosis not present

## 2021-02-26 ENCOUNTER — Encounter: Payer: Self-pay | Admitting: Orthopedic Surgery

## 2021-02-26 ENCOUNTER — Ambulatory Visit (INDEPENDENT_AMBULATORY_CARE_PROVIDER_SITE_OTHER): Payer: Medicare Other | Admitting: Orthopedic Surgery

## 2021-02-26 DIAGNOSIS — G5602 Carpal tunnel syndrome, left upper limb: Secondary | ICD-10-CM | POA: Diagnosis not present

## 2021-02-26 DIAGNOSIS — H401132 Primary open-angle glaucoma, bilateral, moderate stage: Secondary | ICD-10-CM | POA: Diagnosis not present

## 2021-02-26 DIAGNOSIS — H18593 Other hereditary corneal dystrophies, bilateral: Secondary | ICD-10-CM | POA: Diagnosis not present

## 2021-02-26 NOTE — Progress Notes (Signed)
Office Visit Note   Patient: Dorothy Anderson           Date of Birth: 17-Mar-1941           MRN: [id number] Visit Date: 02/26/2021 Requested by: Leeroy Cha, MD [address] [place],  [place] [address] PCP: Leeroy Cha, MD  Subjective: Chief Complaint  Patient presents with  . Right Hand - Pain  . Left Hand - Pain    HPI: Dorothy Anderson is a 80 y.o. female who presents to the office complaining of bilateral hand pain primarily in the left hand.  She reports history of whole arm numbness and tingling that has now transition to primarily just left hand numbness and tingling.  Reports the entire hand goes numb and her symptoms are constant.  Cause difficulty sleeping due to the tingling.  This has been ongoing over the last 9 months to a year.  She has never had carpal tunnel surgery on the left side but she has had carpal tunnel release on the right side.  She had nerve conduction study by Dr. Ernestina Patches that revealed very severe left-sided carpal tunnel syndrome.  She reports she is ready for surgery..                ROS: All systems reviewed are negative as they relate to the chief complaint within the history of present illness.  Patient denies fevers or chills.  Assessment & Plan: Visit Diagnoses:  1. Carpal tunnel syndrome, left upper limb     Plan: Patient is a 80 year old female who returns following nerve conduction study which revealed severe left-sided carpal tunnel syndrome.  She has had surgery on her right hand for carpal tunnel syndrome.  She has thenar atrophy on examination today.  She has very severe symptoms and results from the nerve study.  Discussed options available to patient.  With her constant symptoms, recommended surgical management.  Discussed the risks and benefits of the procedure as well as the recovery time frame.  Patient and her husband understand that there is a fairly high chance that she does not have 100% symptom resolution due  to the longstanding nature of her symptoms and the severity of her symptoms.  Despite this, she would like to get as much relief as possible so she wants to proceed with surgery.  Plan to place patient for left carpal tunnel release and follow-up after procedure.  Follow-Up Instructions: No follow-ups on file.   Orders:  No orders of the defined types were placed in this encounter.  No orders of the defined types were placed in this encounter.     Procedures: No procedures performed   Clinical Data: No additional findings.  Objective: Vital Signs: There were no vitals taken for this visit.  Physical Exam:  Constitutional: Patient appears well-developed HEENT:  Head: Normocephalic Eyes:EOM are normal Neck: Normal range of motion Cardiovascular: Normal rate Pulmonary/chest: Effort normal Neurologic: Patient is alert Skin: Skin is warm Psychiatric: Patient has normal mood and affect  Ortho Exam: Ortho exam demonstrates thenar atrophy of the left hand that is asymmetric compared with the contralateral hand.  No hypothenar atrophy.  No interosseous muscle wasting or claw deformity.  There is weakness of the abductor pollicis brevis of the left hand.  Positive Phalen sign.  Negative Tinel sign.  No tenderness over the first [place], first dorsal compartment, ulnar fovea, anatomic snuffbox.  Specialty Comments:  No specialty comments available.  Imaging: No results found.  PMFS History: Patient Active Problem List   Diagnosis Date Noted  . Abnormal findings on diagnostic imaging of other specified body structures 10/26/2020  . Body mass index (BMI) 29.0-29.9, adult 10/26/2020  . Esophageal reflux 10/26/2020  . Overactive bladder 10/26/2020  . Personal history of colonic polyps 10/26/2020  . Ptosis of both eyelids 01/28/2015  . Diabetes type 2, controlled ([place]) 10/25/2014  . High blood pressure 10/25/2014  . Osteoarthritis of left knee 01/17/2013    Class: Diagnosis of  .  Osteoarthrosis involving lower leg 01/17/2013   Past Medical History:  Diagnosis Date  . Arthritis KNEES   OA  . Calculus of left kidney   . Complication of anesthesia    slow to wake up  . Diabetes mellitus without complication ([place])    PRE DIABETIC ORAL MEDS   . Frequency of urination   . GERD (gastroesophageal reflux disease)   . Glaucoma BILATERAL EYES  . History of kidney stones   . Hydronephrosis, left    DR. STEVEN DAHLSTADT  . Hyperglycemia   . Hypertension   . Nocturia   . Pneumonia   . Wears glasses     Family History  Problem Relation Age of Onset  . CVA Mother   . Heart attack Father     Past Surgical History:  Procedure Laterality Date  . CARPAL TUNNEL RELEASE  06-17-2005   RIGHT  . CATARACT EXTRACTION W/ INTRAOCULAR LENS  IMPLANT, BILATERAL    . CYSTO/ LEFT RETROGRADE PYELOGRAM/ LEFT URETERAL STENT PLACEMENT  04-15-2005   URETERAL STONE  . CYSTOSCOPY/RETROGRADE/URETEROSCOPY  01/27/2012   Procedure: CYSTOSCOPY/RETROGRADE/URETEROSCOPY;  Surgeon: Franchot Gallo, MD;  Location: [facility];  Service: Urology;  Laterality: Right;  . EXTRACORPOREAL SHOCK WAVE LITHOTRIPSY  11-09-11   RIGHT  . TONSILLECTOMY AND ADENOIDECTOMY  AGE 54  . TOTAL KNEE ARTHROPLASTY  09-29-2005   RIGHT  . TOTAL KNEE ARTHROPLASTY Left 01/17/2013   Procedure: TOTAL KNEE ARTHROPLASTY;  Surgeon: Meredith Pel, MD;  Location: [facility];  Service: Orthopedics;  Laterality: Left;  Left Total Knee Arthroplasty   Social History   Occupational History  . Not on file  Tobacco Use  . Smoking status: Never Smoker  . Smokeless tobacco: Never Used  Vaping Use  . Vaping Use: Never used  Substance and Sexual Activity  . Alcohol use: No  . Drug use: No  . Sexual activity: Not on file

## 2021-03-10 ENCOUNTER — Other Ambulatory Visit: Payer: Self-pay | Admitting: Surgical

## 2021-03-10 DIAGNOSIS — G5602 Carpal tunnel syndrome, left upper limb: Secondary | ICD-10-CM | POA: Diagnosis not present

## 2021-03-10 MED ORDER — TRAMADOL HCL 50 MG PO TABS: 50.0000 mg | ORAL_TABLET | Freq: Four times a day (QID) | ORAL | 0 refills | Status: AC | PRN

## 2021-03-10 MED ORDER — TRAMADOL HCL 50 MG PO TABS: 50.0000 mg | ORAL_TABLET | Freq: Four times a day (QID) | ORAL | 0 refills | Status: DC | PRN

## 2021-03-19 ENCOUNTER — Ambulatory Visit (INDEPENDENT_AMBULATORY_CARE_PROVIDER_SITE_OTHER): Payer: Medicare Other | Admitting: Orthopedic Surgery

## 2021-03-19 DIAGNOSIS — N3000 Acute cystitis without hematuria: Secondary | ICD-10-CM | POA: Diagnosis not present

## 2021-03-19 DIAGNOSIS — G5602 Carpal tunnel syndrome, left upper limb: Secondary | ICD-10-CM

## 2021-03-20 ENCOUNTER — Encounter: Payer: Self-pay | Admitting: Orthopedic Surgery

## 2021-03-20 NOTE — Progress Notes (Signed)
Post-Op Visit Note   Patient: Dorothy Anderson           Date of Birth: January 28, 1941           MRN: [id number] Visit Date: 03/19/2021 PCP: Leeroy Cha, MD   Assessment & Plan:  Chief Complaint:  Chief Complaint  Patient presents with  . Other     Left CTR 03/10/21   Visit Diagnoses:  1. Carpal tunnel syndrome, left upper limb     Plan: Patient is an 80 year old female who presents s/p left carpal tunnel release on 03/10/2021.  She is doing well overall.  She has not had to take any pain medication aside from 1 or 2 Tylenol since the procedure.  She has not been lifting anything since the procedure.  She still has some residual numbness and tingling but this is improved is improved compared with prior to surgery.  Sutures are intact today and the incision is healing well without any evidence of infection or dehiscence.  She is able to flex and extend all 5 fingers without difficulty.  Plan to return in 2 days for suture removal to give couple more days of incisional healing.  After that follow-up in 4 weeks.  Continue no lifting more than a pound or 2 with that hand.  Follow-Up Instructions: No follow-ups on file.   Orders:  No orders of the defined types were placed in this encounter.  No orders of the defined types were placed in this encounter.   Imaging: No results found.  PMFS History: Patient Active Problem List   Diagnosis Date Noted  . Abnormal findings on diagnostic imaging of other specified body structures 10/26/2020  . Body mass index (BMI) 29.0-29.9, adult 10/26/2020  . Esophageal reflux 10/26/2020  . Overactive bladder 10/26/2020  . Personal history of colonic polyps 10/26/2020  . Ptosis of both eyelids 01/28/2015  . Diabetes type 2, controlled ([place]) 10/25/2014  . High blood pressure 10/25/2014  . Osteoarthritis of left knee 01/17/2013    Class: Diagnosis of  . Osteoarthrosis involving lower leg 01/17/2013   Past Medical History:  Diagnosis Date   . Arthritis KNEES   OA  . Calculus of left kidney   . Complication of anesthesia    slow to wake up  . Diabetes mellitus without complication ([place])    PRE DIABETIC ORAL MEDS   . Frequency of urination   . GERD (gastroesophageal reflux disease)   . Glaucoma BILATERAL EYES  . History of kidney stones   . Hydronephrosis, left    DR. STEVEN DAHLSTADT  . Hyperglycemia   . Hypertension   . Nocturia   . Pneumonia   . Wears glasses     Family History  Problem Relation Age of Onset  . CVA Mother   . Heart attack Father     Past Surgical History:  Procedure Laterality Date  . CARPAL TUNNEL RELEASE  06-17-2005   RIGHT  . CATARACT EXTRACTION W/ INTRAOCULAR LENS  IMPLANT, BILATERAL    . CYSTO/ LEFT RETROGRADE PYELOGRAM/ LEFT URETERAL STENT PLACEMENT  04-15-2005   URETERAL STONE  . CYSTOSCOPY/RETROGRADE/URETEROSCOPY  01/27/2012   Procedure: CYSTOSCOPY/RETROGRADE/URETEROSCOPY;  Surgeon: Franchot Gallo, MD;  Location: [facility];  Service: Urology;  Laterality: Right;  . EXTRACORPOREAL SHOCK WAVE LITHOTRIPSY  11-09-11   RIGHT  . TONSILLECTOMY AND ADENOIDECTOMY  AGE 68  . TOTAL KNEE ARTHROPLASTY  09-29-2005   RIGHT  . TOTAL KNEE ARTHROPLASTY Left 01/17/2013   Procedure: TOTAL KNEE ARTHROPLASTY;  Surgeon: Meredith Pel, MD;  Location: [facility];  Service: Orthopedics;  Laterality: Left;  Left Total Knee Arthroplasty   Social History   Occupational History  . Not on file  Tobacco Use  . Smoking status: Never Smoker  . Smokeless tobacco: Never Used  Vaping Use  . Vaping Use: Never used  Substance and Sexual Activity  . Alcohol use: No  . Drug use: No  . Sexual activity: Not on file

## 2021-03-21 ENCOUNTER — Ambulatory Visit (INDEPENDENT_AMBULATORY_CARE_PROVIDER_SITE_OTHER): Payer: Medicare Other | Admitting: Orthopedic Surgery

## 2021-03-21 DIAGNOSIS — G5602 Carpal tunnel syndrome, left upper limb: Secondary | ICD-10-CM

## 2021-03-22 ENCOUNTER — Encounter: Payer: Self-pay | Admitting: Orthopedic Surgery

## 2021-03-22 NOTE — Progress Notes (Signed)
Post-Op Visit Note   Patient: Dorothy Anderson           Date of Birth: 06-12-1941           MRN: [id number] Visit Date: 03/21/2021 PCP: Leeroy Cha, MD   Assessment & Plan:  Chief Complaint:  Chief Complaint  Patient presents with  . Other    03/10/21 Left CTR post op follow up   Visit Diagnoses:  1. Carpal tunnel syndrome, left upper limb     Plan: Dorothy Anderson is an 80 year old patient is now about 10 days out left carpal tunnel release.  She has been doing well.  Numbness has improved.  Strength predictably has not yet returned.  Sutures removed today.  Continue with grip strengthening exercises but avoid heavy lifting for least the next 4 weeks.  Follow-up as needed.  She is happy with her result at this time.  Follow-Up Instructions: Return if symptoms worsen or fail to improve.   Orders:  No orders of the defined types were placed in this encounter.  No orders of the defined types were placed in this encounter.   Imaging: No results found.  PMFS History: Patient Active Problem List   Diagnosis Date Noted  . Abnormal findings on diagnostic imaging of other specified body structures 10/26/2020  . Body mass index (BMI) 29.0-29.9, adult 10/26/2020  . Esophageal reflux 10/26/2020  . Overactive bladder 10/26/2020  . Personal history of colonic polyps 10/26/2020  . Ptosis of both eyelids 01/28/2015  . Diabetes type 2, controlled ([place]) 10/25/2014  . High blood pressure 10/25/2014  . Osteoarthritis of left knee 01/17/2013    Class: Diagnosis of  . Osteoarthrosis involving lower leg 01/17/2013   Past Medical History:  Diagnosis Date  . Arthritis KNEES   OA  . Calculus of left kidney   . Complication of anesthesia    slow to wake up  . Diabetes mellitus without complication ([place])    PRE DIABETIC ORAL MEDS   . Frequency of urination   . GERD (gastroesophageal reflux disease)   . Glaucoma BILATERAL EYES  . History of kidney stones   . Hydronephrosis, left     DR. STEVEN DAHLSTADT  . Hyperglycemia   . Hypertension   . Nocturia   . Pneumonia   . Wears glasses     Family History  Problem Relation Age of Onset  . CVA Mother   . Heart attack Father     Past Surgical History:  Procedure Laterality Date  . CARPAL TUNNEL RELEASE  06-17-2005   RIGHT  . CATARACT EXTRACTION W/ INTRAOCULAR LENS  IMPLANT, BILATERAL    . CYSTO/ LEFT RETROGRADE PYELOGRAM/ LEFT URETERAL STENT PLACEMENT  04-15-2005   URETERAL STONE  . CYSTOSCOPY/RETROGRADE/URETEROSCOPY  01/27/2012   Procedure: CYSTOSCOPY/RETROGRADE/URETEROSCOPY;  Surgeon: Franchot Gallo, MD;  Location: [facility];  Service: Urology;  Laterality: Right;  . EXTRACORPOREAL SHOCK WAVE LITHOTRIPSY  11-09-11   RIGHT  . TONSILLECTOMY AND ADENOIDECTOMY  AGE 56  . TOTAL KNEE ARTHROPLASTY  09-29-2005   RIGHT  . TOTAL KNEE ARTHROPLASTY Left 01/17/2013   Procedure: TOTAL KNEE ARTHROPLASTY;  Surgeon: Meredith Pel, MD;  Location: [facility];  Service: Orthopedics;  Laterality: Left;  Left Total Knee Arthroplasty   Social History   Occupational History  . Not on file  Tobacco Use  . Smoking status: Never Smoker  . Smokeless tobacco: Never Used  Vaping Use  . Vaping Use: Never used  Substance and Sexual Activity  .  Alcohol use: No  . Drug use: No  . Sexual activity: Not on file

## 2021-04-22 ENCOUNTER — Other Ambulatory Visit: Payer: Self-pay

## 2021-04-22 ENCOUNTER — Ambulatory Visit (INDEPENDENT_AMBULATORY_CARE_PROVIDER_SITE_OTHER): Payer: Medicare Other | Admitting: Podiatry

## 2021-04-22 ENCOUNTER — Encounter: Payer: Self-pay | Admitting: Podiatry

## 2021-04-22 ENCOUNTER — Telehealth: Payer: Self-pay

## 2021-04-22 DIAGNOSIS — B351 Tinea unguium: Secondary | ICD-10-CM

## 2021-04-22 DIAGNOSIS — M8580 Other specified disorders of bone density and structure, unspecified site: Secondary | ICD-10-CM | POA: Insufficient documentation

## 2021-04-22 DIAGNOSIS — M79675 Pain in left toe(s): Secondary | ICD-10-CM

## 2021-04-22 DIAGNOSIS — M79674 Pain in right toe(s): Secondary | ICD-10-CM

## 2021-04-22 DIAGNOSIS — N182 Chronic kidney disease, stage 2 (mild): Secondary | ICD-10-CM | POA: Insufficient documentation

## 2021-04-22 DIAGNOSIS — M5136 Other intervertebral disc degeneration, lumbar region: Secondary | ICD-10-CM | POA: Insufficient documentation

## 2021-04-25 DIAGNOSIS — Z87442 Personal history of urinary calculi: Secondary | ICD-10-CM | POA: Diagnosis not present

## 2021-04-25 DIAGNOSIS — E119 Type 2 diabetes mellitus without complications: Secondary | ICD-10-CM | POA: Diagnosis not present

## 2021-04-25 DIAGNOSIS — Z789 Other specified health status: Secondary | ICD-10-CM | POA: Diagnosis not present

## 2021-04-25 DIAGNOSIS — Z7984 Long term (current) use of oral hypoglycemic drugs: Secondary | ICD-10-CM | POA: Diagnosis not present

## 2021-04-28 NOTE — Progress Notes (Signed)
Subjective: Dorothy Anderson is a pleasant 80 y.o. female patient seen today painful thick toenails that are difficult to trim. Pain interferes with ambulation. Aggravating factors include wearing enclosed shoe gear. Pain is relieved with periodic professional debridement.  Blood glucose was 133 mg/dl today.  PCP is Leeroy Cha, MD. Last visit was: 11/20/2020.  Allergies  Allergen Reactions   Amiloride Other (See Comments)    High blood level of potassium   Atorvastatin Calcium Other (See Comments)    Muscle weakness and pain   Darunavir     Other reaction(s): insomnia   Darvon Other (See Comments)    MADE PT STAY AWAKE    Emetrol     Other reaction(s): Unknown   Macrobid [Nitrofurantoin Monohydrate Macrocrystals] Nausea And Vomiting   Nitrofurantoin Nausea And Vomiting    Other reaction(s): Unknown   Propoxyphene Other (See Comments)    MADE PT STAY AWAKE    Objective: Physical Exam  General: Dorothy Anderson is a pleasant 80 y.o. Caucasian female, in NAD. AAO x 3.   Vascular:  Capillary refill time to digits immediate b/l. Faintly palpable pedal pulses b/l. Pedal hair absent. Lower extremity skin temperature gradient within normal limits. No pain with calf compression b/l.  Dermatological:  Pedal skin with normal turgor, texture and tone bilaterally. No open wounds bilaterally. No interdigital macerations bilaterally. Toenails 1-5 b/l elongated, discolored, dystrophic, thickened, crumbly with subungual debris and tenderness to dorsal palpation.  Musculoskeletal:  Normal muscle strength 5/5 to all lower extremity muscle groups bilaterally. No pain crepitus or joint limitation noted with ROM b/l. Hammertoe(s) noted to the L 2nd toe and R 2nd toe.  Neurological:  Protective sensation intact 5/5 intact bilaterally with 10g monofilament b/l. Vibratory sensation intact b/l.  Assessment and Plan:  1. Pain due to onychomycosis of toenails of both feet     -Examined  patient. -Patient to continue soft, supportive shoe gear daily. -Toenails 1-5 b/l were debrided in length and girth with sterile nail nippers and dremel without iatrogenic bleeding.  -Patient to report any pedal injuries to medical professional immediately. -Patient/POA to call should there be question/concern in the interim.  Return in about 6 months (around 10/22/2021).  Marzetta Board, DPM

## 2021-05-01 DIAGNOSIS — N182 Chronic kidney disease, stage 2 (mild): Secondary | ICD-10-CM | POA: Diagnosis not present

## 2021-05-01 DIAGNOSIS — M8580 Other specified disorders of bone density and structure, unspecified site: Secondary | ICD-10-CM | POA: Diagnosis not present

## 2021-05-01 DIAGNOSIS — Z6828 Body mass index (BMI) 28.0-28.9, adult: Secondary | ICD-10-CM | POA: Diagnosis not present

## 2021-05-01 DIAGNOSIS — M5136 Other intervertebral disc degeneration, lumbar region: Secondary | ICD-10-CM | POA: Diagnosis not present

## 2021-05-01 DIAGNOSIS — M150 Primary generalized (osteo)arthritis: Secondary | ICD-10-CM | POA: Diagnosis not present

## 2021-05-01 DIAGNOSIS — E663 Overweight: Secondary | ICD-10-CM | POA: Diagnosis not present

## 2021-06-02 NOTE — Telephone Encounter (Signed)
Error

## 2021-06-06 DIAGNOSIS — E1169 Type 2 diabetes mellitus with other specified complication: Secondary | ICD-10-CM | POA: Diagnosis not present

## 2021-06-06 DIAGNOSIS — I10 Essential (primary) hypertension: Secondary | ICD-10-CM | POA: Diagnosis not present

## 2021-06-06 DIAGNOSIS — Z87442 Personal history of urinary calculi: Secondary | ICD-10-CM | POA: Diagnosis not present

## 2021-07-02 DIAGNOSIS — H401132 Primary open-angle glaucoma, bilateral, moderate stage: Secondary | ICD-10-CM | POA: Diagnosis not present

## 2021-07-02 DIAGNOSIS — H18593 Other hereditary corneal dystrophies, bilateral: Secondary | ICD-10-CM | POA: Diagnosis not present

## 2021-07-02 DIAGNOSIS — H04123 Dry eye syndrome of bilateral lacrimal glands: Secondary | ICD-10-CM | POA: Diagnosis not present

## 2021-07-30 ENCOUNTER — Ambulatory Visit: Payer: Medicare Other | Admitting: Podiatry

## 2021-07-30 DIAGNOSIS — Z23 Encounter for immunization: Secondary | ICD-10-CM | POA: Diagnosis not present

## 2021-08-05 DIAGNOSIS — M5136 Other intervertebral disc degeneration, lumbar region: Secondary | ICD-10-CM | POA: Diagnosis not present

## 2021-08-05 DIAGNOSIS — Z6827 Body mass index (BMI) 27.0-27.9, adult: Secondary | ICD-10-CM | POA: Diagnosis not present

## 2021-08-05 DIAGNOSIS — M150 Primary generalized (osteo)arthritis: Secondary | ICD-10-CM | POA: Diagnosis not present

## 2021-08-05 DIAGNOSIS — N182 Chronic kidney disease, stage 2 (mild): Secondary | ICD-10-CM | POA: Diagnosis not present

## 2021-08-05 DIAGNOSIS — M8580 Other specified disorders of bone density and structure, unspecified site: Secondary | ICD-10-CM | POA: Diagnosis not present

## 2021-08-05 DIAGNOSIS — E663 Overweight: Secondary | ICD-10-CM | POA: Diagnosis not present

## 2021-08-05 DIAGNOSIS — Z23 Encounter for immunization: Secondary | ICD-10-CM | POA: Diagnosis not present

## 2021-08-15 DIAGNOSIS — Z1231 Encounter for screening mammogram for malignant neoplasm of breast: Secondary | ICD-10-CM | POA: Diagnosis not present

## 2021-09-22 DIAGNOSIS — N183 Chronic kidney disease, stage 3 unspecified: Secondary | ICD-10-CM | POA: Diagnosis not present

## 2021-09-29 DIAGNOSIS — N182 Chronic kidney disease, stage 2 (mild): Secondary | ICD-10-CM | POA: Diagnosis not present

## 2021-09-29 DIAGNOSIS — E559 Vitamin D deficiency, unspecified: Secondary | ICD-10-CM | POA: Diagnosis not present

## 2021-09-29 DIAGNOSIS — I129 Hypertensive chronic kidney disease with stage 1 through stage 4 chronic kidney disease, or unspecified chronic kidney disease: Secondary | ICD-10-CM | POA: Diagnosis not present

## 2021-09-29 DIAGNOSIS — Z87442 Personal history of urinary calculi: Secondary | ICD-10-CM | POA: Diagnosis not present

## 2021-09-29 DIAGNOSIS — E875 Hyperkalemia: Secondary | ICD-10-CM | POA: Diagnosis not present

## 2021-10-13 DIAGNOSIS — L820 Inflamed seborrheic keratosis: Secondary | ICD-10-CM | POA: Diagnosis not present

## 2021-10-13 DIAGNOSIS — Z789 Other specified health status: Secondary | ICD-10-CM | POA: Diagnosis not present

## 2021-10-13 DIAGNOSIS — Z87442 Personal history of urinary calculi: Secondary | ICD-10-CM | POA: Diagnosis not present

## 2021-10-13 DIAGNOSIS — Z7984 Long term (current) use of oral hypoglycemic drugs: Secondary | ICD-10-CM | POA: Diagnosis not present

## 2021-10-13 DIAGNOSIS — E119 Type 2 diabetes mellitus without complications: Secondary | ICD-10-CM | POA: Diagnosis not present

## 2021-10-21 DIAGNOSIS — K219 Gastro-esophageal reflux disease without esophagitis: Secondary | ICD-10-CM | POA: Diagnosis not present

## 2021-10-21 DIAGNOSIS — I10 Essential (primary) hypertension: Secondary | ICD-10-CM | POA: Diagnosis not present

## 2021-10-21 DIAGNOSIS — M1990 Unspecified osteoarthritis, unspecified site: Secondary | ICD-10-CM | POA: Diagnosis not present

## 2021-10-21 DIAGNOSIS — E119 Type 2 diabetes mellitus without complications: Secondary | ICD-10-CM | POA: Diagnosis not present

## 2021-10-21 DIAGNOSIS — E1169 Type 2 diabetes mellitus with other specified complication: Secondary | ICD-10-CM | POA: Diagnosis not present

## 2021-10-22 ENCOUNTER — Other Ambulatory Visit: Payer: Self-pay

## 2021-10-22 ENCOUNTER — Ambulatory Visit (INDEPENDENT_AMBULATORY_CARE_PROVIDER_SITE_OTHER): Payer: Medicare Other | Admitting: Podiatry

## 2021-10-22 ENCOUNTER — Encounter: Payer: Self-pay | Admitting: Podiatry

## 2021-10-22 DIAGNOSIS — B351 Tinea unguium: Secondary | ICD-10-CM | POA: Diagnosis not present

## 2021-10-22 DIAGNOSIS — E119 Type 2 diabetes mellitus without complications: Secondary | ICD-10-CM | POA: Diagnosis not present

## 2021-10-22 DIAGNOSIS — M79675 Pain in left toe(s): Secondary | ICD-10-CM | POA: Diagnosis not present

## 2021-10-22 DIAGNOSIS — M2041 Other hammer toe(s) (acquired), right foot: Secondary | ICD-10-CM

## 2021-10-22 DIAGNOSIS — M79674 Pain in right toe(s): Secondary | ICD-10-CM | POA: Diagnosis not present

## 2021-10-22 DIAGNOSIS — M2042 Other hammer toe(s) (acquired), left foot: Secondary | ICD-10-CM

## 2021-10-22 NOTE — Progress Notes (Signed)
ANNUAL DIABETIC FOOT EXAM  Subjective: Dorothy Anderson presents today for for annual diabetic foot examination and painful elongated mycotic toenails 1-5 bilaterally which are tender when wearing enclosed shoe gear. Pain is relieved with periodic professional debridement..  Patient relates 3 year h/o diabetes.  Patient denies any h/o foot wounds.  Patient denies any symptoms of foot numbness.  Patient endorses  symptoms of foot tingling.  Patient denies symptoms of burning in feet.  Patient denies symptoms of pins/needles in feet.  Patient relates her feet feel cold.  Patient's blood sugar was 118 mg/dl on yesterday. Patient did not check blood glucose this morning.  Leeroy Cha, MD is patient's PCP. Last visit was 07/07/2021.  Past Medical History:  Diagnosis Date   Arthritis KNEES   OA   Calculus of left kidney    Complication of anesthesia    slow to wake up   Diabetes mellitus without complication ([facility])    PRE DIABETIC ORAL MEDS    Frequency of urination    GERD (gastroesophageal reflux disease)    Glaucoma BILATERAL EYES   History of kidney stones    Hydronephrosis, left    DR. STEVEN DAHLSTADT   Hyperglycemia    Hypertension    Nocturia    Pneumonia    Wears glasses    Patient Active Problem List   Diagnosis Date Noted   Chronic kidney disease, stage 2 (mild) 04/22/2021   Degeneration of lumbar intervertebral disc 04/22/2021   Osteopenia 04/22/2021   Abnormal findings on diagnostic imaging of other specified body structures 10/26/2020   Body mass index (BMI) 29.0-29.9, adult 10/26/2020   Esophageal reflux 10/26/2020   Overactive bladder 10/26/2020   Personal history of colonic polyps 10/26/2020   Ptosis of both eyelids 01/28/2015   Diabetes type 2, controlled ([place]) 10/25/2014   High blood pressure 10/25/2014   Osteoarthritis of left knee 01/17/2013    Class: Diagnosis of   Osteoarthrosis involving lower leg 01/17/2013   Past Surgical  History:  Procedure Laterality Date   CARPAL TUNNEL RELEASE  06-17-2005   RIGHT   CATARACT EXTRACTION W/ INTRAOCULAR LENS  IMPLANT, BILATERAL     CYSTO/ LEFT RETROGRADE PYELOGRAM/ LEFT URETERAL STENT PLACEMENT  04-15-2005   URETERAL STONE   CYSTOSCOPY/RETROGRADE/URETEROSCOPY  01/27/2012   Procedure: CYSTOSCOPY/RETROGRADE/URETEROSCOPY;  Surgeon: Franchot Gallo, MD;  Location: [facility];  Service: Urology;  Laterality: Right;   EXTRACORPOREAL SHOCK WAVE LITHOTRIPSY  11-09-11   RIGHT   TONSILLECTOMY AND ADENOIDECTOMY  AGE 7   TOTAL KNEE ARTHROPLASTY  09-29-2005   RIGHT   TOTAL KNEE ARTHROPLASTY Left 01/17/2013   Procedure: TOTAL KNEE ARTHROPLASTY;  Surgeon: Meredith Pel, MD;  Location: [facility];  Service: Orthopedics;  Laterality: Left;  Left Total Knee Arthroplasty   Current Outpatient Medications on File Prior to Visit  Medication Sig Dispense Refill   aspirin (ASPIRIN 81) 81 MG EC tablet 1 tablet     brimonidine (ALPHAGAN) 0.2 % ophthalmic solution Place 1 drop into both eyes 2 (two) times a day.      brimonidine (ALPHAGAN) 0.2 % ophthalmic solution 1 drop into both eyes     Brimonidine-Dorzolamide 0.15-2 % SOLN See admin instructions.     calcium carbonate (SUPER CALCIUM) 1500 (600 Ca) MG TABS tablet 1 tablet with meals     CALCIUM-VITAMIN D PO Take 1 tablet by mouth daily.     cephALEXin (KEFLEX) 500 MG capsule Take 500 mg by mouth 2 (two) times daily.  cholecalciferol (VITAMIN D) 1000 UNITS tablet Take 1,000 Units by mouth daily.     cholecalciferol (VITAMIN D3) 25 MCG (1000 UNIT) tablet 1 tablet     docusate sodium (COLACE) 100 MG capsule Take 100 mg by mouth 2 (two) times daily as needed for moderate constipation.     doxycycline (VIBRA-TABS) 100 MG tablet Take 100 mg by mouth 2 (two) times daily.     DULoxetine (CYMBALTA) 30 MG capsule Take 30 mg by mouth daily.      DULoxetine (CYMBALTA) 30 MG capsule Take 1 capsule by mouth daily.     famotidine  (PEPCID) 20 MG tablet Take 20 mg by mouth See admin instructions. At bedtime every other night - alternating with Prevacid     famotidine (PEPCID) 40 MG tablet 1 tablet at bedtime.     HYDROcodone-acetaminophen (NORCO/VICODIN) 5-325 MG tablet 1 capsule as needed for pain     Lancets (ONETOUCH DELICA PLUS [id number]) MISC U 1 LANCET ONCE A DAY     Lansoprazole (PREVACID PO) 1 capsule before a meal     lansoprazole (PREVACID) 15 MG capsule Take 15 mg by mouth See admin instructions. Every other day twice daily - alternating with famotidine     latanoprost (XALATAN) 0.005 % ophthalmic solution Place 1 drop into both eyes at bedtime.     latanoprost (XALATAN) 0.005 % ophthalmic solution 1 drop into affected eye in the evening     latanoprost (XALATAN) 0.005 % ophthalmic solution 1 drop into both eyes in the evening     metFORMIN (GLUCOPHAGE) 500 MG tablet 2 tablet with a meal     metFORMIN (GLUCOPHAGE-XR) 500 MG 24 hr tablet Take 1,000 mg by mouth daily with breakfast.      metoprolol tartrate (LOPRESSOR) 25 MG tablet Take 1 tablet by mouth 2 (two) times daily with a meal.     Multiple Vitamin (MULTIVITAMIN ADULT) TABS      Multiple Vitamins-Minerals (MULTIVITAMIN ADULTS) TABS 1 tablet     nirmatrelvir & ritonavir (PAXLOVID) 20 x 150 MG & 10 x 100MG  TBPK Take by mouth.     ONETOUCH VERIO test strip U ONCE A DAY     oxybutynin (DITROPAN) 5 MG tablet 1 tablet     oxybutynin (DITROPAN-XL) 5 MG 24 hr tablet Take 5 mg by mouth in the morning and at bedtime.     pravastatin (PRAVACHOL) 10 MG tablet Take 1 tablet by mouth at bedtime.     traMADol (ULTRAM) 50 MG tablet Take 1 tablet (50 mg total) by mouth every 6 (six) hours as needed. 20 tablet 0   No current facility-administered medications on file prior to visit.    Allergies  Allergen Reactions   Amiloride Other (See Comments)    High blood level of potassium   Atorvastatin     Other reaction(s): muscle weakness and pain   Atorvastatin Calcium  Other (See Comments)    Muscle weakness and pain   Darunavir     Other reaction(s): insomnia   Darvon Other (See Comments)    MADE PT STAY AWAKE    Emetrol     Other reaction(s): Unknown   Macrobid [Nitrofurantoin Monohydrate Macrocrystals] Nausea And Vomiting   Nitrofurantoin Nausea And Vomiting    Other reaction(s): Unknown Other reaction(s): nausea   Propoxyphene Other (See Comments)    MADE PT STAY AWAKE   Social History   Occupational History   Not on file  Tobacco Use   Smoking status: Never  Smokeless tobacco: Never  Vaping Use   Vaping Use: Never used  Substance and Sexual Activity   Alcohol use: No   Drug use: No   Sexual activity: Not on file   Family History  Problem Relation Age of Onset   CVA Mother    Heart attack Father    Immunization History  Administered Date(s) Administered   Influenza-Unspecified 08/09/2013   Zoster Recombinat (Shingrix) 11/25/2018     Review of Systems: Negative except as noted in the HPI.   Objective: There were no vitals filed for this visit.  ROWENE SUTO is a pleasant 80 y.o. female in NAD. AAO X 3.  Vascular Examination: Capillary refill time to digits immediate b/l. Palpable DP pulse(s) b/l LE. Faintly palpable PT pulse(s) b/l LE. Pedal hair absent. No pain with calf compression b/l. Varicosities present b/l. No cyanosis or clubbing noted b/l LE.  Dermatological Examination: Pedal integument with normal turgor, texture and tone BLE. No open wounds b/l LE. No interdigital macerations noted b/l LE. Toenails 1-5 bilaterally elongated, discolored, dystrophic, thickened, and crumbly with subungual debris and tenderness to dorsal palpation.  Musculoskeletal Examination: Muscle strength 5/5 to all lower extremity muscle groups bilaterally. Hammertoe deformity noted 2-5 b/l.  Footwear Assessment: Does the patient wear appropriate shoes? Yes. Does the patient need inserts/orthotics? No..  Neurological  Examination: Protective sensation intact 5/5 intact bilaterally with 10g monofilament b/l. Vibratory sensation intact b/l.  Assessment: 1. Pain due to onychomycosis of toenails of both feet   2. Hammer toes of both feet   3. Controlled type 2 diabetes mellitus without complication, without long-term current use of insulin ([place])   4. Encounter for diabetic foot exam ([place])     ADA Risk Categorization: Low Risk :  Patient has all of the following: Intact protective sensation No prior foot ulcer  No severe deformity Pedal pulses present  Plan: -Diabetic foot examination performed today. -Continue foot and shoe inspections daily. Monitor blood glucose per PCP/Endocrinologist's recommendations. -Mycotic toenails 1-5 bilaterally were debrided in length and girth with sterile nail nippers and dremel without incident. -Patient/POA to call should there be question/concern in the interim.  Return in about 3 months (around 01/20/2022).  Marzetta Board, DPM

## 2021-11-11 DIAGNOSIS — E663 Overweight: Secondary | ICD-10-CM | POA: Diagnosis not present

## 2021-11-11 DIAGNOSIS — M8580 Other specified disorders of bone density and structure, unspecified site: Secondary | ICD-10-CM | POA: Diagnosis not present

## 2021-11-11 DIAGNOSIS — M5136 Other intervertebral disc degeneration, lumbar region: Secondary | ICD-10-CM | POA: Diagnosis not present

## 2021-11-11 DIAGNOSIS — M150 Primary generalized (osteo)arthritis: Secondary | ICD-10-CM | POA: Diagnosis not present

## 2021-11-11 DIAGNOSIS — N182 Chronic kidney disease, stage 2 (mild): Secondary | ICD-10-CM | POA: Diagnosis not present

## 2021-11-11 DIAGNOSIS — Z6827 Body mass index (BMI) 27.0-27.9, adult: Secondary | ICD-10-CM | POA: Diagnosis not present

## 2021-11-19 DIAGNOSIS — H18593 Other hereditary corneal dystrophies, bilateral: Secondary | ICD-10-CM | POA: Diagnosis not present

## 2021-11-19 DIAGNOSIS — Z961 Presence of intraocular lens: Secondary | ICD-10-CM | POA: Diagnosis not present

## 2021-11-19 DIAGNOSIS — H04123 Dry eye syndrome of bilateral lacrimal glands: Secondary | ICD-10-CM | POA: Diagnosis not present

## 2021-11-19 DIAGNOSIS — H401132 Primary open-angle glaucoma, bilateral, moderate stage: Secondary | ICD-10-CM | POA: Diagnosis not present

## 2021-11-25 DIAGNOSIS — Z7984 Long term (current) use of oral hypoglycemic drugs: Secondary | ICD-10-CM | POA: Diagnosis not present

## 2021-11-25 DIAGNOSIS — Z23 Encounter for immunization: Secondary | ICD-10-CM | POA: Diagnosis not present

## 2021-11-25 DIAGNOSIS — K219 Gastro-esophageal reflux disease without esophagitis: Secondary | ICD-10-CM | POA: Diagnosis not present

## 2021-11-25 DIAGNOSIS — Z0000 Encounter for general adult medical examination without abnormal findings: Secondary | ICD-10-CM | POA: Diagnosis not present

## 2021-11-25 DIAGNOSIS — E1169 Type 2 diabetes mellitus with other specified complication: Secondary | ICD-10-CM | POA: Diagnosis not present

## 2021-11-25 DIAGNOSIS — Z1389 Encounter for screening for other disorder: Secondary | ICD-10-CM | POA: Diagnosis not present

## 2021-11-25 DIAGNOSIS — Z7189 Other specified counseling: Secondary | ICD-10-CM | POA: Diagnosis not present

## 2021-11-25 DIAGNOSIS — N3281 Overactive bladder: Secondary | ICD-10-CM | POA: Diagnosis not present

## 2021-11-25 DIAGNOSIS — I10 Essential (primary) hypertension: Secondary | ICD-10-CM | POA: Diagnosis not present

## 2021-12-31 DIAGNOSIS — K219 Gastro-esophageal reflux disease without esophagitis: Secondary | ICD-10-CM | POA: Diagnosis not present

## 2022-02-25 DIAGNOSIS — M1991 Primary osteoarthritis, unspecified site: Secondary | ICD-10-CM | POA: Diagnosis not present

## 2022-02-25 DIAGNOSIS — M8580 Other specified disorders of bone density and structure, unspecified site: Secondary | ICD-10-CM | POA: Diagnosis not present

## 2022-02-25 DIAGNOSIS — N182 Chronic kidney disease, stage 2 (mild): Secondary | ICD-10-CM | POA: Diagnosis not present

## 2022-02-25 DIAGNOSIS — E663 Overweight: Secondary | ICD-10-CM | POA: Diagnosis not present

## 2022-02-25 DIAGNOSIS — Z6827 Body mass index (BMI) 27.0-27.9, adult: Secondary | ICD-10-CM | POA: Diagnosis not present

## 2022-02-25 DIAGNOSIS — M5136 Other intervertebral disc degeneration, lumbar region: Secondary | ICD-10-CM | POA: Diagnosis not present

## 2022-03-18 DIAGNOSIS — H35373 Puckering of macula, bilateral: Secondary | ICD-10-CM | POA: Diagnosis not present

## 2022-03-18 DIAGNOSIS — H18593 Other hereditary corneal dystrophies, bilateral: Secondary | ICD-10-CM | POA: Diagnosis not present

## 2022-03-18 DIAGNOSIS — H401132 Primary open-angle glaucoma, bilateral, moderate stage: Secondary | ICD-10-CM | POA: Diagnosis not present

## 2022-03-18 DIAGNOSIS — H04123 Dry eye syndrome of bilateral lacrimal glands: Secondary | ICD-10-CM | POA: Diagnosis not present

## 2022-04-14 DIAGNOSIS — E119 Type 2 diabetes mellitus without complications: Secondary | ICD-10-CM | POA: Diagnosis not present

## 2022-04-22 ENCOUNTER — Ambulatory Visit (INDEPENDENT_AMBULATORY_CARE_PROVIDER_SITE_OTHER): Payer: Medicare Other | Admitting: Podiatry

## 2022-04-22 ENCOUNTER — Encounter: Payer: Self-pay | Admitting: Podiatry

## 2022-04-22 DIAGNOSIS — E119 Type 2 diabetes mellitus without complications: Secondary | ICD-10-CM | POA: Diagnosis not present

## 2022-04-22 DIAGNOSIS — M79675 Pain in left toe(s): Secondary | ICD-10-CM | POA: Diagnosis not present

## 2022-04-22 DIAGNOSIS — M79674 Pain in right toe(s): Secondary | ICD-10-CM | POA: Diagnosis not present

## 2022-04-22 DIAGNOSIS — B351 Tinea unguium: Secondary | ICD-10-CM | POA: Diagnosis not present

## 2022-04-27 NOTE — Progress Notes (Signed)
  Subjective:  Patient ID: Dorothy Anderson, female    DOB: Jan 27, 1941,  MRN: [id number]  Dorothy Anderson presents to clinic today for preventative diabetic foot care and painful thick toenails that are difficult to trim. Pain interferes with ambulation. Aggravating factors include wearing enclosed shoe gear. Pain is relieved with periodic professional debridement.  Patient states blood glucose was 128 mg/dl yesterday.  Last known HgA1c was 6.8%.  New problem(s): None.   PCP is Leeroy Cha, MD , and last visit was December 31, 2021.  Allergies  Allergen Reactions   Amiloride Other (See Comments)    High blood level of potassium   Atorvastatin     Other reaction(s): muscle weakness and pain   Atorvastatin Calcium Other (See Comments)    Muscle weakness and pain   Darunavir     Other reaction(s): insomnia   Darvon Other (See Comments)    MADE PT STAY AWAKE    Emetrol     Other reaction(s): Unknown   Macrobid [Nitrofurantoin Monohydrate Macrocrystals] Nausea And Vomiting   Nitrofurantoin Nausea And Vomiting    Other reaction(s): Unknown Other reaction(s): nausea   Propoxyphene Other (See Comments)    MADE PT STAY AWAKE    Review of Systems: Negative except as noted in the HPI.  Objective: No changes noted in today's physical examination. Dorothy Anderson is a pleasant 81 y.o. female in NAD. AAO X 3.  Vascular Examination: Capillary refill time to digits immediate b/l. Palpable DP pulse(s) b/l LE. Faintly palpable PT pulse(s) b/l LE. Pedal hair absent. No pain with calf compression b/l. Varicosities present b/l. No cyanosis or clubbing noted b/l LE.  Dermatological Examination: Pedal integument with normal turgor, texture and tone BLE. No open wounds b/l LE. No interdigital macerations noted b/l LE. Toenails 1-5 bilaterally elongated, discolored, dystrophic, thickened, and crumbly with subungual debris and tenderness to dorsal palpation.  Musculoskeletal  Examination: Muscle strength 5/5 to all lower extremity muscle groups bilaterally. Hammertoe deformity noted 2-5 b/l.  Neurological Examination: Protective sensation intact 5/5 intact bilaterally with 10g monofilament b/l. Vibratory sensation intact b/l.  Assessment/Plan: 1. Pain due to onychomycosis of toenails of both feet   2. Controlled type 2 diabetes mellitus without complication, without long-term current use of insulin ([place])    -Patient was evaluated and treated. All patient's and/or POA's questions/concerns answered on today's visit. -Continue diabetic foot care principles: inspect feet daily, monitor glucose as recommended by PCP and/or Endocrinologist, and follow prescribed diet per PCP, Endocrinologist and/or dietician. -Toenails 1-5 b/l were debrided in length and girth with sterile nail nippers and dremel without iatrogenic bleeding.  -Follow up November, 1, 2023, per patient request. -Patient/POA to call should there be question/concern in the interim.   Return in about 20 weeks (around 09/09/2022).  Marzetta Board, DPM

## 2022-05-08 DIAGNOSIS — N3281 Overactive bladder: Secondary | ICD-10-CM | POA: Diagnosis not present

## 2022-05-08 DIAGNOSIS — N3020 Other chronic cystitis without hematuria: Secondary | ICD-10-CM | POA: Diagnosis not present

## 2022-05-27 DIAGNOSIS — M1991 Primary osteoarthritis, unspecified site: Secondary | ICD-10-CM | POA: Diagnosis not present

## 2022-05-27 DIAGNOSIS — G894 Chronic pain syndrome: Secondary | ICD-10-CM | POA: Diagnosis not present

## 2022-05-27 DIAGNOSIS — N182 Chronic kidney disease, stage 2 (mild): Secondary | ICD-10-CM | POA: Diagnosis not present

## 2022-05-27 DIAGNOSIS — E663 Overweight: Secondary | ICD-10-CM | POA: Diagnosis not present

## 2022-05-27 DIAGNOSIS — M1990 Unspecified osteoarthritis, unspecified site: Secondary | ICD-10-CM | POA: Diagnosis not present

## 2022-05-27 DIAGNOSIS — I10 Essential (primary) hypertension: Secondary | ICD-10-CM | POA: Diagnosis not present

## 2022-05-27 DIAGNOSIS — E1169 Type 2 diabetes mellitus with other specified complication: Secondary | ICD-10-CM | POA: Diagnosis not present

## 2022-05-27 DIAGNOSIS — Z6828 Body mass index (BMI) 28.0-28.9, adult: Secondary | ICD-10-CM | POA: Diagnosis not present

## 2022-05-27 DIAGNOSIS — K219 Gastro-esophageal reflux disease without esophagitis: Secondary | ICD-10-CM | POA: Diagnosis not present

## 2022-05-27 DIAGNOSIS — M8580 Other specified disorders of bone density and structure, unspecified site: Secondary | ICD-10-CM | POA: Diagnosis not present

## 2022-05-27 DIAGNOSIS — N3281 Overactive bladder: Secondary | ICD-10-CM | POA: Diagnosis not present

## 2022-05-27 DIAGNOSIS — M5136 Other intervertebral disc degeneration, lumbar region: Secondary | ICD-10-CM | POA: Diagnosis not present

## 2022-06-26 DIAGNOSIS — F32A Depression, unspecified: Secondary | ICD-10-CM | POA: Diagnosis not present

## 2022-06-26 DIAGNOSIS — I10 Essential (primary) hypertension: Secondary | ICD-10-CM | POA: Diagnosis not present

## 2022-06-26 DIAGNOSIS — M1990 Unspecified osteoarthritis, unspecified site: Secondary | ICD-10-CM | POA: Diagnosis not present

## 2022-08-17 DIAGNOSIS — Z23 Encounter for immunization: Secondary | ICD-10-CM | POA: Diagnosis not present

## 2022-08-17 DIAGNOSIS — H04123 Dry eye syndrome of bilateral lacrimal glands: Secondary | ICD-10-CM | POA: Diagnosis not present

## 2022-08-17 DIAGNOSIS — H401132 Primary open-angle glaucoma, bilateral, moderate stage: Secondary | ICD-10-CM | POA: Diagnosis not present

## 2022-08-17 DIAGNOSIS — H18593 Other hereditary corneal dystrophies, bilateral: Secondary | ICD-10-CM | POA: Diagnosis not present

## 2022-08-27 DIAGNOSIS — Z23 Encounter for immunization: Secondary | ICD-10-CM | POA: Diagnosis not present

## 2022-08-27 DIAGNOSIS — Z87440 Personal history of urinary (tract) infections: Secondary | ICD-10-CM | POA: Diagnosis not present

## 2022-08-27 DIAGNOSIS — Z1231 Encounter for screening mammogram for malignant neoplasm of breast: Secondary | ICD-10-CM | POA: Diagnosis not present

## 2022-08-27 DIAGNOSIS — Z01419 Encounter for gynecological examination (general) (routine) without abnormal findings: Secondary | ICD-10-CM | POA: Diagnosis not present

## 2022-09-02 DIAGNOSIS — G894 Chronic pain syndrome: Secondary | ICD-10-CM | POA: Diagnosis not present

## 2022-09-02 DIAGNOSIS — F32A Depression, unspecified: Secondary | ICD-10-CM | POA: Diagnosis not present

## 2022-09-02 DIAGNOSIS — E1169 Type 2 diabetes mellitus with other specified complication: Secondary | ICD-10-CM | POA: Diagnosis not present

## 2022-09-02 DIAGNOSIS — N183 Chronic kidney disease, stage 3 unspecified: Secondary | ICD-10-CM | POA: Diagnosis not present

## 2022-09-02 DIAGNOSIS — I10 Essential (primary) hypertension: Secondary | ICD-10-CM | POA: Diagnosis not present

## 2022-09-08 DIAGNOSIS — Z87442 Personal history of urinary calculi: Secondary | ICD-10-CM | POA: Diagnosis not present

## 2022-09-08 DIAGNOSIS — E559 Vitamin D deficiency, unspecified: Secondary | ICD-10-CM | POA: Diagnosis not present

## 2022-09-08 DIAGNOSIS — E1122 Type 2 diabetes mellitus with diabetic chronic kidney disease: Secondary | ICD-10-CM | POA: Diagnosis not present

## 2022-09-08 DIAGNOSIS — N182 Chronic kidney disease, stage 2 (mild): Secondary | ICD-10-CM | POA: Diagnosis not present

## 2022-09-08 DIAGNOSIS — E875 Hyperkalemia: Secondary | ICD-10-CM | POA: Diagnosis not present

## 2022-09-08 DIAGNOSIS — I129 Hypertensive chronic kidney disease with stage 1 through stage 4 chronic kidney disease, or unspecified chronic kidney disease: Secondary | ICD-10-CM | POA: Diagnosis not present

## 2022-09-11 ENCOUNTER — Ambulatory Visit (INDEPENDENT_AMBULATORY_CARE_PROVIDER_SITE_OTHER): Payer: Medicare Other | Admitting: Podiatry

## 2022-09-11 DIAGNOSIS — E119 Type 2 diabetes mellitus without complications: Secondary | ICD-10-CM | POA: Diagnosis not present

## 2022-09-11 DIAGNOSIS — M79675 Pain in left toe(s): Secondary | ICD-10-CM

## 2022-09-11 DIAGNOSIS — M79674 Pain in right toe(s): Secondary | ICD-10-CM

## 2022-09-11 DIAGNOSIS — B351 Tinea unguium: Secondary | ICD-10-CM | POA: Diagnosis not present

## 2022-09-15 ENCOUNTER — Encounter: Payer: Self-pay | Admitting: Podiatry

## 2022-09-15 NOTE — Progress Notes (Signed)
  Subjective:  Patient ID: Dorothy Anderson, female    DOB: 04/30/1941,  MRN: [id number]  Dorothy Anderson presents to clinic today for preventative diabetic foot care and painful elongated mycotic toenails 1-5 bilaterally which are tender when wearing enclosed shoe gear. Pain is relieved with periodic professional debridement.  Chief Complaint  Patient presents with   routine foot care    New problem(s): None.   PCP is Leeroy Cha, MD , and last visit was December 31, 2021.  Allergies  Allergen Reactions   Amiloride Other (See Comments)    High blood level of potassium   Atorvastatin     Other reaction(s): muscle weakness and pain   Atorvastatin Calcium Other (See Comments)    Muscle weakness and pain   Darunavir     Other reaction(s): insomnia   Darvon Other (See Comments)    MADE PT STAY AWAKE    Emetrol     Other reaction(s): Unknown   Macrobid [Nitrofurantoin Monohydrate Macrocrystals] Nausea And Vomiting   Nitrofurantoin Nausea And Vomiting    Other reaction(s): Unknown Other reaction(s): nausea   Propoxyphene Other (See Comments)    MADE PT STAY AWAKE    Review of Systems: Negative except as noted in the HPI.  Objective: No changes noted in today's physical examination.  [name] is a pleasant 81 y.o. female in NAD. AAO x 3.  Vascular Examination: Capillary refill time to digits immediate b/l. Palpable DP pulse(s) b/l LE. Faintly palpable PT pulse(s) b/l LE. Pedal hair absent. No pain with calf compression b/l. Varicosities present b/l. No cyanosis or clubbing noted b/l LE.  Dermatological Examination: Pedal integument with normal turgor, texture and tone BLE. No open wounds b/l LE. No interdigital macerations noted b/l LE. Toenails 1-5 bilaterally elongated, discolored, dystrophic, thickened, and crumbly with subungual debris and tenderness to dorsal palpation.  Musculoskeletal Examination: Muscle strength 5/5 to all lower extremity muscle groups  bilaterally. Hammertoe deformity noted 2-5 b/l.  Neurological Examination: Protective sensation intact 5/5 intact bilaterally with 10g monofilament b/l. Vibratory sensation intact b/l.  Assessment/Plan: 1. Pain due to onychomycosis of toenails of both feet   2. Controlled type 2 diabetes mellitus without complication, without long-term current use of insulin ([place])     No orders of the defined types were placed in this encounter.   -Consent given for treatment as described below: -Examined patient. -Patient to continue soft, supportive shoe gear daily. -Mycotic toenails 1-5 bilaterally were debrided in length and girth with sterile nail nippers and dremel without incident. -Patient/POA to call should there be question/concern in the interim.   Return in about 4 months (around 01/10/2023).  Marzetta Board, DPM

## 2022-10-15 DIAGNOSIS — D539 Nutritional anemia, unspecified: Secondary | ICD-10-CM | POA: Diagnosis not present

## 2022-10-15 DIAGNOSIS — Z7984 Long term (current) use of oral hypoglycemic drugs: Secondary | ICD-10-CM | POA: Diagnosis not present

## 2022-10-15 DIAGNOSIS — E119 Type 2 diabetes mellitus without complications: Secondary | ICD-10-CM | POA: Diagnosis not present

## 2022-10-28 DIAGNOSIS — D539 Nutritional anemia, unspecified: Secondary | ICD-10-CM | POA: Diagnosis not present

## 2022-11-20 DIAGNOSIS — G894 Chronic pain syndrome: Secondary | ICD-10-CM | POA: Diagnosis not present

## 2022-11-20 DIAGNOSIS — F32A Depression, unspecified: Secondary | ICD-10-CM | POA: Diagnosis not present

## 2022-12-17 DIAGNOSIS — R809 Proteinuria, unspecified: Secondary | ICD-10-CM | POA: Diagnosis not present

## 2022-12-28 DIAGNOSIS — Z961 Presence of intraocular lens: Secondary | ICD-10-CM | POA: Diagnosis not present

## 2022-12-28 DIAGNOSIS — H04123 Dry eye syndrome of bilateral lacrimal glands: Secondary | ICD-10-CM | POA: Diagnosis not present

## 2022-12-28 DIAGNOSIS — Z5181 Encounter for therapeutic drug level monitoring: Secondary | ICD-10-CM | POA: Diagnosis not present

## 2022-12-28 DIAGNOSIS — H35373 Puckering of macula, bilateral: Secondary | ICD-10-CM | POA: Diagnosis not present

## 2022-12-28 DIAGNOSIS — H18593 Other hereditary corneal dystrophies, bilateral: Secondary | ICD-10-CM | POA: Diagnosis not present

## 2022-12-28 DIAGNOSIS — E119 Type 2 diabetes mellitus without complications: Secondary | ICD-10-CM | POA: Diagnosis not present

## 2022-12-28 DIAGNOSIS — H401132 Primary open-angle glaucoma, bilateral, moderate stage: Secondary | ICD-10-CM | POA: Diagnosis not present

## 2023-01-11 ENCOUNTER — Ambulatory Visit (INDEPENDENT_AMBULATORY_CARE_PROVIDER_SITE_OTHER): Payer: Medicare Other | Admitting: Podiatry

## 2023-01-11 ENCOUNTER — Encounter: Payer: Self-pay | Admitting: Podiatry

## 2023-01-11 VITALS — BP 126/56

## 2023-01-11 DIAGNOSIS — B351 Tinea unguium: Secondary | ICD-10-CM

## 2023-01-11 DIAGNOSIS — M2042 Other hammer toe(s) (acquired), left foot: Secondary | ICD-10-CM

## 2023-01-11 DIAGNOSIS — M79675 Pain in left toe(s): Secondary | ICD-10-CM | POA: Diagnosis not present

## 2023-01-11 DIAGNOSIS — M2041 Other hammer toe(s) (acquired), right foot: Secondary | ICD-10-CM | POA: Diagnosis not present

## 2023-01-11 DIAGNOSIS — M79674 Pain in right toe(s): Secondary | ICD-10-CM

## 2023-01-11 DIAGNOSIS — E119 Type 2 diabetes mellitus without complications: Secondary | ICD-10-CM

## 2023-01-11 NOTE — Progress Notes (Signed)
ANNUAL DIABETIC FOOT EXAM  Subjective: Dorothy Anderson presents today {jgcomplaint:23593}.  Chief Complaint  Patient presents with   Nail Problem    [facility] BS-did not check today A1C-6.4 PCP-Varadarjan PCP VST-11/2022    Patient confirms h/o diabetes.  Patient relates {Numbers; 0-100:15068} 82 year h/o diabetes.  Patient denies any h/o foot wounds.  Patient has h/o foot ulcer of {jgPodToeLocator:23637}, which healed via help of ***.  Patient has h/o amputation(s): {jgamp:23617}.  Patient endorses symptoms of foot numbness.   Patient endorses symptoms of foot tingling.  Patient endorses symptoms of burning in feet.  Patient endorses symptoms of pins/needles sensation in feet.  Patient denies any numbness, tingling, burning, or pins/needle sensation in feet.  Patient has been diagnosed with neuropathy and it is managed with {JGNEUROPATHYMEDS:27053}.  Risk factors: {jgriskfactors:24044}.  Leeroy Cha, MD is patient's PCP. Last visit was {Time; dates multiple:15870}***.  Past Medical History:  Diagnosis Date   Arthritis KNEES   OA   Calculus of left kidney    Complication of anesthesia    slow to wake up   Diabetes mellitus without complication (HCC)    PRE DIABETIC ORAL MEDS    Frequency of urination    GERD (gastroesophageal reflux disease)    Glaucoma BILATERAL EYES   History of kidney stones    Hydronephrosis, left    DR. STEVEN DAHLSTADT   Hyperglycemia    Hypertension    Nocturia    Pneumonia    Wears glasses    Patient Active Problem List   Diagnosis Date Noted   Chronic kidney disease, stage 2 (mild) 04/22/2021   Degeneration of lumbar intervertebral disc 04/22/2021   Osteopenia 04/22/2021   Abnormal findings on diagnostic imaging of other specified body structures 10/26/2020   Body mass index (BMI) 29.0-29.9, adult 10/26/2020   Esophageal reflux 10/26/2020   Overactive bladder 10/26/2020   Personal history of colonic polyps 10/26/2020    Ptosis of both eyelids 01/28/2015   Diabetes type 2, controlled ([place]) 10/25/2014   High blood pressure 10/25/2014   Osteoarthritis of left knee 01/17/2013    Class: Diagnosis of   Osteoarthrosis involving lower leg 01/17/2013   Past Surgical History:  Procedure Laterality Date   CARPAL TUNNEL RELEASE  06-17-2005   RIGHT   CATARACT EXTRACTION W/ INTRAOCULAR LENS  IMPLANT, BILATERAL     CYSTO/ LEFT RETROGRADE PYELOGRAM/ LEFT URETERAL STENT PLACEMENT  04-15-2005   URETERAL STONE   CYSTOSCOPY/RETROGRADE/URETEROSCOPY  01/27/2012   Procedure: CYSTOSCOPY/RETROGRADE/URETEROSCOPY;  Surgeon: Franchot Gallo, MD;  Location: [facility];  Service: Urology;  Laterality: Right;   EXTRACORPOREAL SHOCK WAVE LITHOTRIPSY  11-09-11   RIGHT   TONSILLECTOMY AND ADENOIDECTOMY  AGE 82   TOTAL KNEE ARTHROPLASTY  09-29-2005   RIGHT   TOTAL KNEE ARTHROPLASTY Left 01/17/2013   Procedure: TOTAL KNEE ARTHROPLASTY;  Surgeon: Meredith Pel, MD;  Location: [facility];  Service: Orthopedics;  Laterality: Left;  Left Total Knee Arthroplasty   Current Outpatient Medications on File Prior to Visit  Medication Sig Dispense Refill   aspirin (ASPIRIN 81) 81 MG EC tablet 1 tablet     brimonidine (ALPHAGAN) 0.2 % ophthalmic solution Place 1 drop into both eyes 2 (two) times a day.      brimonidine (ALPHAGAN) 0.2 % ophthalmic solution 1 drop into both eyes     Brimonidine-Dorzolamide 0.15-2 % SOLN See admin instructions.     calcium carbonate (SUPER CALCIUM) 1500 (600 Ca) MG TABS tablet 1 tablet with meals  CALCIUM-VITAMIN D PO Take 1 tablet by mouth daily.     cephALEXin (KEFLEX) 500 MG capsule Take 500 mg by mouth 2 (two) times daily.     cholecalciferol (VITAMIN D) 1000 UNITS tablet Take 1,000 Units by mouth daily.     cholecalciferol (VITAMIN D3) 25 MCG (1000 UNIT) tablet 1 tablet     docusate sodium (COLACE) 100 MG capsule Take 100 mg by mouth 2 (two) times daily as needed for moderate constipation.      doxycycline (VIBRA-TABS) 100 MG tablet Take 100 mg by mouth 2 (two) times daily.     DULoxetine (CYMBALTA) 30 MG capsule Take 30 mg by mouth daily.      famotidine (PEPCID) 20 MG tablet Take 20 mg by mouth See admin instructions. At bedtime every other night - alternating with Prevacid     famotidine (PEPCID) 40 MG tablet 1 tablet at bedtime.     Lancets (ONETOUCH DELICA PLUS [id number]) MISC U 1 LANCET ONCE A DAY     Lansoprazole (PREVACID PO) 1 capsule before a meal     lansoprazole (PREVACID) 15 MG capsule Take 15 mg by mouth See admin instructions. Every other day twice daily - alternating with famotidine     latanoprost (XALATAN) 0.005 % ophthalmic solution Place 1 drop into both eyes at bedtime.     latanoprost (XALATAN) 0.005 % ophthalmic solution 1 drop into affected eye in the evening     latanoprost (XALATAN) 0.005 % ophthalmic solution 1 drop into both eyes in the evening     metFORMIN (GLUCOPHAGE) 500 MG tablet 2 tablet with a meal     metFORMIN (GLUCOPHAGE-XR) 500 MG 24 hr tablet Take 1,000 mg by mouth daily with breakfast.      metoprolol tartrate (LOPRESSOR) 25 MG tablet Take 1 tablet by mouth 2 (two) times daily with a meal.     Multiple Vitamin (MULTIVITAMIN ADULT) TABS      Multiple Vitamins-Minerals (MULTIVITAMIN ADULTS) TABS 1 tablet     nirmatrelvir & ritonavir (PAXLOVID) 20 x 150 MG & 10 x '100MG'$  TBPK Take by mouth.     ONETOUCH VERIO test strip U ONCE A DAY     oxybutynin (DITROPAN) 5 MG tablet 1 tablet     oxybutynin (DITROPAN-XL) 5 MG 24 hr tablet Take 5 mg by mouth in the morning and at bedtime.     pravastatin (PRAVACHOL) 10 MG tablet Take 1 tablet by mouth at bedtime.     simvastatin (ZOCOR) 10 MG tablet 1 tablet in the evening     traMADol (ULTRAM) 50 MG tablet 1 tablet as needed     No current facility-administered medications on file prior to visit.    Allergies  Allergen Reactions   Amiloride Other (See Comments)    High blood level of potassium    Atorvastatin     Other reaction(s): muscle weakness and pain   Atorvastatin Calcium Other (See Comments)    Muscle weakness and pain   Darunavir     Other reaction(s): insomnia   Darvon Other (See Comments)    MADE PT STAY AWAKE    Emetrol     Other reaction(s): Unknown   Macrobid [Nitrofurantoin Monohydrate Macrocrystals] Nausea And Vomiting   Nitrofurantoin Nausea And Vomiting    Other reaction(s): Unknown Other reaction(s): nausea   Propoxyphene Other (See Comments)    MADE PT STAY AWAKE   Social History   Occupational History   Not on file  Tobacco Use   Smoking  status: Never   Smokeless tobacco: Never  Vaping Use   Vaping Use: Never used  Substance and Sexual Activity   Alcohol use: No   Drug use: No   Sexual activity: Not on file   Family History  Problem Relation Age of Onset   CVA Mother    Heart attack Father    Immunization History  Administered Date(s) Administered   Influenza-Unspecified 08/09/2013   Zoster Recombinat (Shingrix) 11/25/2018     Review of Systems: Negative except as noted in the HPI.   Objective: Vitals:   01/11/23 1332  BP: (!) 126/56    Dorothy Anderson is a pleasant 82 y.o. female in NAD. AAO X 3.  Vascular Examination: Capillary refill time to digits immediate b/l. Palpable DP pulse(s) b/l LE. Faintly palpable PT pulse(s) b/l LE. No pain with calf compression b/l. Lower extremity skin temperature gradient within normal limits. No edema noted b/l LE. Varicosities present b/l.  Dermatological Examination: Pedal skin thin, shiny and atrophic b/l LE. No open wounds b/l LE. No interdigital macerations noted b/l LE. Toenails 1-5 b/l elongated, discolored, dystrophic, thickened, crumbly with subungual debris and tenderness to dorsal palpation. No hyperkeratotic nor porokeratotic lesions present on today's visit.  Neurological Examination: Protective sensation intact 5/5 intact bilaterally with 10g monofilament b/l. Vibratory sensation  intact b/l.  Musculoskeletal Examination: Muscle strength 5/5 to all lower extremity muscle groups bilaterally. Hammertoe deformity noted 2-5 b/l. Wearing appropriate fitting shoe gear.  Footwear Assessment: Does the patient wear appropriate shoes? Yes. Does the patient need inserts/orthotics? No.  ADA Risk Categorization: Low Risk :  Patient has all of the following: Intact protective sensation No prior foot ulcer  No severe deformity Pedal pulses present  Assessment: 1. Pain due to onychomycosis of toenails of both feet   2. Hammer toes of both feet   3. Controlled type 2 diabetes mellitus without complication, without long-term current use of insulin ([place])   4. Encounter for diabetic foot exam ([place])      Plan: No orders of the defined types were placed in this encounter.   No orders of the defined types were placed in this encounter.   None  {jgplan:23602::"-Patient/POA to call should there be question/concern in the interim."} Return in about 4 months (around 05/13/2023).  Marzetta Board, DPM

## 2023-02-01 DIAGNOSIS — E1169 Type 2 diabetes mellitus with other specified complication: Secondary | ICD-10-CM | POA: Diagnosis not present

## 2023-02-01 DIAGNOSIS — R54 Age-related physical debility: Secondary | ICD-10-CM | POA: Diagnosis not present

## 2023-02-01 DIAGNOSIS — G894 Chronic pain syndrome: Secondary | ICD-10-CM | POA: Diagnosis not present

## 2023-02-01 DIAGNOSIS — I10 Essential (primary) hypertension: Secondary | ICD-10-CM | POA: Diagnosis not present

## 2023-02-01 DIAGNOSIS — E2839 Other primary ovarian failure: Secondary | ICD-10-CM | POA: Diagnosis not present

## 2023-02-01 DIAGNOSIS — Z0000 Encounter for general adult medical examination without abnormal findings: Secondary | ICD-10-CM | POA: Diagnosis not present

## 2023-02-01 DIAGNOSIS — Z1331 Encounter for screening for depression: Secondary | ICD-10-CM | POA: Diagnosis not present

## 2023-02-01 DIAGNOSIS — K219 Gastro-esophageal reflux disease without esophagitis: Secondary | ICD-10-CM | POA: Diagnosis not present

## 2023-02-01 DIAGNOSIS — F32A Depression, unspecified: Secondary | ICD-10-CM | POA: Diagnosis not present

## 2023-02-11 ENCOUNTER — Other Ambulatory Visit: Payer: Self-pay | Admitting: Obstetrics and Gynecology

## 2023-02-11 DIAGNOSIS — E2839 Other primary ovarian failure: Secondary | ICD-10-CM

## 2023-04-01 DIAGNOSIS — H04123 Dry eye syndrome of bilateral lacrimal glands: Secondary | ICD-10-CM | POA: Diagnosis not present

## 2023-04-01 DIAGNOSIS — H18593 Other hereditary corneal dystrophies, bilateral: Secondary | ICD-10-CM | POA: Diagnosis not present

## 2023-04-01 DIAGNOSIS — H401132 Primary open-angle glaucoma, bilateral, moderate stage: Secondary | ICD-10-CM | POA: Diagnosis not present

## 2023-05-03 DIAGNOSIS — R825 Elevated urine levels of drugs, medicaments and biological substances: Secondary | ICD-10-CM | POA: Diagnosis not present

## 2023-05-03 DIAGNOSIS — N952 Postmenopausal atrophic vaginitis: Secondary | ICD-10-CM | POA: Diagnosis not present

## 2023-05-03 DIAGNOSIS — N3281 Overactive bladder: Secondary | ICD-10-CM | POA: Diagnosis not present

## 2023-05-18 ENCOUNTER — Ambulatory Visit (INDEPENDENT_AMBULATORY_CARE_PROVIDER_SITE_OTHER): Payer: Medicare Other | Admitting: Podiatry

## 2023-05-18 ENCOUNTER — Encounter: Payer: Self-pay | Admitting: Podiatry

## 2023-05-18 VITALS — BP 102/53

## 2023-05-18 DIAGNOSIS — M79675 Pain in left toe(s): Secondary | ICD-10-CM | POA: Diagnosis not present

## 2023-05-18 DIAGNOSIS — E119 Type 2 diabetes mellitus without complications: Secondary | ICD-10-CM | POA: Diagnosis not present

## 2023-05-18 DIAGNOSIS — B351 Tinea unguium: Secondary | ICD-10-CM | POA: Diagnosis not present

## 2023-05-18 DIAGNOSIS — M79674 Pain in right toe(s): Secondary | ICD-10-CM

## 2023-05-18 NOTE — Progress Notes (Signed)
  Subjective:  Patient ID: Dorothy Anderson, female    DOB: 26-Feb-1941,  MRN: [id number]  Saraya Tirey Renbarger presents to clinic today for preventative diabetic foot care and painful elongated mycotic toenails 1-5 bilaterally which are tender when wearing enclosed shoe gear. Pain is relieved with periodic professional debridement.   New problem(s): None.   PCP is Lorenda Ishihara, MD.  Allergies  Allergen Reactions   Amiloride Other (See Comments)    High blood level of potassium   Atorvastatin     Other reaction(s): muscle weakness and pain   Atorvastatin Calcium Other (See Comments)    Muscle weakness and pain   Darunavir     Other reaction(s): insomnia   Darvon Other (See Comments)    MADE PT STAY AWAKE    Emetrol     Other reaction(s): Unknown   Macrobid [Nitrofurantoin Monohydrate Macrocrystals] Nausea And Vomiting   Nitrofurantoin Nausea And Vomiting    Other reaction(s): Unknown Other reaction(s): nausea   Propoxyphene Other (See Comments)    MADE PT STAY AWAKE    Review of Systems: Negative except as noted in the HPI.  Objective: No changes noted in today's physical examination. Vitals:   05/18/23 1358  BP: (!) 102/53   Dorothy Anderson is a pleasant 82 y.o. female in NAD. AAO x 3.  Vascular Examination: Capillary refill time immediate b/l. Vascular status intact b/l with palpable DP pulses; faintly palpable PT pulses. Pedal hair absent b/l. No edema. No pain with calf compression b/l. Skin temperature gradient WNL b/l. No cyanosis or clubbing noted b/l. No ischemia or gangrene b/l. Varicosities present b/l.  Neurological Examination: Sensation grossly intact b/l with 10 gram monofilament. Vibratory sensation intact b/l.   Dermatological Examination: Pedal skin with normal turgor, texture and tone b/l.  No open wounds. No interdigital macerations.   Toenails 1-5 b/l thick, discolored, elongated with subungual debris and pain on dorsal palpation.   No  hyperkeratotic nor porokeratotic lesions present on today's visit.  Musculoskeletal Examination: Muscle strength 5/5 to all lower extremity muscle groups bilaterally. Hammertoe deformity noted 2-5 b/l.  Radiographs: None  Assessment/Plan: 1. Pain due to onychomycosis of toenails of both feet   2. Controlled type 2 diabetes mellitus without complication, without long-term current use of insulin (HCC)   -Consent given for treatment as described below: -Examined patient. -Patient to continue soft, supportive shoe gear daily. -Mycotic toenails 1-5 bilaterally were debrided in length and girth with sterile nail nippers and dremel without incident. -Patient/POA to call should there be question/concern in the interim.   Return in about 4 months (around 09/18/2023).  Freddie Breech, DPM

## 2023-06-22 DIAGNOSIS — H04123 Dry eye syndrome of bilateral lacrimal glands: Secondary | ICD-10-CM | POA: Diagnosis not present

## 2023-06-22 DIAGNOSIS — H18593 Other hereditary corneal dystrophies, bilateral: Secondary | ICD-10-CM | POA: Diagnosis not present

## 2023-07-13 DIAGNOSIS — H18593 Other hereditary corneal dystrophies, bilateral: Secondary | ICD-10-CM | POA: Diagnosis not present

## 2023-07-13 DIAGNOSIS — Z23 Encounter for immunization: Secondary | ICD-10-CM | POA: Diagnosis not present

## 2023-07-13 DIAGNOSIS — H04123 Dry eye syndrome of bilateral lacrimal glands: Secondary | ICD-10-CM | POA: Diagnosis not present

## 2023-08-05 DIAGNOSIS — Z23 Encounter for immunization: Secondary | ICD-10-CM | POA: Diagnosis not present

## 2023-08-06 DIAGNOSIS — H401132 Primary open-angle glaucoma, bilateral, moderate stage: Secondary | ICD-10-CM | POA: Diagnosis not present

## 2023-08-06 DIAGNOSIS — Z961 Presence of intraocular lens: Secondary | ICD-10-CM | POA: Diagnosis not present

## 2023-08-06 DIAGNOSIS — H18593 Other hereditary corneal dystrophies, bilateral: Secondary | ICD-10-CM | POA: Diagnosis not present

## 2023-08-06 DIAGNOSIS — H04123 Dry eye syndrome of bilateral lacrimal glands: Secondary | ICD-10-CM | POA: Diagnosis not present

## 2023-08-25 DIAGNOSIS — M85859 Other specified disorders of bone density and structure, unspecified thigh: Secondary | ICD-10-CM | POA: Diagnosis not present

## 2023-08-25 DIAGNOSIS — I10 Essential (primary) hypertension: Secondary | ICD-10-CM | POA: Diagnosis not present

## 2023-08-25 DIAGNOSIS — E1169 Type 2 diabetes mellitus with other specified complication: Secondary | ICD-10-CM | POA: Diagnosis not present

## 2023-08-25 DIAGNOSIS — G894 Chronic pain syndrome: Secondary | ICD-10-CM | POA: Diagnosis not present

## 2023-08-31 DIAGNOSIS — Z1231 Encounter for screening mammogram for malignant neoplasm of breast: Secondary | ICD-10-CM | POA: Diagnosis not present

## 2023-09-03 ENCOUNTER — Other Ambulatory Visit: Payer: Self-pay | Admitting: Obstetrics and Gynecology

## 2023-09-03 DIAGNOSIS — R928 Other abnormal and inconclusive findings on diagnostic imaging of breast: Secondary | ICD-10-CM

## 2023-09-08 ENCOUNTER — Ambulatory Visit
Admission: RE | Admit: 2023-09-08 | Discharge: 2023-09-08 | Disposition: A | Discharge status: Home / Self Care | Payer: Medicare Other | Source: Ambulatory Visit | Attending: Obstetrics and Gynecology | Admitting: Obstetrics and Gynecology

## 2023-09-08 ENCOUNTER — Other Ambulatory Visit: Payer: Self-pay | Admitting: Obstetrics and Gynecology

## 2023-09-08 DIAGNOSIS — R928 Other abnormal and inconclusive findings on diagnostic imaging of breast: Secondary | ICD-10-CM

## 2023-09-08 DIAGNOSIS — N6320 Unspecified lump in the left breast, unspecified quadrant: Secondary | ICD-10-CM

## 2023-09-08 DIAGNOSIS — N6321 Unspecified lump in the left breast, upper outer quadrant: Secondary | ICD-10-CM | POA: Diagnosis not present

## 2023-09-10 ENCOUNTER — Ambulatory Visit
Admission: RE | Admit: 2023-09-10 | Discharge: 2023-09-10 | Disposition: A | Discharge status: Home / Self Care | Payer: Medicare Other | Source: Ambulatory Visit | Attending: Obstetrics and Gynecology | Admitting: Obstetrics and Gynecology

## 2023-09-10 DIAGNOSIS — N6321 Unspecified lump in the left breast, upper outer quadrant: Secondary | ICD-10-CM | POA: Diagnosis not present

## 2023-09-10 DIAGNOSIS — N6320 Unspecified lump in the left breast, unspecified quadrant: Secondary | ICD-10-CM

## 2023-09-10 DIAGNOSIS — C50412 Malignant neoplasm of upper-outer quadrant of left female breast: Secondary | ICD-10-CM | POA: Diagnosis not present

## 2023-09-10 HISTORY — PX: BREAST BIOPSY: SHX20

## 2023-09-13 LAB — SURGICAL PATHOLOGY

## 2023-09-14 ENCOUNTER — Ambulatory Visit: Payer: Medicare Other | Admitting: Podiatry

## 2023-09-20 ENCOUNTER — Other Ambulatory Visit: Payer: Self-pay | Admitting: General Surgery

## 2023-09-20 DIAGNOSIS — Z170 Estrogen receptor positive status [ER+]: Secondary | ICD-10-CM | POA: Diagnosis not present

## 2023-09-20 DIAGNOSIS — C50412 Malignant neoplasm of upper-outer quadrant of left female breast: Secondary | ICD-10-CM | POA: Diagnosis not present

## 2023-09-20 DIAGNOSIS — Z803 Family history of malignant neoplasm of breast: Secondary | ICD-10-CM | POA: Diagnosis not present

## 2023-09-20 DIAGNOSIS — C50212 Malignant neoplasm of upper-inner quadrant of left female breast: Secondary | ICD-10-CM

## 2023-09-22 ENCOUNTER — Other Ambulatory Visit: Payer: Self-pay | Admitting: General Surgery

## 2023-09-22 ENCOUNTER — Other Ambulatory Visit: Payer: Self-pay

## 2023-09-22 ENCOUNTER — Encounter (HOSPITAL_BASED_OUTPATIENT_CLINIC_OR_DEPARTMENT_OTHER): Payer: Self-pay | Admitting: General Surgery

## 2023-09-22 DIAGNOSIS — Z170 Estrogen receptor positive status [ER+]: Secondary | ICD-10-CM

## 2023-09-23 DIAGNOSIS — G894 Chronic pain syndrome: Secondary | ICD-10-CM | POA: Diagnosis not present

## 2023-09-23 DIAGNOSIS — I10 Essential (primary) hypertension: Secondary | ICD-10-CM | POA: Diagnosis not present

## 2023-09-23 DIAGNOSIS — F411 Generalized anxiety disorder: Secondary | ICD-10-CM | POA: Diagnosis not present

## 2023-09-23 DIAGNOSIS — E1169 Type 2 diabetes mellitus with other specified complication: Secondary | ICD-10-CM | POA: Diagnosis not present

## 2023-09-23 DIAGNOSIS — R0609 Other forms of dyspnea: Secondary | ICD-10-CM | POA: Diagnosis not present

## 2023-09-23 DIAGNOSIS — Z170 Estrogen receptor positive status [ER+]: Secondary | ICD-10-CM | POA: Diagnosis not present

## 2023-09-23 DIAGNOSIS — C50412 Malignant neoplasm of upper-outer quadrant of left female breast: Secondary | ICD-10-CM | POA: Diagnosis not present

## 2023-09-24 ENCOUNTER — Telehealth: Payer: Self-pay | Admitting: Radiation Oncology

## 2023-09-24 NOTE — Telephone Encounter (Signed)
Called patient's Daughter Pricilla Loveless to schedule patient for consultation w. Dr. Mitzi Hansen. Patients daughter was adamant Dr. Donell Beers advised patient would not need Radiation, refused to schedule consult at this time. Contacted Dr. Arita Miss office to inform the provider the daughter would not like to schedule at this time, Dr. Arita Miss nurse ask to not close out the referral just yet, agreed to calling the patient's daughter. Dr. Arita Miss nurse contacted patient daughter to advised the Radiation consult would be necessary if Dr. Donell Beers does suggest Radiation after surgery to establish care and to fully educate patient on all options for treatment. Patient's daughter still very adamantly refused scheduling consult at this time. Will be closing referral until further notice. Dr. Arita Miss team aware of referral closing and referring patient in the future if XRT recommended after surgery.

## 2023-09-27 ENCOUNTER — Ambulatory Visit
Admission: RE | Admit: 2023-09-27 | Discharge: 2023-09-27 | Disposition: A | Discharge status: Home / Self Care | Payer: Medicare Other | Source: Ambulatory Visit | Attending: General Surgery | Admitting: General Surgery

## 2023-09-27 DIAGNOSIS — D0512 Intraductal carcinoma in situ of left breast: Secondary | ICD-10-CM | POA: Diagnosis not present

## 2023-09-27 DIAGNOSIS — Z170 Estrogen receptor positive status [ER+]: Secondary | ICD-10-CM

## 2023-09-27 HISTORY — PX: BREAST BIOPSY: SHX20

## 2023-09-29 ENCOUNTER — Ambulatory Visit (HOSPITAL_BASED_OUTPATIENT_CLINIC_OR_DEPARTMENT_OTHER): Payer: Medicare Other | Admitting: Anesthesiology

## 2023-09-29 ENCOUNTER — Encounter (HOSPITAL_BASED_OUTPATIENT_CLINIC_OR_DEPARTMENT_OTHER)
Admission: RE | Disposition: A | Discharge status: Home / Self Care | Payer: Self-pay | Source: Home / Self Care | Attending: General Surgery

## 2023-09-29 ENCOUNTER — Encounter (HOSPITAL_BASED_OUTPATIENT_CLINIC_OR_DEPARTMENT_OTHER): Payer: Self-pay | Admitting: General Surgery

## 2023-09-29 ENCOUNTER — Other Ambulatory Visit: Payer: Self-pay

## 2023-09-29 ENCOUNTER — Other Ambulatory Visit: Payer: Self-pay | Admitting: *Deleted

## 2023-09-29 ENCOUNTER — Ambulatory Visit
Admission: RE | Admit: 2023-09-29 | Discharge: 2023-09-29 | Disposition: A | Discharge status: Home / Self Care | Payer: Medicare Other | Source: Ambulatory Visit | Attending: General Surgery | Admitting: General Surgery

## 2023-09-29 ENCOUNTER — Ambulatory Visit (HOSPITAL_BASED_OUTPATIENT_CLINIC_OR_DEPARTMENT_OTHER)
Admission: RE | Admit: 2023-09-29 | Discharge: 2023-09-29 | Disposition: A | Discharge status: Home / Self Care | Payer: Medicare Other | Attending: General Surgery | Admitting: General Surgery

## 2023-09-29 DIAGNOSIS — F419 Anxiety disorder, unspecified: Secondary | ICD-10-CM | POA: Insufficient documentation

## 2023-09-29 DIAGNOSIS — C50412 Malignant neoplasm of upper-outer quadrant of left female breast: Secondary | ICD-10-CM | POA: Diagnosis not present

## 2023-09-29 DIAGNOSIS — N6022 Fibroadenosis of left breast: Secondary | ICD-10-CM | POA: Insufficient documentation

## 2023-09-29 DIAGNOSIS — Z01818 Encounter for other preprocedural examination: Secondary | ICD-10-CM

## 2023-09-29 DIAGNOSIS — I10 Essential (primary) hypertension: Secondary | ICD-10-CM | POA: Diagnosis not present

## 2023-09-29 DIAGNOSIS — Z170 Estrogen receptor positive status [ER+]: Secondary | ICD-10-CM | POA: Diagnosis not present

## 2023-09-29 DIAGNOSIS — D0512 Intraductal carcinoma in situ of left breast: Secondary | ICD-10-CM | POA: Diagnosis not present

## 2023-09-29 DIAGNOSIS — C50912 Malignant neoplasm of unspecified site of left female breast: Secondary | ICD-10-CM | POA: Diagnosis not present

## 2023-09-29 DIAGNOSIS — Z803 Family history of malignant neoplasm of breast: Secondary | ICD-10-CM | POA: Diagnosis not present

## 2023-09-29 DIAGNOSIS — M1990 Unspecified osteoarthritis, unspecified site: Secondary | ICD-10-CM | POA: Diagnosis not present

## 2023-09-29 DIAGNOSIS — Z1721 Progesterone receptor positive status: Secondary | ICD-10-CM | POA: Insufficient documentation

## 2023-09-29 DIAGNOSIS — F32A Depression, unspecified: Secondary | ICD-10-CM | POA: Insufficient documentation

## 2023-09-29 DIAGNOSIS — Z791 Long term (current) use of non-steroidal anti-inflammatories (NSAID): Secondary | ICD-10-CM | POA: Insufficient documentation

## 2023-09-29 DIAGNOSIS — E119 Type 2 diabetes mellitus without complications: Secondary | ICD-10-CM | POA: Diagnosis not present

## 2023-09-29 DIAGNOSIS — K219 Gastro-esophageal reflux disease without esophagitis: Secondary | ICD-10-CM | POA: Insufficient documentation

## 2023-09-29 DIAGNOSIS — Z79899 Other long term (current) drug therapy: Secondary | ICD-10-CM | POA: Diagnosis not present

## 2023-09-29 DIAGNOSIS — Z7984 Long term (current) use of oral hypoglycemic drugs: Secondary | ICD-10-CM | POA: Insufficient documentation

## 2023-09-29 HISTORY — DX: Anxiety disorder, unspecified: F41.9

## 2023-09-29 HISTORY — DX: Depression, unspecified: F32.A

## 2023-09-29 HISTORY — PX: BREAST LUMPECTOMY WITH RADIOACTIVE SEED LOCALIZATION: SHX6424

## 2023-09-29 LAB — GLUCOSE, CAPILLARY
Glucose-Capillary: 142 mg/dL — ABNORMAL HIGH (ref 70–99)
Glucose-Capillary: 145 mg/dL — ABNORMAL HIGH (ref 70–99)

## 2023-09-29 SURGERY — BREAST LUMPECTOMY WITH RADIOACTIVE SEED LOCALIZATION
Anesthesia: General | Site: Breast | Laterality: Left

## 2023-09-29 MED ORDER — PROPOFOL 10 MG/ML IV BOLUS
INTRAVENOUS | Status: DC | PRN
  Administered 2023-09-29: 150 mg via INTRAVENOUS

## 2023-09-29 MED ORDER — DROPERIDOL 2.5 MG/ML IJ SOLN: 0.6250 mg | Freq: Once | INTRAMUSCULAR | Status: DC | PRN

## 2023-09-29 MED ORDER — FENTANYL CITRATE (PF) 100 MCG/2ML IJ SOLN: 25.0000 ug | INTRAMUSCULAR | Status: DC | PRN

## 2023-09-29 MED ORDER — LIDOCAINE HCL (CARDIAC) PF 100 MG/5ML IV SOSY
PREFILLED_SYRINGE | INTRAVENOUS | Status: DC | PRN
  Administered 2023-09-29: 60 mg via INTRAVENOUS

## 2023-09-29 MED ORDER — CHLORHEXIDINE GLUCONATE CLOTH 2 % EX PADS: 6.0000 | MEDICATED_PAD | Freq: Once | CUTANEOUS | Status: DC

## 2023-09-29 MED ORDER — ONDANSETRON HCL 4 MG/2ML IJ SOLN
INTRAMUSCULAR | Status: AC
  Filled 2023-09-29: qty 2

## 2023-09-29 MED ORDER — ACETAMINOPHEN 500 MG PO TABS
1000.0000 mg | ORAL_TABLET | Freq: Once | ORAL | Status: AC
  Administered 2023-09-29: 1000 mg via ORAL

## 2023-09-29 MED ORDER — HYDROCODONE-ACETAMINOPHEN 5-325 MG PO TABS
ORAL_TABLET | ORAL | Status: AC
  Filled 2023-09-29: qty 1

## 2023-09-29 MED ORDER — HYDROCODONE-ACETAMINOPHEN 5-325 MG PO TABS: 1.0000 | ORAL_TABLET | Freq: Four times a day (QID) | ORAL | 0 refills | Status: DC | PRN

## 2023-09-29 MED ORDER — CEFAZOLIN SODIUM-DEXTROSE 2-4 GM/100ML-% IV SOLN
INTRAVENOUS | Status: AC
  Filled 2023-09-29: qty 100

## 2023-09-29 MED ORDER — EPHEDRINE SULFATE (PRESSORS) 50 MG/ML IJ SOLN
INTRAMUSCULAR | Status: DC | PRN
  Administered 2023-09-29: 10 mg via INTRAVENOUS
  Administered 2023-09-29 (×2): 5 mg via INTRAVENOUS

## 2023-09-29 MED ORDER — DEXAMETHASONE SODIUM PHOSPHATE 4 MG/ML IJ SOLN
INTRAMUSCULAR | Status: DC | PRN
  Administered 2023-09-29: 5 mg via INTRAVENOUS

## 2023-09-29 MED ORDER — OXYCODONE HCL 5 MG PO TABS
ORAL_TABLET | ORAL | Status: AC
  Filled 2023-09-29: qty 1

## 2023-09-29 MED ORDER — LIDOCAINE HCL (PF) 1 % IJ SOLN
INTRAMUSCULAR | Status: DC | PRN
  Administered 2023-09-29: 50 mL

## 2023-09-29 MED ORDER — LACTATED RINGERS IV SOLN: INTRAVENOUS | Status: DC

## 2023-09-29 MED ORDER — FENTANYL CITRATE (PF) 100 MCG/2ML IJ SOLN
INTRAMUSCULAR | Status: DC | PRN
  Administered 2023-09-29: 50 ug via INTRAVENOUS
  Administered 2023-09-29 (×2): 25 ug via INTRAVENOUS
  Administered 2023-09-29: 50 ug via INTRAVENOUS

## 2023-09-29 MED ORDER — OXYCODONE HCL 5 MG/5ML PO SOLN: 5.0000 mg | Freq: Once | ORAL | Status: DC | PRN

## 2023-09-29 MED ORDER — HYDROCODONE-ACETAMINOPHEN 5-325 MG PO TABS
1.0000 | ORAL_TABLET | Freq: Once | ORAL | Status: AC
  Administered 2023-09-29: 1 via ORAL

## 2023-09-29 MED ORDER — CEFAZOLIN SODIUM-DEXTROSE 2-4 GM/100ML-% IV SOLN
2.0000 g | INTRAVENOUS | Status: AC
  Administered 2023-09-29: 2 g via INTRAVENOUS

## 2023-09-29 MED ORDER — DEXAMETHASONE SODIUM PHOSPHATE 10 MG/ML IJ SOLN
INTRAMUSCULAR | Status: AC
  Filled 2023-09-29: qty 1

## 2023-09-29 MED ORDER — FENTANYL CITRATE (PF) 100 MCG/2ML IJ SOLN
INTRAMUSCULAR | Status: AC
  Filled 2023-09-29: qty 2

## 2023-09-29 MED ORDER — ACETAMINOPHEN 500 MG PO TABS
ORAL_TABLET | ORAL | Status: AC
  Filled 2023-09-29: qty 2

## 2023-09-29 MED ORDER — ACETAMINOPHEN 500 MG PO TABS: 1000.0000 mg | ORAL_TABLET | ORAL | Status: DC

## 2023-09-29 MED ORDER — OXYCODONE HCL 5 MG PO TABS: 5.0000 mg | ORAL_TABLET | Freq: Once | ORAL | Status: DC | PRN

## 2023-09-29 MED ORDER — PROPOFOL 10 MG/ML IV BOLUS
INTRAVENOUS | Status: AC
  Filled 2023-09-29: qty 20

## 2023-09-29 MED ORDER — ONDANSETRON HCL 4 MG/2ML IJ SOLN
INTRAMUSCULAR | Status: DC | PRN
  Administered 2023-09-29: 4 mg via INTRAVENOUS

## 2023-09-29 SURGICAL SUPPLY — 45 items
BINDER BREAST LRG (GAUZE/BANDAGES/DRESSINGS) IMPLANT
BINDER BREAST MEDIUM (GAUZE/BANDAGES/DRESSINGS) IMPLANT
BINDER BREAST XLRG (GAUZE/BANDAGES/DRESSINGS) IMPLANT
BINDER BREAST XXLRG (GAUZE/BANDAGES/DRESSINGS) IMPLANT
BLADE SURG 10 STRL SS (BLADE) ×1 IMPLANT
BLADE SURG 15 STRL LF DISP TIS (BLADE) IMPLANT
CANISTER SUC SOCK COL 7IN (MISCELLANEOUS) IMPLANT
CANISTER SUCT 1200ML W/VALVE (MISCELLANEOUS) IMPLANT
CHLORAPREP W/TINT 26 (MISCELLANEOUS) ×1 IMPLANT
CLIP TI LARGE 6 (CLIP) ×1 IMPLANT
CLIP TI MEDIUM 6 (CLIP) IMPLANT
COVER BACK TABLE 60X90IN (DRAPES) ×1 IMPLANT
COVER MAYO STAND STRL (DRAPES) ×1 IMPLANT
COVER PROBE CYLINDRICAL 5X96 (MISCELLANEOUS) ×1 IMPLANT
DERMABOND ADVANCED .7 DNX12 (GAUZE/BANDAGES/DRESSINGS) ×1 IMPLANT
DRAPE LAPAROSCOPIC ABDOMINAL (DRAPES) ×1 IMPLANT
DRAPE UTILITY XL STRL (DRAPES) ×1 IMPLANT
ELECT COATED BLADE 2.86 ST (ELECTRODE) ×1 IMPLANT
ELECT REM PT RETURN 9FT ADLT (ELECTROSURGICAL) ×1
ELECTRODE REM PT RTRN 9FT ADLT (ELECTROSURGICAL) ×1 IMPLANT
GAUZE SPONGE 4X4 12PLY STRL LF (GAUZE/BANDAGES/DRESSINGS) ×1 IMPLANT
GLOVE BIO SURGEON STRL SZ 6 (GLOVE) ×1 IMPLANT
GLOVE BIOGEL PI IND STRL 6.5 (GLOVE) ×1 IMPLANT
GOWN STRL REUS W/ TWL LRG LVL3 (GOWN DISPOSABLE) ×1 IMPLANT
GOWN STRL REUS W/ TWL XL LVL3 (GOWN DISPOSABLE) ×1 IMPLANT
KIT MARKER MARGIN INK (KITS) ×1 IMPLANT
LIGHT WAVEGUIDE WIDE FLAT (MISCELLANEOUS) IMPLANT
NDL HYPO 25X1 1.5 SAFETY (NEEDLE) ×1 IMPLANT
NEEDLE HYPO 25X1 1.5 SAFETY (NEEDLE) ×1
NS IRRIG 1000ML POUR BTL (IV SOLUTION) ×1 IMPLANT
PACK BASIN DAY SURGERY FS (CUSTOM PROCEDURE TRAY) ×1 IMPLANT
PENCIL SMOKE EVACUATOR (MISCELLANEOUS) ×1 IMPLANT
SLEEVE SCD COMPRESS KNEE MED (STOCKING) ×1 IMPLANT
SPIKE FLUID TRANSFER (MISCELLANEOUS) IMPLANT
SPONGE T-LAP 18X18 ~~LOC~~+RFID (SPONGE) ×1 IMPLANT
STRIP CLOSURE SKIN 1/2X4 (GAUZE/BANDAGES/DRESSINGS) ×1 IMPLANT
SUT MNCRL AB 4-0 PS2 18 (SUTURE) ×1 IMPLANT
SUT SILK 2 0 SH (SUTURE) IMPLANT
SUT VIC AB 2-0 SH 27XBRD (SUTURE) ×1 IMPLANT
SUT VIC AB 3-0 SH 27X BRD (SUTURE) ×1 IMPLANT
SYR CONTROL 10ML LL (SYRINGE) ×1 IMPLANT
TOWEL GREEN STERILE FF (TOWEL DISPOSABLE) ×1 IMPLANT
TRAY FAXITRON CT DISP (TRAY / TRAY PROCEDURE) ×1 IMPLANT
TUBE CONNECTING 20X1/4 (TUBING) IMPLANT
YANKAUER SUCT BULB TIP NO VENT (SUCTIONS) IMPLANT

## 2023-09-29 NOTE — Transfer of Care (Signed)
Immediate Anesthesia Transfer of Care Note  Patient: Dorothy Anderson  Procedure(s) Performed: LEFT BREAST SEED LOCALIZED LUMPECTOMY (Left: Breast)  Patient Location: PACU  Anesthesia Type:General  Level of Consciousness: awake, alert , and patient cooperative  Airway & Oxygen Therapy: Patient Spontanous Breathing and Patient connected to face mask oxygen  Post-op Assessment: Report given to RN and Post -op Vital signs reviewed and stable  Post vital signs: Reviewed and stable  Last Vitals:  Vitals Value Taken Time  BP    Temp    Pulse    Resp    SpO2      Last Pain:  Vitals:   09/29/23 1335  TempSrc: Oral  PainSc: 0-No pain         Complications: No notable events documented.

## 2023-09-29 NOTE — Discharge Instructions (Addendum)
[facility] Office Phone Number [phone]  BREAST BIOPSY/ PARTIAL MASTECTOMY: POST OP INSTRUCTIONS  Always review your discharge instruction sheet given to you by the facility where your surgery was performed.  IF YOU HAVE DISABILITY OR FAMILY LEAVE FORMS, YOU MUST BRING THEM TO THE OFFICE FOR PROCESSING.  DO NOT GIVE THEM TO YOUR DOCTOR.  Take 2 tylenol (acetominophen) three times a day for 3 days.  If you still have pain, add ibuprofen with food in between if able to take this (if you have kidney issues or stomach issues, do not take ibuprofen).  If both of those are not enough, add the narcotic pain pill.  If you find you are needing a lot of this overnight after surgery, call the next morning for a refill.    Prescriptions will not be filled after 5pm or on week-ends. Take your usually prescribed medications unless otherwise directed You should eat very light the first 24 hours after surgery, such as soup, crackers, pudding, etc.  Resume your normal diet the day after surgery. Most patients will experience some swelling and bruising in the breast.  Ice packs and a good support bra will help.  Swelling and bruising can take several days to resolve.  It is common to experience some constipation if taking pain medication after surgery.  Increasing fluid intake and taking a stool softener will usually help or prevent this problem from occurring.  A mild laxative (Milk of Magnesia or Miralax) should be taken according to package directions if there are no bowel movements after 48 hours. Unless discharge instructions indicate otherwise, you may remove your bandages 48 hours after surgery, and you may shower at that time.  You may have steri-strips (small skin tapes) in place directly over the incision.  These strips should be left on the skin at least for for 7-10 days.    ACTIVITIES:  You may resume regular daily activities (gradually increasing) beginning the next day.  Wearing a  good support bra or sports bra (or the breast binder) minimizes pain and swelling.  You may have sexual intercourse when it is comfortable. No heavy lifting for 1-2 weeks (not over around 10 pounds).  You may drive when you no longer are taking prescription pain medication, you can comfortably wear a seatbelt, and you can safely maneuver your car and apply brakes. RETURN TO WORK:  __________3-14 days depending on job. _______________ Bonita Quin should see your doctor in the office for a follow-up appointment approximately two weeks after your surgery.  Your doctor's nurse will typically make your follow-up appointment when she calls you with your pathology report.  Expect your pathology report 3-4 business days after your surgery.  You may call to check if you do not hear from [place] after three days.   WHEN TO CALL YOUR DOCTOR: Fever over 101.0 Nausea and/or vomiting. Extreme swelling or bruising. Continued bleeding from incision. Increased pain, redness, or drainage from the incision.  The clinic staff is available to answer your questions during regular business hours.  Please don't hesitate to call and ask to speak to one of the nurses for clinical concerns.  If you have a medical emergency, go to the nearest emergency room or call 911.  A surgeon from [facility] is always on call at the hospital.  For further questions, please visit [email or web address]    Post Anesthesia Home Care Instructions  Activity: Get plenty of rest for the remainder of the day. A responsible individual must stay  with you for 24 hours following the procedure.  For the next 24 hours, DO NOT: -Drive a car -Advertising copywriter -Drink alcoholic beverages -Take any medication unless instructed by your physician -Make any legal decisions or sign important papers.  Meals: Start with liquid foods such as gelatin or soup. Progress to regular foods as tolerated. Avoid greasy, spicy, heavy foods. If nausea  and/or vomiting occur, drink only clear liquids until the nausea and/or vomiting subsides. Call your physician if vomiting continues.  Special Instructions/Symptoms: Your throat may feel dry or sore from the anesthesia or the breathing tube placed in your throat during surgery. If this causes discomfort, gargle with warm salt water. The discomfort should disappear within 24 hours.  If you had a scopolamine patch placed behind your ear for the management of post- operative nausea and/or vomiting:  1. The medication in the patch is effective for 72 hours, after which it should be removed.  Wrap patch in a tissue and discard in the trash. Wash hands thoroughly with soap and water. 2. You may remove the patch earlier than 72 hours if you experience unpleasant side effects which may include dry mouth, dizziness or visual disturbances. 3. Avoid touching the patch. Wash your hands with soap and water after contact with the patch.    Next dose of tylenol if needed is after 8pm

## 2023-09-29 NOTE — Interval H&P Note (Signed)
History and Physical Interval Note:  09/29/2023 1:55 PM  Dorothy Anderson  has presented today for surgery, with the diagnosis of LEFT BREAST CANCER.  The various methods of treatment have been discussed with the patient and family. After consideration of risks, benefits and other options for treatment, the patient has consented to  Procedure(s): LEFT BREAST SEED LOCALIZED LUMPECTOMY (Left) as a surgical intervention.  The patient's history has been reviewed, patient examined, no change in status, stable for surgery.  I have reviewed the patient's chart and labs.  Questions were answered to the patient's satisfaction.     Almond Lint

## 2023-09-29 NOTE — Op Note (Signed)
Left Breast Radioactive seed localized lumpectomy  Indications: This patient presents with history of left breast cancer, upper outer quadrant, grade 2 invasive ductal carcinoma, cT1cN0 receptors, strongly ER/PR +, her 2 negative, Ki 67 10%  Pre-operative Diagnosis: left breast cancer  Post-operative Diagnosis: Same  Surgeon: Almond Lint   Anesthesia: General endotracheal anesthesia  ASA Class: 3  Procedure Details  The patient was seen in the Holding Room. The risks, benefits, complications, treatment options, and expected outcomes were discussed with the patient. The possibilities of bleeding, infection, the need for additional procedures, failure to diagnose a condition, and creating a complication requiring other procedures or operations were discussed with the patient. The patient concurred with the proposed plan, giving informed consent.  The site of surgery properly noted/marked. The patient was taken to Operating Room # 1, identified, and the procedure verified as left breast seed localized lumpectomy.  The left breast and chest were prepped and draped in standard fashion. An axillary incision was made near the previously placed radioactive seed.  Dissection was carried down around the point of maximum signal intensity. The cautery was used to perform the dissection.   The specimen was inked with the margin marker paint kit.     Specimen radiography confirmed inclusion of the mammographic lesion, the clip, and the seed.  The background signal in the breast was zero. Additional tissue was taken at all the cardinal directions from the original specimen.  Hemostasis was achieved with cautery.  The cavity was marked with clips on each border other than the anterior border.  The adjacent tissue was rearranged and 2-0 vicryl mastopexy sutures were used to close down the defect some.    The wound was irrigated and closed with 3-0 vicryl interrupted deep dermal sutures and 4-0 monocryl running  subcuticular suture.      Sterile dressings were applied. At the end of the operation, all sponge, instrument, and needle counts were correct.   Findings: Seed, clip in specimen.  After taking all the margins, posterior margin is pectoralis, anterior margin is skin.    Estimated Blood Loss:  min         Specimens: left breast tissue with seed, additional inferior margin, additional lateral margin, additional superior margin, additional medial margin, additional posterior margin, additional anterior margin.           Complications:  None; patient tolerated the procedure well.         Disposition: PACU - hemodynamically stable.         Condition: stable

## 2023-09-29 NOTE — Anesthesia Preprocedure Evaluation (Addendum)
Anesthesia Evaluation  Patient identified by MRN, date of birth, ID band Patient awake    Reviewed: Allergy & Precautions, NPO status , Patient's Chart, lab work & pertinent test results  Airway Mallampati: II  TM Distance: >3 FB Neck ROM: Full    Dental no notable dental hx. (+) Dental Advisory Given, Teeth Intact   Pulmonary pneumonia   Pulmonary exam normal breath sounds clear to auscultation       Cardiovascular hypertension, Pt. on home beta blockers Normal cardiovascular exam Rhythm:Regular Rate:Normal     Neuro/Psych  PSYCHIATRIC DISORDERS Anxiety Depression    negative neurological ROS     GI/Hepatic Neg liver ROS,GERD  Medicated,,  Endo/Other  diabetes, Poorly Controlled    Renal/GU Renal disease     Musculoskeletal  (+) Arthritis ,    Abdominal   Peds  Hematology negative hematology ROS (+)   Anesthesia Other Findings   Reproductive/Obstetrics                             Anesthesia Physical Anesthesia Plan  ASA: 3  Anesthesia Plan: General   Post-op Pain Management: Tylenol PO (pre-op)*   Induction: Intravenous  PONV Risk Score and Plan: 3 and Dexamethasone, Ondansetron and Treatment may vary due to age or medical condition  Airway Management Planned: LMA  Additional Equipment:   Intra-op Plan:   Post-operative Plan: Extubation in OR  Informed Consent: I have reviewed the patients History and Physical, chart, labs and discussed the procedure including the risks, benefits and alternatives for the proposed anesthesia with the patient or authorized representative who has indicated his/her understanding and acceptance.     Dental advisory given  Plan Discussed with: CRNA  Anesthesia Plan Comments:         Anesthesia Quick Evaluation

## 2023-09-29 NOTE — H&P (Signed)
REFERRING PHYSICIAN: Henderson Cloud  PROVIDER: Matthias Hughs, MD  Care Team: Patient Care Team: Lorenda Ishihara, MD as PCP - General (Internal Medicine) Loney Laurence, MD (Obstetrics and Gynecology) Matthias Hughs, MD as Consulting Provider (Surgical Oncology)   MRN: [id number] DOB: 09-19-1941 DATE OF ENCOUNTER: 09/20/2023  Subjective   Chief Complaint: Left Breast Cancer   History of Present Illness: Dorothy Anderson is a 82 y.o. female who is seen today as an office consultation at the request of Dr. Henderson Cloud for evaluation of Left Breast Cancer .   Patient with a new diagnosis of left breast cancer November 2024. The patient had a screening detected left breast mass. Diagnostic imaging was performed. There was a 1.8 cm mass at 2:00 11 cm from the nipple that had a small adjacent intramammary lymph node. This did appear normal in morphology. The left axillary ultrasound was negative for adenopathy. The patient had a core needle biopsy of the breast mass and it showed a grade 2 invasive ductal carcinoma that was strongly ER and PR positive, HER2 negative, Ki-67 of 10%.  The patient has not personally had cancer before. She does have a niece who had breast cancer diagnosed in her 30s and a sister that had breast cancer. She has longevity in her family history with her mother living to be age 72. The patient and family are not aware whether her niece had any genetic testing.  Her main hobbies are reading and occasionally sewing. She does however sometimes rake the leaves.  Of note, her daughter is a gynecologic oncologist in [place].  Diagnostic mammogram/ultrasound: 09/08/23  ACR Breast Density Category b: There are scattered areas of fibroglandular density.  FINDINGS: Additional 2-D and 3-D images are performed. These views confirm presence of an irregular mass with indistinct margins in the UPPER-OUTER QUADRANT of the LEFT breast.  On physical exam, I  palpate a discrete firm mass in the 2 o'clock location of the LEFT breast.  Targeted ultrasound is performed, showing an irregular hypoechoic mass with posterior acoustic enhancement in the 2 o'clock location of the LEFT breast 11 centimeters from the nipple which measures 1.8 x 1.2 x 1.1 centimeters. Internal blood flow is identified on Doppler evaluation. An adjacent intramammary lymph node with normal morphology measures 0.5 centimeters.  Evaluation of the LEFT axilla is negative for adenopathy.  IMPRESSION: Suspicious mass in the 2 o'clock location of the LEFT breast.  RECOMMENDATION: Ultrasound-guided core biopsy of LEFT breast mass.  I have discussed the findings and recommendations with the patient. If applicable, a reminder letter will be sent to the patient regarding the next appointment.  BI-RADS CATEGORY 5: Highly suggestive of malignancy.  Pathology core needle biopsy: 09/10/23 1. Breast, left, needle core biopsy, 2:00 11 cmfn :  INVASIVE DUCTAL CARCINOMA WITH EXTRACELLULAR MUCIN, SEE NOTE  TUBULE FORMATION: SCORE 3  NUCLEAR PLEOMORPHISM: SCORE 2  MITOTIC COUNT: SCORE 1  TOTAL SCORE: 6  OVERALL GRADE: 2  LYMPHOVASCULAR INVASION: NOT IDENTIFIED  CANCER LENGTH: 1.3 CM  CALCIFICATIONS: NOT IDENTIFIED  OTHER FINDINGS: NONE   Receptors: The tumor cells are NEGATIVE for Her2 (0).  Estrogen Receptor: 100%, POSITIVE, STRONG STAINING INTENSITY  Progesterone Receptor: 30%, POSITIVE, STRONG STAINING INTENSITY  Proliferation Marker Ki67: 10%   Review of Systems: A complete review of systems was obtained from the patient. I have reviewed this information and discussed as appropriate with the patient. See HPI as well for other ROS. ROS - weight loss, hearing loss, eye irritation, and  memory loss  Medical History: Past Medical History:  Diagnosis Date  Anesthesia complication  slow emergence  Diabetes mellitus type 2, uncomplicated (CMS/HHS-HCC)  GERD  (gastroesophageal reflux disease)  Glaucoma (increased eye pressure)  History of cancer  Hypertension  Osteoarthritis   Patient Active Problem List  Diagnosis  Osteoarthrosis, unspecified whether generalized or localized, involving lower leg  Diabetes type 2, controlled (CMS/HHS-HCC)  High blood pressure  Ptosis of both eyelids  Malignant neoplasm of upper-outer quadrant of left breast in female, estrogen receptor positive (CMS/HHS-HCC)  Family history of breast cancer   Past Surgical History:  Procedure Laterality Date  LENS EYE SURGERY Bilateral 2012  Dr. Hazle Quant  BLEPHAROPLASTY UPPER EYELID Bilateral 02-12-15  REPLACEMENT TOTAL KNEE BILATERAL    Allergies  Allergen Reactions  Amiloride Other (See Comments)  hyperkalemia  Atorvastatin Muscle Pain  Darvon [Propoxyphene] Other (See Comments)  MADE PT STAY AWAKE  Nitrofurantoin Monohyd/M-Cryst Nausea And Vomiting   Current Outpatient Medications on File Prior to Visit  Medication Sig Dispense Refill  diclofenac (VOLTAREN) 1 % topical gel DIRECTED EXTERNALLY ONCE A DAY 30 DAYS  sertraline (ZOLOFT) 50 MG tablet Take 50 mg by mouth once daily  aspirin 81 MG EC tablet Take by mouth.  brimonidine (ALPHAGAN) 0.2 % ophthalmic solution Place 1 drop into both eyes 2 (two) times daily  calcium carbonate 600 mg calcium (1,500 mg) Tab tablet 1 tablet with meals Orally Twice a day  calcium carbonate-vitamin D3 600mg  (1,000mg ) -1,000 unit Tab Take by mouth.  cholecalciferol (VITAMIN D3) 1,000 unit tablet Take by mouth.  docusate (COLACE) 100 MG capsule Take by mouth.  famotidine (PEPCID) 40 MG tablet Take 1 tablet by mouth at bedtime  HYDROcodone-acetaminophen (NORCO) 5-325 mg tablet  lansoprazole (PREVACID) 15 MG DR capsule Take by mouth.  latanoprost (XALATAN) 0.005 % ophthalmic solution Apply to eye.  metFORMIN (GLUCOPHAGE-XR) 500 MG XR tablet Take 500 mg by mouth daily with dinner.  metoprolol tartrate (LOPRESSOR) 25 MG tablet Take 25  mg by mouth 2 (two) times daily.  naproxen (NAPROSYN) 500 MG tablet Take 500 mg by mouth 2 (two) times daily with meals.  oxyBUTYnin (DITROPAN) 5 mg tablet TAKE 1 TABLET EVERY 8 HOURS FOR URINARY FREQUENCY/URGENCY  simvastatin (ZOCOR) 10 MG tablet TAKE 1 TABLET IN THE EVENING BY MOUTH 2-3 DAYS A WEEK 90 DAYS  traMADoL (ULTRAM) 50 mg tablet take 1 tablet by mouth three times a day as needed for 30 days  trimethoprim 100 mg tablet   No current facility-administered medications on file prior to visit.   Family History  Problem Relation Age of Onset  Glaucoma Mother  Coronary Artery Disease (Blocked arteries around heart) Father  Breast cancer Sister  Diabetes Neg Hx  High blood pressure (Hypertension) Neg Hx  Anesthesia problems Neg Hx    Social History   Tobacco Use  Smoking Status Never  Smokeless Tobacco Not on file    Social History   Socioeconomic History  Marital status: Married  Tobacco Use  Smoking status: Never  Substance and Sexual Activity  Alcohol use: No  Alcohol/week: 0.0 standard drinks of alcohol  Drug use: No   Objective:   Vitals:  09/20/23 1331  BP: 118/78  Pulse: 82  Temp: 36.9 C (98.4 F)  SpO2: 96%  Weight: 68.9 kg (152 lb)  Height: 156.2 cm (5' 1.5")  PainSc: 0-No pain   Body mass index is 28.26 kg/m.  Gen: No acute distress. Well nourished and well groomed.  Neurological: Alert and  oriented to person, place, and time. Coordination normal.  Head: Normocephalic and atraumatic. Hard of hearing.  Eyes: Conjunctivae are normal. Pupils are equal, round, and reactive to light. No scleral icterus.  Neck: Normal range of motion. Neck supple. No tracheal deviation or thyromegaly present.  Cardiovascular: Normal rate, regular rhythm, normal heart sounds and intact distal pulses. Exam reveals no gallop and no friction rub. No murmur heard. Breast: Breasts are relatively symmetric in size. Moderate ptosis. They both have heterogeneously dense breast  tissue most prominent in the upper outer quadrant. There is no nipple retraction or nipple discharge. No contour changes, no lymphadenopathy appreciated. There is a vaguely palpable region in the upper outer quadrant of the left breast. This is in the midst of some of the denser breast tissue and is difficult to fully appreciate exactly where the cancer is. There is some bruising present as well which may confuse this issue. Respiratory: Effort normal. No respiratory distress. No chest wall tenderness. Breath sounds normal. No wheezes, rales or rhonchi.  GI: Soft. Bowel sounds are normal. The abdomen is soft and nontender. There is no rebound and no guarding.  Musculoskeletal: Normal range of motion. Extremities are nontender.  Lymphadenopathy: No cervical, preauricular, postauricular or axillary adenopathy is present Skin: Skin is warm and dry. No rash noted. No diaphoresis. No erythema. No pallor. No clubbing, cyanosis, or edema.  Psychiatric: Normal mood and affect. Behavior is normal. Judgment and thought content normal.   Labs None recent that are available.   Assessment and Plan:   ICD-10-CM  1. Malignant neoplasm of upper-outer quadrant of left breast in female, estrogen receptor positive (CMS/HHS-HCC) C50.412  Z17.0   2. Family history of breast cancer Z80.3    Patient has a new diagnosis of clinical T1c N0 grade 2 strongly hormone positive left breast cancer. I will refer the patient to medical and radiation oncology as well as genetics. I think because of the age of 14 and the grade 2 nature of the tumor in addition to the prognostic panel, it is reasonable to omit a sentinel lymph node on the patient.  We will plan to do a seed targeted lumpectomy as the mass is not clearly palpable. I reviewed the procedure by which we do that. I discussed that she would preoperatively be evaluated by anesthesia and preop testing, a appointment with the breast center to place a seed 1 to 2 days  before surgery, and the surgery date and time. I advised that I would recommend general anesthesia if that is safe for the patient. I reviewed that we would be making an incision on the breast and taking a section of the breast tissue out incorporating the tumor. I discussed the use of intraoperative specimen imaging to try to minimize her risk of positive margins. I discussed risk of wound complications such as bleeding, infection, seroma, wound breakdown, possible need for additional surgeries or procedures, possible heart or lung complications, possible blood clot. I encouraged the patient to continue being as active as she wanted preoperatively.  I discussed postoperative pain medication as well as postoperative restrictions. I discussed the 1 week restrictions for strenuous activity and lifting. I reviewed that I do not recommend driving for at least about 3 days after surgery. I also discussed showering as being okay starting postop day 2, but no bathing or swimming pools for 2 weeks.  We will do this at the first available opportunity. I discussed that postoperatively the patient may be recommended to receive  radiation and adjuvant hormone treatment. I advised that medical oncology would make the decision regarding the potential for additional testing and that the radiation doctors would definitely want the final pathology from surgery before determining the recommendation for additional treatment.  Patient was accompanied by her husband, daughter, and daughter-in-law.

## 2023-09-29 NOTE — Anesthesia Procedure Notes (Signed)
Procedure Name: LMA Insertion Date/Time: 09/29/2023 3:33 PM  Performed by: Ronnette Hila, CRNAPre-anesthesia Checklist: Patient identified, Emergency Drugs available, Suction available and Patient being monitored Patient Re-evaluated:Patient Re-evaluated prior to induction Oxygen Delivery Method: Circle system utilized Preoxygenation: Pre-oxygenation with 100% oxygen Induction Type: IV induction Ventilation: Mask ventilation without difficulty LMA: LMA inserted LMA Size: 4.0 Number of attempts: 1 Airway Equipment and Method: Bite block Placement Confirmation: positive ETCO2 Tube secured with: Tape Dental Injury: Teeth and Oropharynx as per pre-operative assessment

## 2023-09-30 ENCOUNTER — Encounter (HOSPITAL_BASED_OUTPATIENT_CLINIC_OR_DEPARTMENT_OTHER): Payer: Self-pay | Admitting: General Surgery

## 2023-09-30 NOTE — Anesthesia Postprocedure Evaluation (Signed)
Anesthesia Post Note  Patient: Dorothy Anderson  Procedure(s) Performed: LEFT BREAST SEED LOCALIZED LUMPECTOMY (Left: Breast)     Patient location during evaluation: PACU Anesthesia Type: General Level of consciousness: sedated and patient cooperative Pain management: pain level controlled Vital Signs Assessment: post-procedure vital signs reviewed and stable Respiratory status: spontaneous breathing Cardiovascular status: stable Anesthetic complications: no   No notable events documented.  Last Vitals:  Vitals:   09/29/23 1715 09/29/23 1731  BP: (!) 128/54 (!) 126/56  Pulse: 77 80  Resp: 20 16  Temp:  (!) 36.2 C  SpO2: 95% 98%    Last Pain:  Vitals:   09/30/23 0935  TempSrc:   PainSc: 0-No pain                 Lewie Loron

## 2023-10-01 LAB — SURGICAL PATHOLOGY

## 2023-10-04 ENCOUNTER — Encounter: Payer: Self-pay | Admitting: *Deleted

## 2023-10-04 ENCOUNTER — Encounter: Payer: Self-pay | Admitting: Hematology and Oncology

## 2023-10-05 ENCOUNTER — Telehealth: Payer: Self-pay | Admitting: *Deleted

## 2023-10-05 ENCOUNTER — Ambulatory Visit
Admission: RE | Admit: 2023-10-05 | Discharge: 2023-10-05 | Disposition: A | Discharge status: Home / Self Care | Payer: Medicare Other | Source: Ambulatory Visit | Attending: Obstetrics and Gynecology | Admitting: Obstetrics and Gynecology

## 2023-10-05 ENCOUNTER — Encounter: Payer: Self-pay | Admitting: *Deleted

## 2023-10-05 DIAGNOSIS — E2839 Other primary ovarian failure: Secondary | ICD-10-CM

## 2023-10-05 DIAGNOSIS — M8588 Other specified disorders of bone density and structure, other site: Secondary | ICD-10-CM | POA: Diagnosis not present

## 2023-10-05 DIAGNOSIS — N958 Other specified menopausal and perimenopausal disorders: Secondary | ICD-10-CM | POA: Diagnosis not present

## 2023-10-05 NOTE — Telephone Encounter (Signed)
Left vm for pt daughter to return call to discuss next steps in treatment. Contact information provided.

## 2023-10-06 ENCOUNTER — Encounter: Payer: Self-pay | Admitting: *Deleted

## 2023-10-06 DIAGNOSIS — C50912 Malignant neoplasm of unspecified site of left female breast: Secondary | ICD-10-CM

## 2023-10-19 DIAGNOSIS — C50412 Malignant neoplasm of upper-outer quadrant of left female breast: Secondary | ICD-10-CM | POA: Diagnosis not present

## 2023-10-19 DIAGNOSIS — Z170 Estrogen receptor positive status [ER+]: Secondary | ICD-10-CM | POA: Diagnosis not present

## 2023-10-21 ENCOUNTER — Telehealth: Payer: Self-pay | Admitting: Radiation Oncology

## 2023-10-21 ENCOUNTER — Encounter (HOSPITAL_COMMUNITY): Payer: Self-pay

## 2023-10-21 ENCOUNTER — Encounter: Payer: Self-pay | Admitting: Radiation Oncology

## 2023-10-21 NOTE — Telephone Encounter (Signed)
Left message for patient's daughter to call back to reschedule tomorrow's appointment per voicemail left 12/12.

## 2023-10-21 NOTE — Progress Notes (Signed)
Radiation Oncology         [phone] ________________________________  Initial Outpatient Consultation  Name: Dorothy Anderson MRN: [id number]  Date: 10/22/2023  DOB: 10-03-1941  WU:[id number], Soyla Murphy, MD  Almond Lint, MD   REFERRING PHYSICIAN: Almond Lint, MD  DIAGNOSIS:    ICD-10-CM   1. Malignant neoplasm of upper-outer quadrant of left breast in female, estrogen receptor positive (HCC)  C50.412    Z17.0       Stage IA Left Breast UOQ, Invasive papillary carcinoma with DCIS, ER+ / PR+ / Her2-, Grade 2 pT1c, pN0   CHIEF COMPLAINT: Here to discuss management of left breast cancer  HISTORY OF PRESENT ILLNESS::Dorothy Anderson is a 82 y.o. female who presented with a left breast abnormality on the following imaging: bilateral screening mammogram on the date of 08/31/23.  No symptoms, if any, were reported at that time.  She subsequently presented for a diagnostic left breast mammogram and left breast ultrasound on 09/08/23 which demonstrated a suspicious mass in the 2 o'clock left breast measuring 1.8 cm in the greatest extent, and located 11 cmfn. No abnormal left axillary lymph nodes were demonstrated. A discrete firm mass in the 2 o'clock left breast was also palpated on the physical examination at the time of her diagnostic imaging.   Biopsy of the 2 o'clock left breast (11 cmfn) on date of 09/10/23 showed grade 2 invasive ductal carcinoma measuring 1.3 cm in the greatest linear extent of the sample.  ER status: 100% positive and PR status 30% positive, both with strong staining intensity; Proliferation marker Ki67 at 10%; Her2 status negative; Grade 2. No lymph nodes were examined.   She was accordingly referred to Dr. Donell Beers and opted to proceed with a left breast lumpectomy with nodal biopsies on 09/29/23. Pathology from the procedure revealed: tumor the size of 1.3 cm; histology of grade 2 solid papillary carcinoma with invasion and DCIS; all margin negative for invasive and  in situ carcinoma; margin status to invasive disease of 2.5 mm from the posterior margin; margin status to in situ disease of greater than 10 mm from posterior margin; nodal status of 4/4 non-sentinel lymph nodes negative for carcinoma. ER status: 100% positive and PR status 30% positive, both with strong staining intensity; Proliferation marker Ki67 at 10%; Her2 status negative; Grade 2.  Oncotype DX was obtained on the final surgical sample and the recurrence score of 12 predicts a risk of recurrence outside the breast over the next 9 years of 3%, if the patient's only systemic therapy were to be an antiestrogen for 5 years.  It also predicts no significant benefit from chemotherapy.  Other pertinent imaging performed thus far includes a bone density study on 10/05/23 which classified the patient as osteopenic (low bone mass).   Lymphedema issues, if any:  Denies  -however she does have a seroma which will be drained today  Pain issues, if any:  Denies  SAFETY ISSUES: Prior radiation? No Pacemaker/ICD? No Possible current pregnancy? No--postmenopausal Is the patient on methotrexate? No  Current Complaints / other details:  Patient's daughter is a GYN-oncologist down from [place] to support patient/spouse  PREVIOUS RADIATION THERAPY: No  PAST MEDICAL HISTORY:  has a past medical history of Anxiety, Arthritis (KNEES), Calculus of left kidney, Depression, Diabetes mellitus without complication (HCC), Frequency of urination, GERD (gastroesophageal reflux disease), Glaucoma (BILATERAL EYES), History of kidney stones, Hydronephrosis, left, Hyperglycemia, Hypertension, Nocturia, Pneumonia, and Wears glasses.    PAST SURGICAL HISTORY: Past Surgical History:  Procedure Laterality Date   BREAST BIOPSY Left 09/10/2023   [place] LT BREAST BX W LOC DEV 1ST LESION IMG BX SPEC US GUIDE 09/10/2023 [facility]   BREAST BIOPSY  09/27/2023   MM LT RADIOACTIVE SEED LOC MAMMO GUIDE 09/27/2023 [facility]    BREAST LUMPECTOMY WITH RADIOACTIVE SEED LOCALIZATION Left 09/29/2023   Procedure: LEFT BREAST SEED LOCALIZED LUMPECTOMY;  Surgeon: Almond Lint, MD;  Location: [facility];  Service: General;  Laterality: Left;   CARPAL TUNNEL RELEASE  06-17-2005   RIGHT   CATARACT EXTRACTION W/ INTRAOCULAR LENS  IMPLANT, BILATERAL     CYSTO/ LEFT RETROGRADE PYELOGRAM/ LEFT URETERAL STENT PLACEMENT  04-15-2005   URETERAL STONE   CYSTOSCOPY/RETROGRADE/URETEROSCOPY  01/27/2012   Procedure: CYSTOSCOPY/RETROGRADE/URETEROSCOPY;  Surgeon: Marcine Matar, MD;  Location[facility];  Service: Urology;  Laterality: Right;   EXTRACORPOREAL SHOCK WAVE LITHOTRIPSY  11-09-11   RIGHT   TONSILLECTOMY AND ADENOIDECTOMY  AGE 47   TOTAL KNEE ARTHROPLASTY  09-29-2005   RIGHT   TOTAL KNEE ARTHROPLASTY Left 01/17/2013   Procedure: TOTAL KNEE ARTHROPLASTY;  Surgeon: Cammy Copa, MD;  [facility];  Service: Orthopedics;  Laterality: Left;  Left Total Knee Arthroplasty    FAMILY HISTORY: family history includes CVA in her mother; [name]  SOCIAL HISTORY:  reports that she has never smoked. She has never used smokeless tobacco. She reports that she does not drink alcohol and does not use drugs.  ALLERGIES: Amiloride, Atorvastatin calcium, Emetrol, Macrobid [nitrofurantoin monohydrate macrocrystals], and Propoxyphene  MEDICATIONS:  Current Outpatient Medications  Medication Sig Dispense Refill   hydrochlorothiazide (HYDRODIURIL) 25 MG tablet Take 25 mg by mouth daily.     magnesium oxide (MAG-OX) 400 MG tablet Take 400 mg by mouth daily.     sertraline (ZOLOFT) 100 MG tablet Take 100 mg by mouth at bedtime.     calcium carbonate (SUPER CALCIUM) 1500 (600 Ca) MG TABS tablet 1 tablet with meals     cholecalciferol (VITAMIN D3) 25 MCG (1000 UNIT) tablet 1 tablet     famotidine (PEPCID) 20 MG tablet Take 20 mg by mouth See admin instructions. At bedtime every other  night - alternating with Prevacid     HYDROcodone-acetaminophen (NORCO/VICODIN) 5-325 MG tablet Take 1 tablet by mouth every 6 (six) hours as needed for moderate pain (pain score 4-6). 20 tablet 0   Lancets (ONETOUCH DELICA PLUS LANCET33G) MISC U 1 LANCET ONCE A DAY     lansoprazole (PREVACID) 15 MG capsule Take 15 mg by mouth daily before breakfast.     latanoprost (XALATAN) 0.005 % ophthalmic solution 1 drop into both eyes in the evening     metFORMIN (GLUCOPHAGE-XR) 500 MG 24 hr tablet Take 500 mg by mouth 2 (two) times daily with a meal.     metoprolol tartrate (LOPRESSOR) 25 MG tablet Take 1 tablet by mouth 2 (two) times daily with a meal.     ONETOUCH VERIO test strip U ONCE A DAY     oxybutynin (DITROPAN) 5 MG tablet 1 tablet     simvastatin (ZOCOR) 10 MG tablet 1 tablet in the evening     traMADol (ULTRAM) 50 MG tablet 1 tablet as needed     No current facility-administered medications for this encounter.    REVIEW OF SYSTEMS: As above in HPI.   PHYSICAL EXAM:  height is 5' 3.5" (1.613 m) and weight is 152 lb 9.6 oz (69.2 kg). Her temperature is 97.6  F (36.4 C). Her blood pressure is 134/57 (abnormal) and her pulse is 73. Her respiration is 20 and oxygen saturation is 96%.   General: Alert and oriented, in no acute distress HEENT: Head is normocephalic. Extraocular movements are intact.   Tongue is midline, no thrush on tongue.  She is hard of hearing Heart: Regular in rate and rhythm with no murmurs, rubs, or gallops. Chest: Clear to auscultation bilaterally, with no rhonchi, wheezes, or rales. Abdomen: Soft, nontender, nondistended, with no rigidity or guarding. Extremities: No cyanosis or edema. Skin: No concerning lesions.  No rashes over breasts. Musculoskeletal: Ambulatory.  Well-nourished. Neurologic:  No obvious focalities. Speech is fluent.  Psychiatric: Judgment and insight are intact. Affect is appropriate. Breasts: Lumpectomy scar healing well on lateral left breast  with underlying seroma.    ECOG = 1  0 - Asymptomatic (Fully active, able to carry on all predisease activities without restriction)  1 - Symptomatic but completely ambulatory (Restricted in physically strenuous activity but ambulatory and able to carry out work of a light or sedentary nature. For example, light housework, office work)  2 - Symptomatic, <50% in bed during the day (Ambulatory and capable of all self care but unable to carry out any work activities. Up and about more than 50% of waking hours)  3 - Symptomatic, >50% in bed, but not bedbound (Capable of only limited self-care, confined to bed or chair 50% or more of waking hours)  4 - Bedbound (Completely disabled. Cannot carry on any self-care. Totally confined to bed or chair)  5 - Death   Santiago Glad MM, Creech RH, Tormey DC, et al. [phone]). "Toxicity and response criteria of the [facility]". Am. Evlyn Clines. Oncol. 5 (6): 649-55   LABORATORY DATA:  Lab Results  Component Value Date   WBC 9.7 02/07/2021   HGB 12.0 02/07/2021   HCT 36.8 02/07/2021   MCV 103.1 (H) 02/07/2021   PLT 408 (H) 02/07/2021   CMP     Component Value Date/Time   NA 138 02/07/2021 1129   K 4.4 02/07/2021 1129   CL 106 02/07/2021 1129   CO2 25 02/07/2021 1129   GLUCOSE 125 (H) 02/07/2021 1129   BUN 20 02/07/2021 1129   CREATININE 0.90 02/07/2021 1129   CALCIUM 9.3 02/07/2021 1129   PROT 5.9 (L) 04/14/2019 0623   ALBUMIN 3.5 04/14/2019 0623   AST 26 04/14/2019 0623   ALT 13 04/14/2019 0623   ALKPHOS 50 04/14/2019 0623   BILITOT 1.4 (H) 04/14/2019 0623   GFRNONAA >60 02/07/2021 1129   GFRAA 48 (L) 04/14/2019 0623         RADIOGRAPHY: DG BONE DENSITY (DXA) Result Date: 10/05/2023 EXAM: DUAL X-RAY ABSORPTIOMETRY (DXA) FOR BONE MINERAL DENSITY IMPRESSION: Referring Physician:  Carrington Clamp Your patient completed a bone mineral density test using GE Lunar iDXA system (analysis version: 16). Technologist: BEC PATIENT:  Name: Natali, Neubeck Patient ID: [id number] Birth Date: [birth year] Height: 62.0 in. Sex: Female Measured: 10/05/2023 Weight: 151.0 lbs. Indications: Advanced Age, Caucasian, Diabetic non insulin, Estrogen Deficient, Height Loss (781.91), osteoarthritis, Postmenopausal, Zoloft Fractures: NONE Treatments: Vitamin D (E933.5) ASSESSMENT: The BMD measured at Femur Neck Right is 0.754 g/cm2 with a T-score of -2.0. This patient is considered osteopenic/low bone mass according to World Health Organization [facility]) criteria. L2 & L3 were excluded due to degenerative changes. The quality of the exam is good. Site Region Measured Date Measured Age YA BMD Significant CHANGE T-score DualFemur Neck Right 10/05/2023  83.8 -2.0 0.754 g/cm2 AP Spine L1-L4 (L2,L3) 10[age]05/2023 83.8 -0.4 1.111 g/cm2 DualFemur Total Mean 10/05/2023 83.8 -1.8 0.787 g/cm2 Left Forearm Radius 33% 10/05/2023 83.8 -1.9 0.708 g/cm2 World Health Organization [facility]) criteria for post-menopausal, Caucasian Women: Normal       T-score at or above -1 SD Osteopenia   T-score between -1 and -2.5 SD Osteoporosis T-score at or below -2.5 SD RECOMMENDATION: 1. All patients should optimize calcium and vitamin D intake. 2. Consider FDA-approved medical therapies in postmenopausal women and men aged 75 years and older, based on the following: a. A hip or vertebral (clinical or morphometric) fracture. b. T-score = -2.5 at the femoral neck or spine after appropriate evaluation to exclude secondary causes. c. Low bone mass (T-score between -1.0 and -2.5 at the femoral neck or spine) and a 10-year probability of a hip fracture = 3% or a 10-year probability of a major osteoporosis-related fracture = 20% based on the [place]-adapted WHO algorithm. d. Clinician judgment and/or patient preferences may indicate treatment for people with 10-year fracture probabilities above or below these levels. FOLLOW-UP: Patients with diagnosis of osteoporosis or at high risk for fracture should have  regular bone mineral density tests. Patients eligible for Medicare are allowed routine testing every 2 years. The testing frequency can be increased to one year for patients who have rapidly progressing disease, are receiving or discontinuing medical therapy to restore bone mass, or have additional risk factors. I have reviewed this study and agree with the findings. [facility] Electronically Signed   By: Frederico Hamman M.D.   On: 10/05/2023 15:10   MM Breast Surgical Specimen Result Date: 09/29/2023 CLINICAL DATA:  Status post seed localized LEFT lumpectomy. EXAM: SPECIMEN RADIOGRAPH OF THE LEFT BREAST COMPARISON:  Previous exam(s). FINDINGS: Status post excision of the left breast. The radioactive seed and coil shaped biopsy marker clip are present and intact. The findings are discussed with the operating room nurse. IMPRESSION: Specimen radiograph of the left breast. Electronically Signed   By: Norva Pavlov M.D.   On: 09/29/2023 16:19   MM LT RADIOACTIVE SEED LOC MAMMO GUIDE Result Date: 09/27/2023 CLINICAL DATA:  Radioactive seed localization prior to surgery. EXAM: MAMMOGRAPHIC GUIDED RADIOACTIVE SEED LOCALIZATION OF THE LEFT BREAST COMPARISON:  Previous exam(s). FINDINGS: Patient presents for radioactive seed localization prior to surgery. I met with the patient and we discussed the procedure of seed localization including benefits and alternatives. We discussed the high likelihood of a successful procedure. We discussed the risks of the procedure including infection, bleeding, tissue injury and further surgery. We discussed the low dose of radioactivity involved in the procedure. Informed, written consent was given. The usual time-out protocol was performed immediately prior to the procedure. Using mammographic guidance, sterile technique, 1% lidocaine and an I-125 radioactive seed, the coil shaped clip was localized using a lateral to medial approach. The follow-up mammogram  images confirm the seed in the expected location and were marked for Dr. Donell Beers. Follow-up survey of the patient confirms presence of the radioactive seed. Order number of I-125 seed:  [id number]. Total activity:  0.265 millicuries reference Date: 08/25/2023 The patient tolerated the procedure well and was [facility] from the [facility]. She was given instructions regarding seed removal. IMPRESSION: Radioactive seed localization left breast. No apparent complications. Electronically Signed   By: Baird Lyons M.D.   On: 09/27/2023 15:05      IMPRESSION/PLAN:   Stage I Left Breast Cancer, ER+  For the patient's early stage favorable  risk breast cancer, we had a thorough discussion about her options for adjuvant therapy. One option would be antiestrogen therapy as discussed with medical oncology. She would take a pill for approximately 5 years. Alternative option is radiation therapy to the L breast. The most aggressive option would be to pursue both radiation and the pill adjuvantly.  Of note, she does has history of bone density issues.    We discussed the risks benefits and side effects of radiotherapy. The side effects would likely include some skin irritation and fatigue during the weeks of radiation. There is a risk of late effects which include but are not necessarily limited to cosmetic changes and rare lung toxicity. I would anticipate delivering approximately 1-3 weeks of radiotherapy    After a thorough discussion of standard hypofractionation over 3  weeks, we discussed 1 week of ultra hypofractionated radiation therapy. I discussed that this approach is a bit less standard than longer regimens.  The Fast Forward trial (1 week) method has shown a slight increase in normal tissue side effects over the 3 week hypofractionation standard at 5 years of follow-up. This includes effects like induration, and edema.  However, for elderly patients that are keen on the convenience of minimizing the number of  procedures/visits to the cancer center, this is a nevertheless a reasonable approach to consider and the vast majority of patients do very well. There is also an option of RT once weekly for 5 weeks that we discussed.    We spoke about other general acute effects of breast radiation including skin irritation and fatigue as well as breast fibrosis, induration, and /or swelling long term, and much less common late effects including internal organ injury or irritation. We spoke about the latest technology that is used to minimize the risk of late effects for patients undergoing radiotherapy to the breast or chest wall. No guarantees of treatment were given. Consent signed today.  She will first meet w/ Dr Pamelia Hoit before she decides upon her preferred treatment option.    Her daughter has our contact info and I asked her to call after they make their decision.  Seroma drainage to take place today.   On date of service, in total, I spent 60 minutes on this encounter. Patient was seen in person.   __________________________________________   Lonie Peak, MD  This document serves as a record of services personally performed by Lonie Peak, MD. It was created on her behalf by Neena Rhymes, a trained medical scribe. The creation of this record is based on the scribe's personal observations and the provider's statements to them. This document has been checked and approved by the attending provider.

## 2023-10-22 ENCOUNTER — Ambulatory Visit: Payer: Medicare Other

## 2023-10-22 ENCOUNTER — Inpatient Hospital Stay: Payer: Medicare Other | Attending: Genetic Counselor | Admitting: Genetic Counselor

## 2023-10-22 ENCOUNTER — Ambulatory Visit: Payer: Medicare Other | Admitting: Radiation Oncology

## 2023-10-22 ENCOUNTER — Encounter: Payer: Self-pay | Admitting: Genetic Counselor

## 2023-10-22 ENCOUNTER — Encounter: Payer: Self-pay | Admitting: Radiation Oncology

## 2023-10-22 ENCOUNTER — Other Ambulatory Visit: Payer: Self-pay | Admitting: Genetic Counselor

## 2023-10-22 ENCOUNTER — Ambulatory Visit
Admission: RE | Admit: 2023-10-22 | Discharge: 2023-10-22 | Disposition: A | Discharge status: Home / Self Care | Payer: Medicare Other | Source: Ambulatory Visit | Attending: Radiation Oncology | Admitting: Radiation Oncology

## 2023-10-22 VITALS — BP 134/57 | HR 73 | Temp 97.6°F | Resp 20 | Ht 63.5 in | Wt 152.6 lb

## 2023-10-22 DIAGNOSIS — Z7984 Long term (current) use of oral hypoglycemic drugs: Secondary | ICD-10-CM | POA: Diagnosis not present

## 2023-10-22 DIAGNOSIS — R609 Edema, unspecified: Secondary | ICD-10-CM | POA: Insufficient documentation

## 2023-10-22 DIAGNOSIS — Z79899 Other long term (current) drug therapy: Secondary | ICD-10-CM | POA: Diagnosis not present

## 2023-10-22 DIAGNOSIS — M8580 Other specified disorders of bone density and structure, unspecified site: Secondary | ICD-10-CM | POA: Insufficient documentation

## 2023-10-22 DIAGNOSIS — Z923 Personal history of irradiation: Secondary | ICD-10-CM | POA: Insufficient documentation

## 2023-10-22 DIAGNOSIS — C50412 Malignant neoplasm of upper-outer quadrant of left female breast: Secondary | ICD-10-CM | POA: Insufficient documentation

## 2023-10-22 DIAGNOSIS — E119 Type 2 diabetes mellitus without complications: Secondary | ICD-10-CM | POA: Insufficient documentation

## 2023-10-22 DIAGNOSIS — Z803 Family history of malignant neoplasm of breast: Secondary | ICD-10-CM | POA: Insufficient documentation

## 2023-10-22 DIAGNOSIS — Z79811 Long term (current) use of aromatase inhibitors: Secondary | ICD-10-CM | POA: Insufficient documentation

## 2023-10-22 DIAGNOSIS — K219 Gastro-esophageal reflux disease without esophagitis: Secondary | ICD-10-CM | POA: Insufficient documentation

## 2023-10-22 DIAGNOSIS — Z170 Estrogen receptor positive status [ER+]: Secondary | ICD-10-CM | POA: Diagnosis not present

## 2023-10-22 DIAGNOSIS — I10 Essential (primary) hypertension: Secondary | ICD-10-CM | POA: Insufficient documentation

## 2023-10-22 NOTE — Progress Notes (Signed)
REFERRING PROVIDER: Serena Croissant, MD [address] [place],  [place] [address]  PRIMARY PROVIDER:  Lorenda Ishihara, MD  PRIMARY REASON FOR VISIT:  1. Family history of breast cancer   2. Malignant neoplasm of upper-outer quadrant of left breast in female, estrogen receptor positive (HCC)      HISTORY OF PRESENT ILLNESS:   Dorothy Anderson, a 82 y.o. female, was seen for a [place] cancer genetics consultation at the request of Dr. Pamelia Hoit due to a personal and family history of breast cancer.  Dorothy Anderson presents to clinic today to discuss the possibility of a hereditary predisposition to cancer, genetic testing, and to further clarify her future cancer risks, as well as potential cancer risks for family members.   In November 2024, at the age of 6, Dorothy Anderson was diagnosed with Invasive ductal carcinoma of the left breast. The treatment plan included lumpectomy and radiation.  Her sister had negative genetic testing on an 9 gene panel through [facility] and her sister's daughter had genetic testing on a 13 gene STAT panel through [facility].    CANCER HISTORY:  Oncology History   No history exists.    Past Medical History:  Diagnosis Date   Anxiety    Arthritis KNEES   OA   Calculus of left kidney    Depression    Diabetes mellitus without complication (HCC)    PRE DIABETIC ORAL MEDS    Family history of breast cancer    Frequency of urination    GERD (gastroesophageal reflux disease)    Glaucoma BILATERAL EYES   History of kidney stones    Hydronephrosis, left    DR. STEVEN DAHLSTADT   Hyperglycemia    Hypertension    Nocturia    Pneumonia    Wears glasses     Past Surgical History:  Procedure Laterality Date   BREAST BIOPSY Left 09/10/2023   [place] LT BREAST BX W LOC DEV 1ST LESION IMG BX SPEC US GUIDE 09/10/2023 [facility]   BREAST BIOPSY  09/27/2023   MM LT RADIOACTIVE SEED LOC MAMMO GUIDE 09/27/2023 [facility]   BREAST LUMPECTOMY WITH  RADIOACTIVE SEED LOCALIZATION Left 09/29/2023   Procedure: LEFT BREAST SEED LOCALIZED LUMPECTOMY;  Surgeon: Almond Lint, MD;  Location: [facility];  Service: General;  Laterality: Left;   CARPAL TUNNEL RELEASE  06-17-2005   RIGHT   CATARACT EXTRACTION W/ INTRAOCULAR LENS  IMPLANT, BILATERAL     CYSTO/ LEFT RETROGRADE PYELOGRAM/ LEFT URETERAL STENT PLACEMENT  04-15-2005   URETERAL STONE   CYSTOSCOPY/RETROGRADE/URETEROSCOPY  01/27/2012   Procedure: CYSTOSCOPY/RETROGRADE/URETEROSCOPY;  Surgeon: Marcine Matar, MD;  Location: [facility];  Service: Urology;  Laterality: Right;   EXTRACORPOREAL SHOCK WAVE LITHOTRIPSY  11-09-11   RIGHT   TONSILLECTOMY AND ADENOIDECTOMY  AGE 35   TOTAL KNEE ARTHROPLASTY  09-29-2005   RIGHT   TOTAL KNEE ARTHROPLASTY Left 01/17/2013   Procedure: TOTAL KNEE ARTHROPLASTY;  Surgeon: Cammy Copa, MD;  Location: [facility];  Service: Orthopedics;  Laterality: Left;  Left Total Knee Arthroplasty    Social History   Socioeconomic History   Marital status: Married    Spouse name: Not on file   Number of children: Not on file   Years of education: Not on file   Highest education level: Not on file  Occupational History   Not on file  Tobacco Use   Smoking status: Never   Smokeless tobacco: Never  Vaping Use   Vaping status: Never  Used  Substance and Sexual Activity   Alcohol use: No   Drug use: No   Sexual activity: Not Currently    Birth control/protection: Post-menopausal  Other Topics Concern   Not on file  Social History Narrative   Not on file   Social Drivers of Health   Financial Resource Strain: Not on file  Food Insecurity: Not on file  Transportation Needs: Not on file  Physical Activity: Not on file  Stress: Not on file  Social Connections: Not on file     FAMILY HISTORY:  We obtained a detailed, 4-generation family history.  Significant diagnoses are listed below: Family History  Problem Relation Age  of Onset   CVA Mother    Heart attack Father    Breast cancer Sister 51   Breast cancer Niece 19     The patient has three daughters and a son who are cancer free.  Her brother died at age 35 and her sister is living and had breast cancer at 24.  Her daughter also had breast cancer with negative genetic testing.  Both parents are deceased.  There is no reported family history of cancer on either side of this large family.  Dorothy Anderson is aware of previous family history of genetic testing for hereditary cancer risks.  There is no reported Ashkenazi Jewish ancestry. There is no known consanguinity.  GENETIC COUNSELING ASSESSMENT: Dorothy Anderson is a 82 y.o. female with a personal and family history of cancer which is somewhat suggestive of a hereditary cancer syndrome and predisposition to cancer given that three women in the family had breast cancer, although two other affected individuals tested negative for any hereditary genetic mutations. We, therefore, discussed and recommended the following at today's visit.   DISCUSSION: We discussed that, in general, most cancer is not inherited in families, but instead is sporadic or familial. Sporadic cancers occur by chance and typically happen at older ages (>50 years) as this type of cancer is caused by genetic changes acquired during an individual's lifetime. Some families have more cancers than would be expected by chance; however, the ages or types of cancer are not consistent with a known genetic mutation or known genetic mutations have been ruled out. This type of familial cancer is thought to be due to a combination of multiple genetic, environmental, hormonal, and lifestyle factors. While this combination of factors likely increases the risk of cancer, the exact source of this risk is not currently identifiable or testable.  We discussed that 5 - 10% of breast cancer is hereditary, with most cases associated with BRCA mutations.  There are other genes  that can be associated with hereditary breast cancer syndromes.  These include ATM, CHEK2 and PALB2.    We reviewed the characteristics, features and inheritance patterns of hereditary cancer syndromes. We also discussed genetic testing, including the appropriate family members to test, the process of testing, insurance coverage and turn-around-time for results. We discussed that genetic testing was performed in the two other women in the family who had breast cancer and their testing was negative.  These women were younger in their age of onset of breast cancer and therefore informative for the family.  Despite that Dorothy Anderson meets NCCN testing criteria, the likelihood that she will be positive is low. We discussed the implications of a negative, positive, carrier and/or variant of uncertain significant result. Dorothy Anderson  was offered a common hereditary cancer panel (36+ genes) and an expanded pan-cancer panel (70+ genes).  Dorothy Anderson was informed of the benefits and limitations of each panel, including that expanded pan-cancer panels contain genes that do not have clear management guidelines at this point in time.  We also discussed that as the number of genes included on a panel increases, the chances of variants of uncertain significance increases. Dorothy Anderson decided to pursue genetic testing for the CancerNext-Expanded+RNAinsight gene panel.   The CancerNext-Expanded gene panel offered by [facility] and includes sequencing, rearrangement, and RNA analysis for the following 76 genes: AIP, ALK, APC, ATM, AXIN2, BAP1, BARD1, BMPR1A, BRCA1, BRCA2, BRIP1, CDC73, CDH1, CDK4, CDKN1B, CDKN2A, CEBPA, CHEK2, CTNNA1, DDX41, DICER1, ETV6, FH, FLCN, GATA2, LZTR1, MAX, MBD4, MEN1, MET, MLH1, MSH2, MSH3, MSH6, MUTYH, NF1, NF2, NTHL1, PALB2, PHOX2B, PMS2, POT1, PRKAR1A, PTCH1, PTEN, RAD51C, RAD51D, RB1, RET, RUNX1, SDHA, SDHAF2, SDHB, SDHC, SDHD, SMAD4, SMARCA4, SMARCB1, SMARCE1, STK11, SUFU, TMEM127, TP53, TSC1, TSC2, VHL,  and WT1 (sequencing and deletion/duplication); EGFR, HOXB13, KIT, MITF, PDGFRA, POLD1, and POLE (sequencing only); EPCAM and GREM1 (deletion/duplication only).    Based on Dorothy Anderson's personal and family history of cancer, she meets medical criteria for genetic testing. Despite that she meets criteria, she may still have an out of pocket cost. We discussed that if her out of pocket cost for testing is over $100, the laboratory will call and confirm whether she wants to proceed with testing.  If the out of pocket cost of testing is less than $100 she will be billed by the genetic testing laboratory.   We discussed that some people do not want to undergo genetic testing due to fear of genetic discrimination.  The Genetic Information Nondiscrimination Act (GINA) was signed into federal law in 2008. GINA prohibits health insurers and most employers from discriminating against individuals based on genetic information (including the results of genetic tests and family history information). According to GINA, health insurance companies cannot consider genetic information to be a preexisting condition, nor can they use it to make decisions regarding coverage or rates. GINA also makes it illegal for most employers to use genetic information in making decisions about hiring, firing, promotion, or terms of employment. It is important to note that GINA does not offer protections for life insurance, disability insurance, or long-term care insurance. GINA does not apply to those in the [employer], those who work for companies with less than 15 employees, and new life insurance or long-term disability insurance policies.  Health status due to a cancer diagnosis is not protected under GINA. More information about GINA can be found by visiting [email or web address].   PLAN: After considering the risks, benefits, and limitations, Dorothy Anderson provided informed consent to pursue genetic testing.  Blood will be drawn on Monday, September 25, 2023 and the blood sample will be sent to [facility] for analysis of the CancerNext-Expanded+RNAinsight. Results should be available within approximately 2-3 weeks' time, at which point they will be disclosed by telephone to Dorothy Anderson, as will any additional recommendations warranted by these results. Dorothy Anderson will receive a summary of her genetic counseling visit and a copy of her results once available. This information will also be available in Epic.   Lastly, we encouraged Dorothy Anderson to remain in contact with cancer genetics annually so that we can continuously update the family history and inform her of any changes in cancer genetics and testing that may be of benefit for this family.   Dorothy Anderson questions were answered to her satisfaction today. Our contact information was provided  should additional questions or concerns arise. Thank you for the referral and allowing [place] to share in the care of your patient.   Cattie Tineo P. Lowell Guitar, MS, [facility] Licensed, Patent attorney [email or web address] phone: [phone]  The patient was seen for a total of 30 minutes in face-to-face genetic counseling.  The patient brought her daughter and husband. Drs. Meliton Rattan, and/or [place] were available for questions, if needed..    _______________________________________________________________________ For Office Staff:  Number of people involved in session: 3 Was an Intern/ student involved with case: no

## 2023-10-22 NOTE — Progress Notes (Signed)
Location of Breast Cancer:  Malignant neoplasm of upper-outer quadrant of left breast in female, estrogen receptor positive   Histology per Pathology Report:  09/29/2023 A. 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 LEFT BREAST, ADDITIONAL INFERIOR MARGIN, EXCISION: Benign breast with stromal fibrosis, adenosis and fibromatoid change Microcalcifications present Negative for carcinoma C. LEFT BREAST, ADDITIONAL LATERAL MARGIN, EXCISION: Benign fibroadipose Negative for mammary ducts and lobules Negative for carcinoma D. LEFT BREAST, ADDITIONAL MEDIAL MARGIN, EXCISION: Benign breast with fibrocystic changes including stromal fibrosis and adenosis Negative for carcinoma E. LEFT BREAST, ADDITIONAL SUPERIOR, MARGIN, EXCISION: Benign fibroadipose Negative for mammary ducts and lobules Negative for carcinoma F. LEFT BREAST, ADDITIONAL ANTERIOR MARGIN, EXCISION: Benign breast, predominantly mature adipose Negative for carcinoma G. LEFT BREAST, ADDITIONAL POSTERIOR MARGIN, EXCISION: Benign fibroadipose Negative for mammary ducts and lobules Negative for carcinoma Three benign lymph nodes, negative for carcinoma (0/3) Calcifications present within vessel walls   Receptor Status: ER(100%), PR (30%), Her2-neu (Negative), Ki-67(10%)  Did patient  present with symptoms (if so, please note symptoms) or was this found on screening mammography?: Screening mammogram  Past/Anticipated interventions by surgeon, if any:  09/29/2023 --Dr. Almond Lint  Left Breast Radioactive seed localized lumpectomy  Past/Anticipated interventions by medical oncology, if any:  Meeting with Dr. Serena Croissant later today  Lymphedema issues, if any:  Denies    Pain issues, if any:  Denies  SAFETY ISSUES: Prior radiation? No Pacemaker/ICD? No Possible current pregnancy? No--postmenopausal Is the patient on methotrexate? No  Current Complaints / other details:  Patient's daughter is a GYN-oncologist down from [place] to support patient/spouse

## 2023-10-25 ENCOUNTER — Inpatient Hospital Stay (HOSPITAL_BASED_OUTPATIENT_CLINIC_OR_DEPARTMENT_OTHER): Payer: Medicare Other | Admitting: Hematology and Oncology

## 2023-10-25 ENCOUNTER — Inpatient Hospital Stay: Payer: Medicare Other

## 2023-10-25 VITALS — BP 109/47 | HR 62 | Temp 98.2°F | Resp 18 | Ht 63.5 in | Wt 153.3 lb

## 2023-10-25 DIAGNOSIS — Z923 Personal history of irradiation: Secondary | ICD-10-CM | POA: Diagnosis not present

## 2023-10-25 DIAGNOSIS — Z170 Estrogen receptor positive status [ER+]: Secondary | ICD-10-CM | POA: Diagnosis not present

## 2023-10-25 DIAGNOSIS — C50412 Malignant neoplasm of upper-outer quadrant of left female breast: Secondary | ICD-10-CM

## 2023-10-25 DIAGNOSIS — Z79811 Long term (current) use of aromatase inhibitors: Secondary | ICD-10-CM | POA: Diagnosis not present

## 2023-10-25 DIAGNOSIS — Z803 Family history of malignant neoplasm of breast: Secondary | ICD-10-CM | POA: Diagnosis not present

## 2023-10-25 LAB — GENETIC SCREENING ORDER

## 2023-10-25 MED ORDER — ANASTROZOLE 1 MG PO TABS: 1.0000 mg | ORAL_TABLET | Freq: Every day | ORAL | 3 refills | Status: DC

## 2023-10-25 NOTE — Assessment & Plan Note (Addendum)
09/29/2023: Left lumpectomy: Solid papillary carcinoma with invasion showing extracellular mucin, grade 2, 1.3 cm, margins negative, negative for angiolymphatic invasion, 0/4 lymph node negative, ER 100%, PR 30%, HER2 0 negative, Ki-67 10%  Pathology and radiology counseling: Discussed with the patient, the details of pathology including the type of breast cancer,the  staging, the significance of ER, PR and HER-2/neu receptors and the implications for treatment. After reviewing the pathology in detail, we proceeded to discuss the different treatment options between   radiation, chemotherapy, antiestrogen therapies.  Treatment plan: +/- Adjuvant radiation Adjuvant antiestrogen therapy with anastrozole 1 mg daily x 5 years  Based on favorable prognostic features and her age we decided not to do Oncotype testing.

## 2023-10-25 NOTE — Progress Notes (Signed)
[facility] CONSULT NOTE  Patient Care Team: Lorenda Ishihara, MD as PCP - General (Internal Medicine) Pershing Proud, RN as Oncology Nurse Navigator Donnelly Angelica, RN as Oncology Nurse Navigator Serena Croissant, MD as Consulting Physician (Hematology and Oncology)  CHIEF COMPLAINTS/PURPOSE OF CONSULTATION:  Newly diagnosed breast cancer  HISTORY OF PRESENTING ILLNESS:    History of Present Illness   The patient, with a recent history of invasive papillary breast cancer, presents for a follow-up consultation after surgery. The cancer was detected during a routine screening mammogram, leading to further investigations including an ultrasound and biopsy. The patient underwent surgery to remove the cancer, which was measured at 1.3 centimeters. The patient's cancer was estrogen receptor positive and progesterone receptor positive, indicating a more favorable prognosis. The HER2 receptor was negative, suggesting a less aggressive form of cancer. The cancer was graded as intermediate, indicating it was not very aggressive. The KI-67 proliferation marker was at 10%, suggesting a slow-growing type of breast cancer. The patient's cancer was staged as 1A, the earliest stage of cancer. The patient also developed a seroma at the surgical site, which has been drained a couple of times.         I reviewed her records extensively and collaborated the history with the patient.  SUMMARY OF ONCOLOGIC HISTORY: Oncology History  Malignant neoplasm of upper-outer quadrant of left breast in female, estrogen receptor positive (HCC)  10/22/2023 Initial Diagnosis   Malignant neoplasm of upper-outer quadrant of left breast in female, estrogen receptor positive (HCC)   10/25/2023 Cancer Staging   Staging form: Breast, AJCC 8th Edition - Clinical: Stage IA (cT1c, cN0, cM0, G2, ER+, PR+, HER2-) - Signed by Serena Croissant, MD on 10/25/2023 Stage prefix: Initial diagnosis Histologic grading  system: 3 grade system      MEDICAL HISTORY:  Past Medical History:  Diagnosis Date   Anxiety    Arthritis KNEES   OA   Calculus of left kidney    Depression    Diabetes mellitus without complication (HCC)    PRE DIABETIC ORAL MEDS    Family history of breast cancer    Frequency of urination    GERD (gastroesophageal reflux disease)    Glaucoma BILATERAL EYES   History of kidney stones    Hydronephrosis, left    DR. STEVEN DAHLSTADT   Hyperglycemia    Hypertension    Nocturia    Pneumonia    Wears glasses     SURGICAL HISTORY: Past Surgical History:  Procedure Laterality Date   BREAST BIOPSY Left 09/10/2023   [place] LT BREAST BX W LOC DEV 1ST LESION IMG BX SPEC US GUIDE 09/10/2023 [facility]   BREAST BIOPSY  09/27/2023   MM LT RADIOACTIVE SEED LOC MAMMO GUIDE 09/27/2023 [facility]   BREAST LUMPECTOMY WITH RADIOACTIVE SEED LOCALIZATION Left 09/29/2023   Procedure: LEFT BREAST SEED LOCALIZED LUMPECTOMY;  Surgeon: Almond Lint, MD;  Location: [facility];  Service: General;  Laterality: Left;   CARPAL TUNNEL RELEASE  06-17-2005   RIGHT   CATARACT EXTRACTION W/ INTRAOCULAR LENS  IMPLANT, BILATERAL     CYSTO/ LEFT RETROGRADE PYELOGRAM/ LEFT URETERAL STENT PLACEMENT  04-15-2005   URETERAL STONE   CYSTOSCOPY/RETROGRADE/URETEROSCOPY  01/27/2012   Procedure: CYSTOSCOPY/RETROGRADE/URETEROSCOPY;  Surgeon: Marcine Matar, MD;  Location: [facility];  Service: Urology;  Laterality: Right;   EXTRACORPOREAL SHOCK WAVE LITHOTRIPSY  11-09-11   RIGHT   TONSILLECTOMY AND ADENOIDECTOMY  AGE 55   TOTAL KNEE ARTHROPLASTY  09-29-2005   RIGHT   TOTAL KNEE ARTHROPLASTY Left 01/17/2013   Procedure: TOTAL KNEE ARTHROPLASTY;  Surgeon: Cammy Copa, MD;  Location: [facility];  Service: Orthopedics;  Laterality: Left;  Left Total Knee Arthroplasty    SOCIAL HISTORY: Social History   Socioeconomic History   Marital status: Married    Spouse name:  Not on file   Number of children: Not on file   Years of education: Not on file   Highest education level: Not on file  Occupational History   Not on file  Tobacco Use   Smoking status: Never   Smokeless tobacco: Never  Vaping Use   Vaping status: Never Used  Substance and Sexual Activity   Alcohol use: No   Drug use: No   Sexual activity: Not Currently    Birth control/protection: Post-menopausal  Other Topics Concern   Not on file  Social History Narrative   Not on file   Social Drivers of Health   Financial Resource Strain: Not on file  Food Insecurity: Not on file  Transportation Needs: Not on file  Physical Activity: Not on file  Stress: Not on file  Social Connections: Not on file  Intimate Partner Violence: Not on file    FAMILY HISTORY: Family History  Problem Relation Age of Onset   CVA Mother    Heart attack Father    Breast cancer Sister 20   Breast cancer Niece 69    ALLERGIES:  is allergic to amiloride, atorvastatin calcium, emetrol, macrobid [nitrofurantoin monohydrate macrocrystals], and propoxyphene.  MEDICATIONS:  Current Outpatient Medications  Medication Sig Dispense Refill   anastrozole (ARIMIDEX) 1 MG tablet Take 1 tablet (1 mg total) by mouth daily. 90 tablet 3   calcium carbonate (SUPER CALCIUM) 1500 (600 Ca) MG TABS tablet 1 tablet with meals     cholecalciferol (VITAMIN D3) 25 MCG (1000 UNIT) tablet 1 tablet     famotidine (PEPCID) 20 MG tablet Take 20 mg by mouth See admin instructions. At bedtime every other night - alternating with Prevacid     hydrochlorothiazide (HYDRODIURIL) 25 MG tablet Take 25 mg by mouth daily.     HYDROcodone-acetaminophen (NORCO/VICODIN) 5-325 MG tablet Take 1 tablet by mouth every 6 (six) hours as needed for moderate pain (pain score 4-6). 20 tablet 0   Lancets (ONETOUCH DELICA PLUS LANCET33G) MISC U 1 LANCET ONCE A DAY     lansoprazole (PREVACID) 15 MG capsule Take 15 mg by mouth daily before breakfast.      latanoprost (XALATAN) 0.005 % ophthalmic solution 1 drop into both eyes in the evening     magnesium oxide (MAG-OX) 400 MG tablet Take 400 mg by mouth daily.     metFORMIN (GLUCOPHAGE-XR) 500 MG 24 hr tablet Take 500 mg by mouth 2 (two) times daily with a meal.     metoprolol tartrate (LOPRESSOR) 25 MG tablet Take 1 tablet by mouth 2 (two) times daily with a meal.     ONETOUCH VERIO test strip U ONCE A DAY     oxybutynin (DITROPAN) 5 MG tablet 1 tablet     sertraline (ZOLOFT) 100 MG tablet Take 100 mg by mouth at bedtime.     simvastatin (ZOCOR) 10 MG tablet 1 tablet in the evening     traMADol (ULTRAM) 50 MG tablet 1 tablet as needed     No current facility-administered medications for this visit.    REVIEW OF SYSTEMS:   Constitutional: Denies fevers, chills or abnormal night sweats  All other systems were reviewed with the patient and are negative.  PHYSICAL EXAMINATION: ECOG PERFORMANCE STATUS: 2 - Symptomatic, <50% confined to bed  Vitals:   10/25/23 1143  BP: (!) 109/47  Pulse: 62  Resp: 18  Temp: 98.2 F (36.8 C)  SpO2: 100%   Filed Weights   10/25/23 1143  Weight: 153 lb 4.8 oz (69.5 kg)    GENERAL:alert, no distress and comfortable    LABORATORY DATA:  I have reviewed the data as listed Lab Results  Component Value Date   WBC 9.7 02/07/2021   HGB 12.0 02/07/2021   HCT 36.8 02/07/2021   MCV 103.1 (H) 02/07/2021   PLT 408 (H) 02/07/2021   Lab Results  Component Value Date   NA 138 02/07/2021   K 4.4 02/07/2021   CL 106 02/07/2021   CO2 25 02/07/2021    RADIOGRAPHIC STUDIES: I have personally reviewed the radiological reports and agreed with the findings in the report.  ASSESSMENT AND PLAN:  Malignant neoplasm of upper-outer quadrant of left breast in female, estrogen receptor positive (HCC) 09/29/2023: Left lumpectomy: Solid papillary carcinoma with invasion showing extracellular mucin, grade 2, 1.3 cm, margins negative, negative for angiolymphatic  invasion, 0/4 lymph node negative, ER 100%, PR 30%, HER2 0 negative, Ki-67 10%  Pathology and radiology counseling: Discussed with the patient, the details of pathology including the type of breast cancer,the  staging, the significance of ER, PR and HER-2/neu receptors and the implications for treatment. After reviewing the pathology in detail, we proceeded to discuss the different treatment options between   radiation, chemotherapy, antiestrogen therapies.  Treatment plan: +/- Adjuvant radiation Adjuvant antiestrogen therapy with anastrozole 1 mg daily x 5 years  After discussing the pros and cons of both of these options patient decided to forego radiation and will pursue antiestrogen therapy with anastrozole.  Seroma at surgical site Drained multiple times post lumpectomy. -Expect resolution over time.  General Health Maintenance / Followup Plans -Next mammogram to be scheduled in 3 months. -Follow-up visit in 3 months to discuss for SCP visit with Mardella Layman Her daughter is again a Loss adjuster, chartered from [place] and she accompanied her today.   All questions were answered. The patient knows to call the clinic with any problems, questions or concerns.    Tamsen Meek, MD 10/25/23

## 2023-10-26 ENCOUNTER — Encounter: Payer: Self-pay | Admitting: *Deleted

## 2023-10-26 DIAGNOSIS — C50412 Malignant neoplasm of upper-outer quadrant of left female breast: Secondary | ICD-10-CM

## 2023-11-11 ENCOUNTER — Encounter: Payer: Self-pay | Admitting: Genetic Counselor

## 2023-11-11 ENCOUNTER — Telehealth: Payer: Self-pay | Admitting: Genetic Counselor

## 2023-11-11 ENCOUNTER — Ambulatory Visit: Payer: Self-pay | Admitting: Genetic Counselor

## 2023-11-11 DIAGNOSIS — Z1379 Encounter for other screening for genetic and chromosomal anomalies: Secondary | ICD-10-CM

## 2023-11-11 DIAGNOSIS — C50412 Malignant neoplasm of upper-outer quadrant of left female breast: Secondary | ICD-10-CM

## 2023-11-11 NOTE — Progress Notes (Signed)
 HPI:  Dorothy Anderson was previously seen in the [facility] due to a personal and family history of breast cancer and concerns regarding a hereditary predisposition to cancer. Please refer to our prior cancer genetics clinic note for more information regarding our discussion, assessment and recommendations, at the time. Dorothy Anderson recent genetic test results were disclosed to her, as were recommendations warranted by these results. These results and recommendations are discussed in more detail below.  CANCER HISTORY:  Oncology History  Malignant neoplasm of upper-outer quadrant of left breast in female, estrogen receptor positive (HCC)  10/22/2023 Initial Diagnosis   Malignant neoplasm of upper-outer quadrant of left breast in female, estrogen receptor positive (HCC)   10/25/2023 Cancer Staging   Staging form: Breast, AJCC 8th Edition - Clinical: Stage IA (cT1c, cN0, cM0, G2, ER+, PR+, HER2-) - Signed by Odean Potts, MD on 10/25/2023 Stage prefix: Initial diagnosis Histologic grading system: 3 grade system   11/10/2023 Genetic Testing   Negative genetic testing on the CancerNext-Expanded+RNAinsight panel.  The report date is November 10, 2023.  The CancerNext-Expanded gene panel offered by [facility] and includes sequencing, rearrangement, and RNA analysis for the following 76 genes: AIP, ALK, APC, ATM, AXIN2, BAP1, BARD1, BMPR1A, BRCA1, BRCA2, BRIP1, CDC73, CDH1, CDK4, CDKN1B, CDKN2A, CEBPA, CHEK2, CTNNA1, DDX41, DICER1, ETV6, FH, FLCN, GATA2, LZTR1, MAX, MBD4, MEN1, MET, MLH1, MSH2, MSH3, MSH6, MUTYH, NF1, NF2, NTHL1, PALB2, PHOX2B, PMS2, POT1, PRKAR1A, PTCH1, PTEN, RAD51C, RAD51D, RB1, RET, RUNX1, SDHA, SDHAF2, SDHB, SDHC, SDHD, SMAD4, SMARCA4, SMARCB1, SMARCE1, STK11, SUFU, TMEM127, TP53, TSC1, TSC2, VHL, and WT1 (sequencing and deletion/duplication); EGFR, HOXB13, KIT, MITF, PDGFRA, POLD1, and POLE (sequencing only); EPCAM and GREM1 (deletion/duplication only).        FAMILY HISTORY:  We obtained a detailed, 4-generation family history.  Significant diagnoses are listed below: Family History  Problem Relation Age of Onset   CVA Mother    Heart attack Father    Breast cancer Sister 67   Breast cancer Niece 30       The patient has three daughters and a son who are cancer free.  Her brother died at age 88 and her sister is living and had breast cancer at 102.  Her daughter also had breast cancer with negative genetic testing.  Both parents are deceased.   There is no reported family history of cancer on either side of this large family.   Dorothy Anderson is aware of previous family history of genetic testing for hereditary cancer risks.  There is no reported Ashkenazi Jewish ancestry. There is no known consanguinity  GENETIC TEST RESULTS: Genetic testing reported out on November 10, 2023 through the CancerNext-Expanded+RNAinsight cancer panel found no pathogenic mutations. The CancerNext-Expanded gene panel offered by [facility] and includes sequencing, rearrangement, and RNA analysis for the following 76 genes: AIP, ALK, APC, ATM, AXIN2, BAP1, BARD1, BMPR1A, BRCA1, BRCA2, BRIP1, CDC73, CDH1, CDK4, CDKN1B, CDKN2A, CEBPA, CHEK2, CTNNA1, DDX41, DICER1, ETV6, FH, FLCN, GATA2, LZTR1, MAX, MBD4, MEN1, MET, MLH1, MSH2, MSH3, MSH6, MUTYH, NF1, NF2, NTHL1, PALB2, PHOX2B, PMS2, POT1, PRKAR1A, PTCH1, PTEN, RAD51C, RAD51D, RB1, RET, RUNX1, SDHA, SDHAF2, SDHB, SDHC, SDHD, SMAD4, SMARCA4, SMARCB1, SMARCE1, STK11, SUFU, TMEM127, TP53, TSC1, TSC2, VHL, and WT1 (sequencing and deletion/duplication); EGFR, HOXB13, KIT, MITF, PDGFRA, POLD1, and POLE (sequencing only); EPCAM and GREM1 (deletion/duplication only). The test report has been scanned into EPIC and is located under the Molecular Pathology section of the Results Review tab.  A portion of the result report  is included below for reference.     We discussed with Dorothy Anderson that because current genetic testing is not  perfect, it is possible there may be a gene mutation in one of these genes that current testing cannot detect, but that chance is small. We also discussed, that there could be another gene that has not yet been discovered, or that we have not yet tested, that is responsible for the cancer diagnoses in the family. It is also possible there is a hereditary cause for the cancer in the family that Dorothy Anderson did not inherit and therefore was not identified in her testing.  Therefore, it is important to remain in touch with cancer genetics in the future so that we can continue to offer Ms. Funari the most up to date genetic testing.   ADDITIONAL GENETIC TESTING: We discussed with Dorothy Anderson that her genetic testing was fairly extensive.  If there are genes identified to increase cancer risk that can be analyzed in the future, we would be happy to discuss and coordinate this testing at that time.    CANCER SCREENING RECOMMENDATIONS: Dorothy Anderson test result is considered negative (normal).  This means that we have not identified a hereditary cause for her personal and family history of cancer at this time. Most cancers happen by chance and this negative test suggests that her personal and family history of cancer may fall into this category.    Possible reasons for Dorothy Anderson's negative genetic test include:  1. There may be a gene mutation in one of these genes that current testing methods cannot detect but that chance is small.  2. There could be another gene that has not yet been discovered, or that we have not yet tested, that is responsible for the cancer diagnoses in the family.  3.  There may be no hereditary risk for cancer in the family. The cancers in Dorothy Anderson and/or her family may be sporadic/familial or due to other genetic and environmental factors. 4. It is also possible there is a hereditary cause for the cancer in the family that Dorothy Anderson did not inherit. However, her sister and her niece who both had  breast cancer, also tested negative.  Therefore, it is recommended she continue to follow the cancer management and screening guidelines provided by her oncology and primary healthcare provider. An individual's cancer risk and medical management are not determined by genetic test results alone. Overall cancer risk assessment incorporates additional factors, including personal medical history, family history, and any available genetic information that may result in a personalized plan for cancer prevention and surveillance  RECOMMENDATIONS FOR FAMILY MEMBERS:  Individuals in this family might be at some increased risk of developing cancer, over the general population risk, simply due to the family history of cancer.  We recommended women in this family have a yearly mammogram beginning at age 76, or 25 years younger than the earliest onset of cancer, an annual clinical breast exam, and perform monthly breast self-exams. Women in this family should also have a gynecological exam as recommended by their primary provider. All family members should be referred for colonoscopy starting at age 74, or 12 years younger than the earliest onset of cancer.  FOLLOW-UP: Lastly, we discussed with Dorothy Anderson that cancer genetics is a rapidly advancing field and it is possible that new genetic tests will be appropriate for her and/or her family members in the future. We encouraged her to remain in contact with cancer genetics  on an annual basis so we can update her personal and family histories and let her know of advances in cancer genetics that may benefit this family.   Our contact number was provided. Dorothy Anderson questions were answered to her satisfaction, and she knows she is welcome to call us  at anytime with additional questions or concerns.   Darice Monte, MS, [facility] Licensed, Certified Genetic Counselor [email or web address]

## 2023-11-11 NOTE — Telephone Encounter (Signed)
Revealed negative genetic testing.  Discussed that we do not know why she has breast cancer or why there is cancer in the family. It could be due to a different gene that we are not testing, or maybe our current technology may not be able to pick something up.  It will be important for her to keep in contact with genetics to keep up with whether additional testing may be needed. 

## 2023-12-08 DIAGNOSIS — H02885 Meibomian gland dysfunction left lower eyelid: Secondary | ICD-10-CM | POA: Diagnosis not present

## 2023-12-08 DIAGNOSIS — H401134 Primary open-angle glaucoma, bilateral, indeterminate stage: Secondary | ICD-10-CM | POA: Diagnosis not present

## 2023-12-08 DIAGNOSIS — E119 Type 2 diabetes mellitus without complications: Secondary | ICD-10-CM | POA: Diagnosis not present

## 2023-12-08 DIAGNOSIS — H18593 Other hereditary corneal dystrophies, bilateral: Secondary | ICD-10-CM | POA: Diagnosis not present

## 2023-12-08 DIAGNOSIS — H26493 Other secondary cataract, bilateral: Secondary | ICD-10-CM | POA: Diagnosis not present

## 2023-12-08 DIAGNOSIS — H02882 Meibomian gland dysfunction right lower eyelid: Secondary | ICD-10-CM | POA: Diagnosis not present

## 2023-12-08 DIAGNOSIS — H3581 Retinal edema: Secondary | ICD-10-CM | POA: Diagnosis not present

## 2023-12-21 ENCOUNTER — Ambulatory Visit
Admission: RE | Admit: 2023-12-21 | Discharge: 2023-12-21 | Disposition: A | Discharge status: Home / Self Care | Payer: Medicare Other | Source: Ambulatory Visit | Attending: Physician Assistant | Admitting: Physician Assistant

## 2023-12-21 ENCOUNTER — Other Ambulatory Visit: Payer: Self-pay | Admitting: Physician Assistant

## 2023-12-21 DIAGNOSIS — R519 Headache, unspecified: Secondary | ICD-10-CM | POA: Diagnosis not present

## 2023-12-21 DIAGNOSIS — W19XXXA Unspecified fall, initial encounter: Secondary | ICD-10-CM | POA: Diagnosis not present

## 2023-12-21 DIAGNOSIS — N3000 Acute cystitis without hematuria: Secondary | ICD-10-CM | POA: Diagnosis not present

## 2023-12-21 DIAGNOSIS — R8271 Bacteriuria: Secondary | ICD-10-CM | POA: Diagnosis not present

## 2023-12-21 DIAGNOSIS — S022XXA Fracture of nasal bones, initial encounter for closed fracture: Secondary | ICD-10-CM | POA: Diagnosis not present

## 2023-12-21 DIAGNOSIS — M7989 Other specified soft tissue disorders: Secondary | ICD-10-CM | POA: Diagnosis not present

## 2023-12-24 ENCOUNTER — Ambulatory Visit (INDEPENDENT_AMBULATORY_CARE_PROVIDER_SITE_OTHER): Payer: Medicare Other | Admitting: Otolaryngology

## 2023-12-24 ENCOUNTER — Encounter (INDEPENDENT_AMBULATORY_CARE_PROVIDER_SITE_OTHER): Payer: Self-pay

## 2023-12-24 VITALS — BP 100/60 | HR 70 | Ht 61.5 in | Wt 150.0 lb

## 2023-12-24 DIAGNOSIS — S022XXA Fracture of nasal bones, initial encounter for closed fracture: Secondary | ICD-10-CM | POA: Diagnosis not present

## 2023-12-26 DIAGNOSIS — S022XXA Fracture of nasal bones, initial encounter for closed fracture: Secondary | ICD-10-CM | POA: Insufficient documentation

## 2023-12-26 NOTE — Progress Notes (Signed)
Patient ID: Dorothy Anderson, female   DOB: [birth year], 83 y.o.   MRN: [id number]  CC: Nasal fractures  HPI:  Dorothy Anderson is a 83 y.o. female who presents today for evaluation of her facial trauma and nasal fractures.  According to the patient, she had an accidental fall 3 days ago.  She hit her face on the ground.  It resulted in significant facial trauma, with nasal pain and bilateral facial edema and ecchymosis.  Her facial x-ray showed minimally displaced fractures at the tip of the nasal bone.  The patient complains of nasal congestion.  However, she has no visual change or facial weakness/numbness.  She underwent adenotonsillectomy surgery in the past.  She has no other ENT surgery.  Past Medical History:  Diagnosis Date   Anxiety    Arthritis KNEES   OA   Calculus of left kidney    Depression    Diabetes mellitus without complication (HCC)    PRE DIABETIC ORAL MEDS    Family history of breast cancer    Frequency of urination    GERD (gastroesophageal reflux disease)    Glaucoma BILATERAL EYES   History of kidney stones    Hydronephrosis, left    DR. STEVEN DAHLSTADT   Hyperglycemia    Hypertension    Nocturia    Pneumonia    Wears glasses     Past Surgical History:  Procedure Laterality Date   BREAST BIOPSY Left 09/10/2023   [place] LT BREAST BX W LOC DEV 1ST LESION IMG BX SPEC US GUIDE 09/10/2023 [facility]   BREAST BIOPSY  09/27/2023   MM LT RADIOACTIVE SEED LOC MAMMO GUIDE 09/27/2023 [facility]   BREAST LUMPECTOMY WITH RADIOACTIVE SEED LOCALIZATION Left 09/29/2023   Procedure: LEFT BREAST SEED LOCALIZED LUMPECTOMY;  Surgeon: Almond Lint, MD;  Location: [facility];  Service: General;  Laterality: Left;   CARPAL TUNNEL RELEASE  06-17-2005   RIGHT   CATARACT EXTRACTION W/ INTRAOCULAR LENS  IMPLANT, BILATERAL     CYSTO/ LEFT RETROGRADE PYELOGRAM/ LEFT URETERAL STENT PLACEMENT  04-15-2005   URETERAL STONE   CYSTOSCOPY/RETROGRADE/URETEROSCOPY   01/27/2012   Procedure: CYSTOSCOPY/RETROGRADE/URETEROSCOPY;  Surgeon: Marcine Matar, MD;  Location: [facility];  Service: Urology;  Laterality: Right;   EXTRACORPOREAL SHOCK WAVE LITHOTRIPSY  11-09-11   RIGHT   TONSILLECTOMY AND ADENOIDECTOMY  AGE 25   TOTAL KNEE ARTHROPLASTY  09-29-2005   RIGHT   TOTAL KNEE ARTHROPLASTY Left 01/17/2013   Procedure: TOTAL KNEE ARTHROPLASTY;  Surgeon: Cammy Copa, MD;  Location: [facility];  Service: Orthopedics;  Laterality: Left;  Left Total Knee Arthroplasty    Family History  Problem Relation Age of Onset   CVA Mother    Heart attack Father    Breast cancer Sister 13   Breast cancer Niece 8    Social History:  reports that she has never smoked. She has never used smokeless tobacco. She reports that she does not drink alcohol and does not use drugs.  Allergies:  Allergies  Allergen Reactions   Amiloride Other (See Comments)    High blood level of potassium   Atorvastatin Calcium Other (See Comments)    Muscle weakness and pain   Emetrol     Other reaction(s): Unknown   Macrobid [Nitrofurantoin Monohydrate Macrocrystals] Nausea And Vomiting   Propoxyphene Other (See Comments)    MADE PT STAY AWAKE    Prior to Admission medications   Medication Sig Start Date End Date Taking? Authorizing  Provider  anastrozole (ARIMIDEX) 1 MG tablet Take 1 tablet (1 mg total) by mouth daily. 10/25/23  Yes Serena Croissant, MD  calcium carbonate (SUPER CALCIUM) 1500 (600 Ca) MG TABS tablet 1 tablet with meals   Yes [provider]  cholecalciferol (VITAMIN D3) 25 MCG (1000 UNIT) tablet 1 tablet   Yes [provider]  famotidine (PEPCID) 20 MG tablet Take 20 mg by mouth See admin instructions. At bedtime every other night - alternating with Prevacid 03/07/20  Yes [provider]  hydrochlorothiazide (HYDRODIURIL) 25 MG tablet Take 25 mg by mouth daily. 11/03/17  Yes [provider]  HYDROcodone-acetaminophen  (NORCO/VICODIN) 5-325 MG tablet Take 1 tablet by mouth every 6 (six) hours as needed for moderate pain (pain score 4-6). 09/29/23  Yes Almond Lint, MD  Lancets [facility] DELICA PLUS Roland) MISC U 1 LANCET ONCE A DAY 05/03/19  Yes [provider]  lansoprazole (PREVACID) 15 MG capsule Take 15 mg by mouth daily before breakfast.   Yes [provider]  latanoprost (XALATAN) 0.005 % ophthalmic solution 1 drop into both eyes in the evening   Yes [provider]  magnesium oxide (MAG-OX) 400 MG tablet Take 400 mg by mouth daily. 03/01/17  Yes [provider]  metFORMIN (GLUCOPHAGE-XR) 500 MG 24 hr tablet Take 500 mg by mouth 2 (two) times daily with a meal.   Yes [provider]  metoprolol tartrate (LOPRESSOR) 25 MG tablet Take 1 tablet by mouth 2 (two) times daily with a meal.   Yes [provider]  ONETOUCH VERIO test strip U ONCE A DAY 05/03/19  Yes [provider]  oxybutynin (DITROPAN) 5 MG tablet 1 tablet   Yes [provider]  sertraline (ZOLOFT) 100 MG tablet Take 100 mg by mouth at bedtime. 10/18/23  Yes [provider]  simvastatin (ZOCOR) 10 MG tablet 1 tablet in the evening 10/24/21  Yes [provider]  traMADol (ULTRAM) 50 MG tablet 1 tablet as needed   Yes [provider]    Blood pressure 100/60, pulse 70, height 5' 1.5" (1.562 m), weight 150 lb (68 kg), SpO2 98%. Exam: General: Communicates without difficulty, well nourished, no acute distress. Face/sinus: Bilateral facial ecchymosis and edema.  The nasal dorsum is edematous.  Facial movement is normal and symmetric. Eyes: PERRL, EOMI. No scleral icterus, conjunctivae clear. Neuro: CN II exam reveals vision grossly intact.  No nystagmus at any point of gaze. Ears: Auricles well formed without lesions.  Ear canals are intact without mass or lesion.  No erythema or edema is appreciated.  The TMs are intact without fluid. Nose: External  evaluation reveals nasal dorsal edema.  No significant deformity is noted.  Anterior rhinoscopy reveals congested mucosa over anterior aspect of inferior turbinates and intact septum.  No bleeding noted. Oral:  Oral cavity and oropharynx are intact, symmetric, without erythema or edema.  Mucosa is moist without lesions. Neck: Full range of motion without pain.  There is no significant lymphadenopathy.  No masses palpable.  Thyroid bed within normal limits to palpation.  Parotid glands and submandibular glands equal bilaterally without mass.  Trachea is midline. Neuro:  CN 2-12 grossly intact.   Assessment: 1.  Bilateral minimally displaced nasal bone fractures. 2.  No significant nasal or facial deformity is noted.  Plan: 1.  The physical exam findings are reviewed with the patient. 2.  Based on the above findings, no surgical intervention is recommended at this time. 3.  Cold compresses  for edema control. 4.  The patient is encouraged to call with any questions or concerns.  Breyer Tejera W Seve Monette 12/26/2023, 3:01 PM

## 2024-01-10 DIAGNOSIS — R8271 Bacteriuria: Secondary | ICD-10-CM | POA: Diagnosis not present

## 2024-01-10 DIAGNOSIS — N3020 Other chronic cystitis without hematuria: Secondary | ICD-10-CM | POA: Diagnosis not present

## 2024-01-25 ENCOUNTER — Encounter: Payer: Self-pay | Admitting: Podiatry

## 2024-01-25 ENCOUNTER — Encounter: Payer: Self-pay | Admitting: *Deleted

## 2024-01-25 ENCOUNTER — Ambulatory Visit (INDEPENDENT_AMBULATORY_CARE_PROVIDER_SITE_OTHER): Admitting: Podiatry

## 2024-01-25 ENCOUNTER — Encounter: Payer: Medicare Other | Admitting: Adult Health

## 2024-01-25 VITALS — Ht 61.5 in | Wt 150.0 lb

## 2024-01-25 DIAGNOSIS — M79675 Pain in left toe(s): Secondary | ICD-10-CM

## 2024-01-25 DIAGNOSIS — M79674 Pain in right toe(s): Secondary | ICD-10-CM

## 2024-01-25 DIAGNOSIS — M2041 Other hammer toe(s) (acquired), right foot: Secondary | ICD-10-CM

## 2024-01-25 DIAGNOSIS — M2042 Other hammer toe(s) (acquired), left foot: Secondary | ICD-10-CM | POA: Diagnosis not present

## 2024-01-25 DIAGNOSIS — B351 Tinea unguium: Secondary | ICD-10-CM | POA: Diagnosis not present

## 2024-01-25 DIAGNOSIS — E119 Type 2 diabetes mellitus without complications: Secondary | ICD-10-CM | POA: Diagnosis not present

## 2024-01-31 ENCOUNTER — Encounter: Payer: Self-pay | Admitting: *Deleted

## 2024-01-31 NOTE — Progress Notes (Signed)
 ANNUAL DIABETIC FOOT EXAM  Subjective: Dorothy Anderson presents today for annual diabetic foot exam. She has been diagnosed with breast cancer since her last visit. Chief Complaint  Patient presents with   Nail Problem    Pt is here for [facility] unsure of last A1C PCP is Dr Chales Salmon and LOV was a few weeks ago.   Patient confirms h/o diabetes.  Patient denies any h/o foot wounds.  Dorothy Ishihara, MD is patient's PCP.  Past Medical History:  Diagnosis Date   Anxiety    Arthritis KNEES   OA   Calculus of left kidney    Depression    Diabetes mellitus without complication (HCC)    PRE DIABETIC ORAL MEDS    Family history of breast cancer    Frequency of urination    GERD (gastroesophageal reflux disease)    Glaucoma BILATERAL EYES   History of kidney stones    Hydronephrosis, left    DR. STEVEN DAHLSTADT   Hyperglycemia    Hypertension    Nocturia    Pneumonia    Wears glasses    Patient Active Problem List   Diagnosis Date Noted   Nasal bones, closed fracture 12/26/2023   Genetic testing 11/11/2023   Malignant neoplasm of upper-outer quadrant of left breast in female, estrogen receptor positive (HCC) 10/22/2023   Family history of breast cancer    Chronic kidney disease, stage 2 (mild) 04/22/2021   Degeneration of lumbar intervertebral disc 04/22/2021   Osteopenia 04/22/2021   Abnormal findings on diagnostic imaging of other specified body structures 10/26/2020   Body mass index (BMI) 29.0-29.9, adult 10/26/2020   Esophageal reflux 10/26/2020   Overactive bladder 10/26/2020   History of colonic polyps 10/26/2020   Ptosis of both eyelids 01/28/2015   Diabetes type 2, controlled (HCC) 10/25/2014   High blood pressure 10/25/2014   Osteoarthritis of left knee 01/17/2013    Class: Diagnosis of   Osteoarthrosis involving lower leg 01/17/2013   Past Surgical History:  Procedure Laterality Date   BREAST BIOPSY Left 09/10/2023   [place] LT BREAST BX W LOC DEV 1ST  LESION IMG BX SPEC US GUIDE 09/10/2023 [facility]   BREAST BIOPSY  09/27/2023   MM LT RADIOACTIVE SEED LOC MAMMO GUIDE 09/27/2023 [facility]   BREAST LUMPECTOMY WITH RADIOACTIVE SEED LOCALIZATION Left 09/29/2023   Procedure: LEFT BREAST SEED LOCALIZED LUMPECTOMY;  Surgeon: Almond Lint, MD;  Location: [facility];  Service: General;  Laterality: Left;   CARPAL TUNNEL RELEASE  06-17-2005   RIGHT   CATARACT EXTRACTION W/ INTRAOCULAR LENS  IMPLANT, BILATERAL     CYSTO/ LEFT RETROGRADE PYELOGRAM/ LEFT URETERAL STENT PLACEMENT  04-15-2005   URETERAL STONE   CYSTOSCOPY/RETROGRADE/URETEROSCOPY  01/27/2012   Procedure: CYSTOSCOPY/RETROGRADE/URETEROSCOPY;  Surgeon: Marcine Matar, MD;  Location: [facility];  Service: Urology;  Laterality: Right;   EXTRACORPOREAL SHOCK WAVE LITHOTRIPSY  11-09-11   RIGHT   TONSILLECTOMY AND ADENOIDECTOMY  AGE 45   TOTAL KNEE ARTHROPLASTY  09-29-2005   RIGHT   TOTAL KNEE ARTHROPLASTY Left 01/17/2013   Procedure: TOTAL KNEE ARTHROPLASTY;  Surgeon: Cammy Copa, MD;  Location: [facility] OR;  Service: Orthopedics;  Laterality: Left;  Left Total Knee Arthroplasty   Current Outpatient Medications on File Prior to Visit  Medication Sig Dispense Refill   anastrozole (ARIMIDEX) 1 MG tablet Take 1 tablet (1 mg total) by mouth daily. 90 tablet 3   calcium carbonate (SUPER CALCIUM) 1500 (600 Ca) MG TABS tablet 1  tablet with meals     cholecalciferol (VITAMIN D3) 25 MCG (1000 UNIT) tablet 1 tablet     famotidine (PEPCID) 20 MG tablet Take 20 mg by mouth See admin instructions. At bedtime every other night - alternating with Prevacid     hydrochlorothiazide (HYDRODIURIL) 25 MG tablet Take 25 mg by mouth daily.     HYDROcodone-acetaminophen (NORCO/VICODIN) 5-325 MG tablet Take 1 tablet by mouth every 6 (six) hours as needed for moderate pain (pain score 4-6). 20 tablet 0   Lancets (ONETOUCH DELICA PLUS LANCET33G) MISC U 1 LANCET ONCE A  DAY     lansoprazole (PREVACID) 15 MG capsule Take 15 mg by mouth daily before breakfast.     latanoprost (XALATAN) 0.005 % ophthalmic solution 1 drop into both eyes in the evening     magnesium oxide (MAG-OX) 400 MG tablet Take 400 mg by mouth daily.     metFORMIN (GLUCOPHAGE-XR) 500 MG 24 hr tablet Take 500 mg by mouth 2 (two) times daily with a meal.     metoprolol tartrate (LOPRESSOR) 25 MG tablet Take 1 tablet by mouth 2 (two) times daily with a meal.     ONETOUCH VERIO test strip U ONCE A DAY     oxybutynin (DITROPAN) 5 MG tablet 1 tablet     sertraline (ZOLOFT) 100 MG tablet Take 100 mg by mouth at bedtime.     simvastatin (ZOCOR) 10 MG tablet 1 tablet in the evening     traMADol (ULTRAM) 50 MG tablet 1 tablet as needed     No current facility-administered medications on file prior to visit.    Allergies  Allergen Reactions   Amiloride Other (See Comments)    High blood level of potassium   Atorvastatin Calcium Other (See Comments)    Muscle weakness and pain   Emetrol     Other reaction(s): Unknown   Macrobid [Nitrofurantoin Monohydrate Macrocrystals] Nausea And Vomiting   Propoxyphene Other (See Comments)    MADE PT STAY AWAKE   Social History   Occupational History   Not on file  Tobacco Use   Smoking status: Never   Smokeless tobacco: Never  Vaping Use   Vaping status: Never Used  Substance and Sexual Activity   Alcohol use: No   Drug use: No   Sexual activity: Not Currently    Birth control/protection: Post-menopausal   Family History  Problem Relation Age of Onset   CVA Mother    Heart attack Father    Breast cancer Sister 54   Breast cancer Niece 68   Immunization History  Administered Date(s) Administered   Influenza-Unspecified 08/09/2013   Zoster Recombinant(Shingrix) 11/25/2018     Review of Systems: Negative except as noted in the HPI.   Objective: There were no vitals filed for this visit.  Dorothy Anderson is a pleasant 83 y.o. female in  NAD. AAO X 3.  Diabetic foot exam was performed with the following findings:   Vascular Examination: Capillary refill time immediate b/l. Vascular status intact b/l with palpable DP pulses; faintly palpable PT pulses. Pedal hair diminished b/l. No edema. No pain with calf compression b/l. Skin temperature gradient WNL b/l. No cyanosis or clubbing noted b/l. No ischemia or gangrene b/l. Varicosities present b/l.  Neurological Examination: Sensation grossly intact b/l with 10 gram monofilament. Vibratory sensation intact b/l.   Dermatological Examination: Pedal skin with normal turgor, texture and tone b/l.  No open wounds. No interdigital macerations.   Toenails 1-5 b/l thick,  discolored, elongated with subungual debris and pain on dorsal palpation.   No corns, calluses nor porokeratotic lesions noted.  Musculoskeletal Examination: Muscle strength 5/5 to all lower extremity muscle groups bilaterally. Hammertoe(s) 2-5 left foot and 2-5 right foot.  Radiographs: None     ADA Risk Categorization: Low Risk :  Patient has all of the following: Intact protective sensation No prior foot ulcer  No severe deformity Pedal pulses present  Assessment: 1. Pain due to onychomycosis of toenails of both feet   2. Hammer toes of both feet   3. Controlled type 2 diabetes mellitus without complication, without long-term current use of insulin (HCC)   4. Encounter for diabetic foot exam (HCC)      Plan: Diabetic foot examination performed today. All patient's and/or POA's questions/concerns addressed on today's visit. Toenails 1-5 debrided in length and girth without incident. Continue foot and shoe inspections daily. Monitor blood glucose per PCP/Endocrinologist's recommendations. Continue soft, supportive shoe gear daily. Report any pedal injuries to medical professional. Call office if there are any questions/concerns. -Patient/POA to call should there be question/concern in the interim. Return  in about 3 months (around 04/26/2024).  Freddie Breech, DPM      [place] LOCATION: [address], [place] [address]                   Office [phone]   [facility] LOCATION: [address] [place], [place] [address] Office [phone]

## 2024-02-05 ENCOUNTER — Telehealth: Payer: Self-pay | Admitting: Hematology and Oncology

## 2024-02-05 NOTE — Telephone Encounter (Signed)
Spoke with patient husband confirming upcoming appointment  

## 2024-02-07 DIAGNOSIS — Z170 Estrogen receptor positive status [ER+]: Secondary | ICD-10-CM | POA: Diagnosis not present

## 2024-02-07 DIAGNOSIS — Z0000 Encounter for general adult medical examination without abnormal findings: Secondary | ICD-10-CM | POA: Diagnosis not present

## 2024-02-07 DIAGNOSIS — E1169 Type 2 diabetes mellitus with other specified complication: Secondary | ICD-10-CM | POA: Diagnosis not present

## 2024-02-07 DIAGNOSIS — I10 Essential (primary) hypertension: Secondary | ICD-10-CM | POA: Diagnosis not present

## 2024-02-07 DIAGNOSIS — Z1331 Encounter for screening for depression: Secondary | ICD-10-CM | POA: Diagnosis not present

## 2024-02-07 DIAGNOSIS — C50412 Malignant neoplasm of upper-outer quadrant of left female breast: Secondary | ICD-10-CM | POA: Diagnosis not present

## 2024-02-07 DIAGNOSIS — Z23 Encounter for immunization: Secondary | ICD-10-CM | POA: Diagnosis not present

## 2024-02-07 DIAGNOSIS — R54 Age-related physical debility: Secondary | ICD-10-CM | POA: Diagnosis not present

## 2024-02-07 DIAGNOSIS — M85859 Other specified disorders of bone density and structure, unspecified thigh: Secondary | ICD-10-CM | POA: Diagnosis not present

## 2024-02-07 DIAGNOSIS — G894 Chronic pain syndrome: Secondary | ICD-10-CM | POA: Diagnosis not present

## 2024-02-07 DIAGNOSIS — F32A Depression, unspecified: Secondary | ICD-10-CM | POA: Diagnosis not present

## 2024-02-07 DIAGNOSIS — Z79899 Other long term (current) drug therapy: Secondary | ICD-10-CM | POA: Diagnosis not present

## 2024-02-07 DIAGNOSIS — K219 Gastro-esophageal reflux disease without esophagitis: Secondary | ICD-10-CM | POA: Diagnosis not present

## 2024-02-21 DIAGNOSIS — R188 Other ascites: Secondary | ICD-10-CM | POA: Diagnosis not present

## 2024-02-21 DIAGNOSIS — R311 Benign essential microscopic hematuria: Secondary | ICD-10-CM | POA: Diagnosis not present

## 2024-02-21 DIAGNOSIS — R109 Unspecified abdominal pain: Secondary | ICD-10-CM | POA: Diagnosis not present

## 2024-02-21 DIAGNOSIS — N3020 Other chronic cystitis without hematuria: Secondary | ICD-10-CM | POA: Diagnosis not present

## 2024-02-21 DIAGNOSIS — R1084 Generalized abdominal pain: Secondary | ICD-10-CM | POA: Diagnosis not present

## 2024-02-23 DIAGNOSIS — F5101 Primary insomnia: Secondary | ICD-10-CM | POA: Diagnosis not present

## 2024-02-23 DIAGNOSIS — G894 Chronic pain syndrome: Secondary | ICD-10-CM | POA: Diagnosis not present

## 2024-02-29 ENCOUNTER — Inpatient Hospital Stay: Attending: Adult Health | Admitting: Adult Health

## 2024-02-29 VITALS — BP 143/66 | HR 74 | Temp 97.9°F | Resp 18 | Ht 61.5 in | Wt 147.3 lb

## 2024-02-29 DIAGNOSIS — Z170 Estrogen receptor positive status [ER+]: Secondary | ICD-10-CM | POA: Insufficient documentation

## 2024-02-29 DIAGNOSIS — C50412 Malignant neoplasm of upper-outer quadrant of left female breast: Secondary | ICD-10-CM | POA: Insufficient documentation

## 2024-02-29 DIAGNOSIS — Z79811 Long term (current) use of aromatase inhibitors: Secondary | ICD-10-CM | POA: Diagnosis not present

## 2024-02-29 NOTE — Progress Notes (Signed)
 SURVIVORSHIP VISIT:  BRIEF ONCOLOGIC HISTORY:  Oncology History  Malignant neoplasm of upper-outer quadrant of left breast in female, estrogen receptor positive (HCC)  10/22/2023 Initial Diagnosis   Malignant neoplasm of upper-outer quadrant of left breast in female, estrogen receptor positive (HCC)   10/25/2023 Cancer Staging   Staging form: Breast, AJCC 8th Edition - Clinical: Stage IA (cT1c, cN0, cM0, G2, ER+, PR+, HER2-) - Signed by Cameron Cea, MD on 10/25/2023 Stage prefix: Initial diagnosis Histologic grading system: 3 grade system   10/2023 -  Anti-estrogen oral therapy   1 mg Anastrozole  x 5 years   11/10/2023 Genetic Testing   Negative genetic testing on the CancerNext-Expanded+RNAinsight panel.  The report date is November 10, 2023.  The CancerNext-Expanded gene panel offered by [facility] and includes sequencing, rearrangement, and RNA analysis for the following 76 genes: AIP, ALK, APC, ATM, AXIN2, BAP1, BARD1, BMPR1A, BRCA1, BRCA2, BRIP1, CDC73, CDH1, CDK4, CDKN1B, CDKN2A, CEBPA, CHEK2, CTNNA1, DDX41, DICER1, ETV6, FH, FLCN, GATA2, LZTR1, MAX, MBD4, MEN1, MET, MLH1, MSH2, MSH3, MSH6, MUTYH, NF1, NF2, NTHL1, PALB2, PHOX2B, PMS2, POT1, PRKAR1A, PTCH1, PTEN, RAD51C, RAD51D, RB1, RET, RUNX1, SDHA, SDHAF2, SDHB, SDHC, SDHD, SMAD4, SMARCA4, SMARCB1, SMARCE1, STK11, SUFU, TMEM127, TP53, TSC1, TSC2, VHL, and WT1 (sequencing and deletion/duplication); EGFR, HOXB13, KIT, MITF, PDGFRA, POLD1, and POLE (sequencing only); EPCAM and GREM1 (deletion/duplication only).       INTERVAL HISTORY:  Ms. Sellers to review her survivorship care plan detailing her treatment course for breast cancer, as well as monitoring long-term side effects of that treatment, education regarding health maintenance, screening, and overall wellness and health promotion.     Overall, Ms. Misener reports feeling quite well.  She is taking anastrozole  daily and toelrates it moderately well.    REVIEW OF SYSTEMS:  Review  of Systems  Constitutional:  Negative for appetite change, chills, fatigue, fever and unexpected weight change.  HENT:   Negative for hearing loss, lump/mass and trouble swallowing.   Eyes:  Negative for eye problems and icterus.  Respiratory:  Negative for chest tightness, cough and shortness of breath.   Cardiovascular:  Negative for chest pain, leg swelling and palpitations.  Gastrointestinal:  Negative for abdominal distention, abdominal pain, constipation, diarrhea, nausea and vomiting.  Endocrine: Negative for hot flashes.  Genitourinary:  Negative for difficulty urinating.   Musculoskeletal:  Negative for arthralgias.  Skin:  Negative for itching and rash.  Neurological:  Negative for dizziness, extremity weakness, headaches and numbness.  Hematological:  Negative for adenopathy. Does not bruise/bleed easily.  Psychiatric/Behavioral:  Negative for depression. The patient is not nervous/anxious.    Breast: Denies any new nodularity, masses, tenderness, nipple changes, or nipple discharge.       PAST MEDICAL/SURGICAL HISTORY:  Past Medical History:  Diagnosis Date   Anxiety    Arthritis KNEES   OA   Calculus of left kidney    Depression    Diabetes mellitus without complication (HCC)    PRE DIABETIC ORAL MEDS    Family history of breast cancer    Frequency of urination    GERD (gastroesophageal reflux disease)    Glaucoma BILATERAL EYES   History of kidney stones    Hydronephrosis, left    DR. STEVEN DAHLSTADT   Hyperglycemia    Hypertension    Nocturia    Pneumonia    Wears glasses    Past Surgical History:  Procedure Laterality Date   BREAST BIOPSY Left 09/10/2023   US  LT BREAST BX W LOC DEV 1ST  LESION IMG BX SPEC US  GUIDE 09/10/2023 [facility]   BREAST BIOPSY  09/27/2023   MM LT RADIOACTIVE SEED LOC MAMMO GUIDE 09/27/2023 [facility]MO[facility]   BREAST LUMPECTOMY WITH RADIOACTIVE SEED LOCALIZATION Left 09/29/2023   Procedure: LEFT BREAST SEED LOCALIZED  LUMPECTOMY;  Surgeon: Lockie Rima, MD;  Location: [facility];  Service: General;  Laterality: Left;   CARPAL TUNNEL RELEASE  06-17-2005   RIGHT   CATARACT EXTRACTION W/ INTRAOCULAR LENS  IMPLANT, BILATERAL     CYSTO/ LEFT RETROGRADE PYELOGRAM/ LEFT URETERAL STENT PLACEMENT  04-15-2005   URETERAL STONE   CYSTOSCOPY/RETROGRADE/URETEROSCOPY  01/27/2012   Procedure: CYSTOSCOPY/RETROGRADE/URETEROSCOPY;  Surgeon: Trent Frizzle, MD;  Location[facility];  Service: Urology;  Laterality: Right;   EXTRACORPOREAL SHOCK WAVE LITHOTRIPSY  11-09-11   RIGHT   TONSILLECTOMY AND ADENOIDECTOMY  AGE 7   TOTAL KNEE ARTHROPLASTY  09-29-2005   RIGHT   TOTAL KNEE ARTHROPLASTY Left 01/17/2013   Procedure: TOTAL KNEE ARTHROPLASTY;  Surgeon: Jasmine Mesi, MD;  [facility]: [facility];  Service: Orthopedics;  Laterality: Left;  Left Total Knee Arthroplasty     ALLERGIES:  Allergies  Allergen Reactions   Amiloride Other (See Comments)    High blood level of potassium  Other Reaction(s): high blood level of potassium   Atorvastatin Calcium  Other (See Comments)    Muscle weakness and pain   Atorvastatin Calcium      Other Reaction(s): muscle weakness and pain   Emetrol     Other reaction(s): Unknown   Macrobid [Nitrofurantoin Monohydrate Macrocrystals] Nausea And Vomiting   Propoxyphene Other (See Comments)    MADE PT STAY AWAKE     CURRENT MEDICATIONS:  Outpatient Encounter Medications as of 02/29/2024  Medication Sig   anastrozole  (ARIMIDEX ) 1 MG tablet Take 1 tablet (1 mg total) by mouth daily.   calcium  carbonate (SUPER CALCIUM ) 1500 (600 Ca) MG TABS tablet 1 tablet with meals   cholecalciferol  (VITAMIN D3) 25 MCG (1000 UNIT) tablet 1 tablet   diclofenac Sodium (VOLTAREN) 1 % GEL Apply 1 g topically daily.   famotidine (PEPCID) 20 MG tablet Take 20 mg by mouth See admin instructions. At bedtime every other night - alternating with Prevacid takes as needed    hydrochlorothiazide (HYDRODIURIL) 25 MG tablet Take 25 mg by mouth daily.   HYDROcodone -acetaminophen  (NORCO/VICODIN) 5-325 MG tablet Take 1 tablet by mouth every 6 (six) hours as needed for moderate pain (pain score 4-6).   Lancets (ONETOUCH DELICA PLUS LANCET33G) MISC U 1 LANCET ONCE A DAY   lansoprazole (PREVACID) 15 MG capsule Take 15 mg by mouth daily before breakfast.   latanoprost  (XALATAN ) 0.005 % ophthalmic solution 1 drop into both eyes in the evening   magnesium  oxide (MAG-OX) 400 MG tablet Take 400 mg by mouth daily.   metFORMIN  (GLUCOPHAGE -XR) 500 MG 24 hr tablet Take 500 mg by mouth 2 (two) times daily with a meal. Takes with evening meals   metoprolol  tartrate (LOPRESSOR ) 25 MG tablet Take 1 tablet by mouth 2 (two) times daily with a meal.   Multiple Vitamins-Minerals (MULTIVITAMIN ADULTS) TABS Take 1 tablet by mouth daily.   ONETOUCH VERIO test strip U ONCE A DAY   oxybutynin  (DITROPAN ) 5 MG tablet Take 5 mg by mouth 3 (three) times daily. PRN   sertraline (ZOLOFT) 50 MG tablet Take 50 mg by mouth at bedtime.   simvastatin (ZOCOR) 10 MG tablet 1 tablet in the evening   traMADol  (ULTRAM ) 50 MG tablet 1 tablet as  needed   No facility-administered encounter medications on file as of 02/29/2024.     ONCOLOGIC FAMILY HISTORY:  Family History  Problem Relation Age of Onset   CVA Mother    Heart attack Father    Breast cancer Sister 69   Breast cancer Niece 6     SOCIAL HISTORY:  Social History   Socioeconomic History   Marital status: Married    Spouse name: Not on file   Number of children: Not on file   Years of education: Not on file   Highest education level: Not on file  Occupational History   Not on file  Tobacco Use   Smoking status: Never   Smokeless tobacco: Never  Vaping Use   Vaping status: Never Used  Substance and Sexual Activity   Alcohol use: No   Drug use: No   Sexual activity: Not Currently    Birth control/protection: Post-menopausal   Other Topics Concern   Not on file  Social History Narrative   Not on file   Social Drivers of Health   Financial Resource Strain: Not on file  Food Insecurity: Not on file  Transportation Needs: Not on file  Physical Activity: Not on file  Stress: Not on file  Social Connections: Not on file  Intimate Partner Violence: Not on file     OBSERVATIONS/OBJECTIVE:  BP (!) 143/66 (BP Location: Left Arm, Patient Position: Sitting)   Pulse 74   Temp 97.9 F (36.6 C) (Temporal)   Resp 18   Ht 5' 1.5" (1.562 m)   Wt 147 lb 4.8 oz (66.8 kg)   SpO2 99%   BMI 27.38 kg/m  GENERAL: Patient is a well appearing female in no acute distress HEENT:  Sclerae anicteric.  Oropharynx clear and moist. No ulcerations or evidence of oropharyngeal candidiasis. Neck is supple.  NODES:  No cervical, supraclavicular, or axillary lymphadenopathy palpated.  BREAST EXAM:  left breast s/p lumpectomy, no sign of local recurrence, right breast benign LUNGS:  Clear to auscultation bilaterally.  No wheezes or rhonchi. HEART:  Regular rate and rhythm. No murmur appreciated. ABDOMEN:  Soft, nontender.  Positive, normoactive bowel sounds. No organomegaly palpated. MSK:  No focal spinal tenderness to palpation. Full range of motion bilaterally in the upper extremities. EXTREMITIES:  No peripheral edema.   SKIN:  Clear with no obvious rashes or skin changes. No nail dyscrasia. NEURO:  Nonfocal. Well oriented.  Appropriate affect.   LABORATORY DATA:  None for this visit.  DIAGNOSTIC IMAGING:  None for this visit.      ASSESSMENT AND PLAN:  Ms.. Stellmach is a pleasant 83 y.o. female with Stage IA left breast invasive ductal carcinoma, ER+/PR+/HER2-, diagnosed in 09/2023, treated with lumpectomy,, and anti-estrogen therapy with Anastrozole  beginning in 10/2023.  She presents to the Survivorship Clinic for our initial meeting and routine follow-up post-completion of treatment for breast cancer.    1. Stage IA  left breast cancer:  Ms. Runyon is continuing to recover from definitive treatment for breast cancer. She will follow-up with her medical oncologist, Dr. Lee Public in 6 months with history and physical exam per surveillance protocol.  She will continue her anti-estrogen therapy with Anastrozole . Thus far, she is tolerating the Anastrozole  well, with minimal side effects. Her mammogram is due 08/2024; orders placed today.   Today, a comprehensive survivorship care plan and treatment summary was reviewed with the patient today detailing her breast cancer diagnosis, treatment course, potential late/long-term effects of treatment, appropriate follow-up care with  recommendations for the future, and patient education resources.  A copy of this summary, along with a letter will be sent to the patient's primary care provider via mail/fax/In Basket message after today's visit.    2. Bone health:  Given Ms. Raneri's age/history of breast cancer and her current treatment regimen including anti-estrogen therapy with Anastrozole , she is at risk for bone demineralization.  Her last DEXA scan was 10/05/2023 and showed T score of -2.0 consistent with osteopenia  Repeat is recommended in 2 years time.  She was given education on specific activities to promote bone health.  3. Cancer screening:  Due to Ms. Lenhoff's history and her age, she should receive screening for skin cancers, colon cancer, and gynecologic cancers.  The information and recommendations are listed on the patient's comprehensive care plan/treatment summary and were reviewed in detail with the patient.    4. Health maintenance and wellness promotion: Ms. Merlino was encouraged to consume 5-7 servings of fruits and vegetables per day. We reviewed the "Nutrition Rainbow" handout.  She was also encouraged to engage in moderate to vigorous exercise for 30 minutes per day most days of the week.  She was instructed to limit her alcohol consumption and continue to abstain from  tobacco use.     5. Support services/counseling: It is not uncommon for this period of the patient's cancer care trajectory to be one of many emotions and stressors.   She was given information regarding our available services and encouraged to contact me with any questions or for help enrolling in any of our support group/programs.    Follow up instructions:    -Return to cancer center in 6 months for f/u with Dr. Lee Public  -Mammogram due in 08/2024 -F/u with Dr. Cherlynn Cornfield in 05/2024 -Repeat bone density testing in 09/2025 -She is welcome to return back to the Survivorship Clinic at any time; no additional follow-up needed at this time.  -Consider referral back to survivorship as a long-term survivor for continued surveillance  The patient was provided an opportunity to ask questions and all were answered. The patient agreed with the plan and demonstrated an understanding of the instructions.   Total encounter time:45 minutes*in face-to-face visit time, chart review, lab review, care coordination, order entry, and documentation of the encounter time.    Alwin Baars, NP 02/29/24 1:41 PM Medical Oncology and Hematology [facility] [address] [place], [place] [address] Tel. [phone]    Fax. [phone]  *Total Encounter Time as defined by the Centers for Medicare and Medicaid Services includes, in addition to the face-to-face time of a patient visit (documented in the note above) non-face-to-face time: obtaining and reviewing outside history, ordering and reviewing medications, tests or procedures, care coordination (communications with other health care professionals or caregivers) and documentation in the medical record.

## 2024-03-01 ENCOUNTER — Telehealth: Payer: Self-pay | Admitting: Hematology and Oncology

## 2024-03-01 NOTE — Telephone Encounter (Signed)
 Left vm about scheduled appt dte and time.

## 2024-03-05 ENCOUNTER — Encounter: Payer: Self-pay | Admitting: Adult Health

## 2024-03-08 DIAGNOSIS — Z170 Estrogen receptor positive status [ER+]: Secondary | ICD-10-CM | POA: Diagnosis not present

## 2024-03-08 DIAGNOSIS — E1169 Type 2 diabetes mellitus with other specified complication: Secondary | ICD-10-CM | POA: Diagnosis not present

## 2024-03-08 DIAGNOSIS — I10 Essential (primary) hypertension: Secondary | ICD-10-CM | POA: Diagnosis not present

## 2024-03-08 DIAGNOSIS — F32A Depression, unspecified: Secondary | ICD-10-CM | POA: Diagnosis not present

## 2024-03-29 DIAGNOSIS — H18593 Other hereditary corneal dystrophies, bilateral: Secondary | ICD-10-CM | POA: Diagnosis not present

## 2024-03-29 DIAGNOSIS — H401134 Primary open-angle glaucoma, bilateral, indeterminate stage: Secondary | ICD-10-CM | POA: Diagnosis not present

## 2024-03-29 DIAGNOSIS — E119 Type 2 diabetes mellitus without complications: Secondary | ICD-10-CM | POA: Diagnosis not present

## 2024-03-29 DIAGNOSIS — H02882 Meibomian gland dysfunction right lower eyelid: Secondary | ICD-10-CM | POA: Diagnosis not present

## 2024-03-29 DIAGNOSIS — H26493 Other secondary cataract, bilateral: Secondary | ICD-10-CM | POA: Diagnosis not present

## 2024-03-29 DIAGNOSIS — H02885 Meibomian gland dysfunction left lower eyelid: Secondary | ICD-10-CM | POA: Diagnosis not present

## 2024-03-29 DIAGNOSIS — H3581 Retinal edema: Secondary | ICD-10-CM | POA: Diagnosis not present

## 2024-04-08 DIAGNOSIS — I10 Essential (primary) hypertension: Secondary | ICD-10-CM | POA: Diagnosis not present

## 2024-04-08 DIAGNOSIS — F32A Depression, unspecified: Secondary | ICD-10-CM | POA: Diagnosis not present

## 2024-04-08 DIAGNOSIS — E1169 Type 2 diabetes mellitus with other specified complication: Secondary | ICD-10-CM | POA: Diagnosis not present

## 2024-04-08 DIAGNOSIS — Z170 Estrogen receptor positive status [ER+]: Secondary | ICD-10-CM | POA: Diagnosis not present

## 2024-04-12 DIAGNOSIS — H401134 Primary open-angle glaucoma, bilateral, indeterminate stage: Secondary | ICD-10-CM | POA: Diagnosis not present

## 2024-04-12 DIAGNOSIS — H18593 Other hereditary corneal dystrophies, bilateral: Secondary | ICD-10-CM | POA: Diagnosis not present

## 2024-04-12 DIAGNOSIS — H04123 Dry eye syndrome of bilateral lacrimal glands: Secondary | ICD-10-CM | POA: Diagnosis not present

## 2024-04-12 DIAGNOSIS — H353121 Nonexudative age-related macular degeneration, left eye, early dry stage: Secondary | ICD-10-CM | POA: Diagnosis not present

## 2024-04-12 DIAGNOSIS — H35443 Age-related reticular degeneration of retina, bilateral: Secondary | ICD-10-CM | POA: Diagnosis not present

## 2024-04-12 DIAGNOSIS — H353211 Exudative age-related macular degeneration, right eye, with active choroidal neovascularization: Secondary | ICD-10-CM | POA: Diagnosis not present

## 2024-04-12 DIAGNOSIS — H43823 Vitreomacular adhesion, bilateral: Secondary | ICD-10-CM | POA: Diagnosis not present

## 2024-04-18 DIAGNOSIS — R8271 Bacteriuria: Secondary | ICD-10-CM | POA: Diagnosis not present

## 2024-04-18 DIAGNOSIS — N3020 Other chronic cystitis without hematuria: Secondary | ICD-10-CM | POA: Diagnosis not present

## 2024-04-20 DIAGNOSIS — H353211 Exudative age-related macular degeneration, right eye, with active choroidal neovascularization: Secondary | ICD-10-CM | POA: Diagnosis not present

## 2024-05-08 DIAGNOSIS — E1169 Type 2 diabetes mellitus with other specified complication: Secondary | ICD-10-CM | POA: Diagnosis not present

## 2024-05-08 DIAGNOSIS — F32A Depression, unspecified: Secondary | ICD-10-CM | POA: Diagnosis not present

## 2024-05-08 DIAGNOSIS — Z170 Estrogen receptor positive status [ER+]: Secondary | ICD-10-CM | POA: Diagnosis not present

## 2024-05-08 DIAGNOSIS — I10 Essential (primary) hypertension: Secondary | ICD-10-CM | POA: Diagnosis not present

## 2024-05-16 ENCOUNTER — Encounter: Payer: Self-pay | Admitting: Podiatry

## 2024-05-16 ENCOUNTER — Ambulatory Visit (INDEPENDENT_AMBULATORY_CARE_PROVIDER_SITE_OTHER): Admitting: Podiatry

## 2024-05-16 DIAGNOSIS — M79675 Pain in left toe(s): Secondary | ICD-10-CM

## 2024-05-16 DIAGNOSIS — B351 Tinea unguium: Secondary | ICD-10-CM

## 2024-05-16 DIAGNOSIS — E119 Type 2 diabetes mellitus without complications: Secondary | ICD-10-CM | POA: Diagnosis not present

## 2024-05-16 DIAGNOSIS — M79674 Pain in right toe(s): Secondary | ICD-10-CM | POA: Diagnosis not present

## 2024-05-19 ENCOUNTER — Encounter: Payer: Self-pay | Admitting: Physician Assistant

## 2024-05-19 ENCOUNTER — Other Ambulatory Visit: Payer: Self-pay

## 2024-05-19 ENCOUNTER — Ambulatory Visit (INDEPENDENT_AMBULATORY_CARE_PROVIDER_SITE_OTHER): Admitting: Physician Assistant

## 2024-05-19 DIAGNOSIS — H43823 Vitreomacular adhesion, bilateral: Secondary | ICD-10-CM | POA: Diagnosis not present

## 2024-05-19 DIAGNOSIS — H401134 Primary open-angle glaucoma, bilateral, indeterminate stage: Secondary | ICD-10-CM | POA: Diagnosis not present

## 2024-05-19 DIAGNOSIS — H35443 Age-related reticular degeneration of retina, bilateral: Secondary | ICD-10-CM | POA: Diagnosis not present

## 2024-05-19 DIAGNOSIS — M25551 Pain in right hip: Secondary | ICD-10-CM | POA: Diagnosis not present

## 2024-05-19 DIAGNOSIS — H353121 Nonexudative age-related macular degeneration, left eye, early dry stage: Secondary | ICD-10-CM | POA: Diagnosis not present

## 2024-05-19 DIAGNOSIS — H18593 Other hereditary corneal dystrophies, bilateral: Secondary | ICD-10-CM | POA: Diagnosis not present

## 2024-05-19 DIAGNOSIS — H353211 Exudative age-related macular degeneration, right eye, with active choroidal neovascularization: Secondary | ICD-10-CM | POA: Diagnosis not present

## 2024-05-19 DIAGNOSIS — H04123 Dry eye syndrome of bilateral lacrimal glands: Secondary | ICD-10-CM | POA: Diagnosis not present

## 2024-05-19 MED ORDER — METHYLPREDNISOLONE ACETATE 40 MG/ML IJ SUSP
40.0000 mg | INTRAMUSCULAR | Status: AC | PRN
  Administered 2024-05-19: 40 mg via INTRA_ARTICULAR

## 2024-05-19 MED ORDER — LIDOCAINE HCL 1 % IJ SOLN
5.0000 mL | INTRAMUSCULAR | Status: AC | PRN
  Administered 2024-05-19: 5 mL

## 2024-05-19 NOTE — Progress Notes (Signed)
 Office Visit Note   Patient: Dorothy Anderson           Date of Birth: [birth year]           MRN: [id number] Visit Date: 05/19/2024              Requested by: Elliot Charm, MD [address] [place],  [place] [address] PCP: Elliot Charm, MD   Assessment & Plan: Visit Diagnoses:  1. Pain in right hip     Plan: 83 year old woman comes in today with right lateral hip pain.  No real groin pain.  Hurts sometimes when she lays on it.  Does not have any radicular findings and does not appear to be coming from her lower back.  On x-ray has some arthritis in the hip and a small avulsion injury but this does not coordinate to where she is tender.  Will try a trochanteric bursa injection see how she does should contact us  if she is not better.  Also encouraged her to for now until she feels more steady to use a walker discussed Voltaren gel as well  Follow-Up Instructions: Return if symptoms worsen or fail to improve.   Orders:  Orders Placed This Encounter  Procedures  . XR Pelvis 1-2 Views   No orders of the defined types were placed in this encounter.     Procedures: Large Joint Inj: R greater trochanter on 05/19/2024 3:11 PM Indications: pain and diagnostic evaluation Details: 25 G 1.5 in needle, lateral approach  Arthrogram: No  Medications: 5 mL lidocaine  1 %; 40 mg methylPREDNISolone  acetate 40 MG/ML Outcome: tolerated well, no immediate complications Procedure, treatment alternatives, risks and benefits explained, specific risks discussed. Consent was given by the patient.      Clinical Data: No additional findings.   Subjective: Chief Complaint  Patient presents with  . Right Hip - Pain    HPI patient is a 83 year old woman who has had knee replacements with Dr. Addie she also has a history of breast cancer.  She comes in today complaining of right lateral hip pain.  She said because of her cancer treatment she does have a lot of falls  and usually uses a cane but she had a very low impact fall about 3 weeks ago did not see the doctor did not really have any pain but is now having pain that she can barely bear weight.  Little bit of groin pain.  No numbness or tingling she had does have a history of back issues for many years but this feels different  Review of Systems  All other systems reviewed and are negative.    Objective: Vital Signs: There were no vitals taken for this visit.  Physical Exam Constitutional:      Appearance: Normal appearance.  Pulmonary:     Effort: Pulmonary effort is normal.  Skin:    General: Skin is warm and dry.  Neurological:     General: No focal deficit present.     Mental Status: She is alert and oriented to person, place, and time.  Psychiatric:        Mood and Affect: Mood normal.        Behavior: Behavior normal.     Ortho Exam Examination of her hip she has no pain with rotation of her hip she has good extension and flexion of her leg she has no radicular findings running down her leg she goes for being tender over the trochanteric bursa  just posterior to that.  Not at the insertion of the hamstring.  Does not have significant pain with straight leg raising.  No evidence of cellulitis or infection Specialty Comments:  No specialty comments available.  Imaging: No results found.   PMFS History: Patient Active Problem List   Diagnosis Date Noted  . Nasal bones, closed fracture 12/26/2023  . Genetic testing 11/11/2023  . Malignant neoplasm of upper-outer quadrant of left breast in female, estrogen receptor positive (HCC) 10/22/2023  . Family history of breast cancer   . Chronic kidney disease, stage 2 (mild) 04/22/2021  . Degeneration of lumbar intervertebral disc 04/22/2021  . Osteopenia 04/22/2021  . Abnormal findings on diagnostic imaging of other specified body structures 10/26/2020  . Body mass index (BMI) 29.0-29.9, adult 10/26/2020  . Esophageal reflux 10/26/2020   . Overactive bladder 10/26/2020  . History of colonic polyps 10/26/2020  . Ptosis of both eyelids 01/28/2015  . Diabetes type 2, controlled (HCC) 10/25/2014  . High blood pressure 10/25/2014  . Osteoarthritis of left knee 01/17/2013    Class: Diagnosis of  . Osteoarthrosis involving lower leg 01/17/2013   Past Medical History:  Diagnosis Date  . Anxiety   . Arthritis KNEES   OA  . Calculus of left kidney   . Depression   . Diabetes mellitus without complication (HCC)    PRE DIABETIC ORAL MEDS   . Family history of breast cancer   . Frequency of urination   . GERD (gastroesophageal reflux disease)   . Glaucoma BILATERAL EYES  . History of kidney stones   . Hydronephrosis, left    DR. STEVEN DAHLSTADT  . Hyperglycemia   . Hypertension   . Nocturia   . Pneumonia   . Wears glasses     Family History  Problem Relation Age of Onset  . CVA Mother   . Heart attack Father   . Breast cancer Sister 2  . Breast cancer Niece 36    Past Surgical History:  Procedure Laterality Date  . BREAST BIOPSY Left 09/10/2023   US  LT BREAST BX W LOC DEV 1ST LESION IMG BX SPEC US  GUIDE 09/10/2023 [facility]MO[facility]  . BREAST BIOPSY  09/27/2023   MM LT RADIOACTIVE SEED LOC MAMMO GUIDE 09/27/2023 [facility]MO[facility]  . BREAST LUMPECTOMY WITH RADIOACTIVE SEED LOCALIZATION Left 09/29/2023   Procedure: LEFT BREAST SEED LOCALIZED LUMPECTOMY;  Surgeon: Aron Shoulders, MD;  Location: [facility];  Service: General;  Laterality: Left;  . CARPAL TUNNEL RELEASE  06-17-2005   RIGHT  . CATARACT EXTRACTION W/ INTRAOCULAR LENS  IMPLANT, BILATERAL    . CYSTO/ LEFT RETROGRADE PYELOGRAM/ LEFT URETERAL STENT PLACEMENT  04-15-2005   URETERAL STONE  . CYSTOSCOPY/RETROGRADE/URETEROSCOPY  01/27/2012   Procedure: CYSTOSCOPY/RETROGRADE/URETEROSCOPY;  Surgeon: Garnette Shack, MD;  Location: [facility];  Service: Urology;  Laterality: Right;  . EXTRACORPOREAL SHOCK WAVE LITHOTRIPSY   11-09-11   RIGHT  . TONSILLECTOMY AND ADENOIDECTOMY  AGE 89  . TOTAL KNEE ARTHROPLASTY  09-29-2005   RIGHT  . TOTAL KNEE ARTHROPLASTY Left 01/17/2013   Procedure: TOTAL KNEE ARTHROPLASTY;  Surgeon: Cordella Glendia Hutchinson, MD;  Location: [facility];  Service: Orthopedics;  Laterality: Left;  Left Total Knee Arthroplasty   Social History   Occupational History  . Not on file  Tobacco Use  . Smoking status: Never  . Smokeless tobacco: Never  Vaping Use  . Vaping status: Never Used  Substance and Sexual Activity  . Alcohol use: No  . Drug  use: No  . Sexual activity: Not Currently    Birth control/protection: Post-menopausal

## 2024-05-21 NOTE — Progress Notes (Signed)
  Subjective:  Patient ID: Dorothy Anderson, female    DOB: 04-Oct-1941,  MRN: [id number]  83 y.o. female presents preventative diabetic foot care and painful mycotic toenails of both feet that are difficult to trim. Pain interferes with daily activities and wearing enclosed shoe gear comfortably. Chief Complaint  Patient presents with   Diabetes    DFC NIDDM A1C 6.6. Toenail trim.     New problem(s): None   PCP is Elliot Charm, MD , and last visit was March 08, 2024.  Allergies  Allergen Reactions   Amiloride Other (See Comments)    High blood level of potassium  Other Reaction(s): high blood level of potassium   Atorvastatin Calcium  Other (See Comments)    Muscle weakness and pain   Atorvastatin Calcium      Other Reaction(s): muscle weakness and pain   Emetrol     Other reaction(s): Unknown   Macrobid [Nitrofurantoin Monohydrate Macrocrystals] Nausea And Vomiting   Propoxyphene Other (See Comments)    MADE PT STAY AWAKE    Review of Systems: Negative except as noted in the HPI.   Objective:  Dorothy Anderson is a pleasant 83 y.o. female WD, WN in NAD. AAO x 3.  Vascular Examination: CFT <3 seconds b/l LE. Palpable DP pulse(s) b/l LE. Faintly palpable PT pulse(s) b/l LE. Pedal hair absent. No pain with calf compression b/l. No edema noted b/l LE. Varicosities present b/l.  Neurological Examination: Sensation grossly intact b/l with 10 gram monofilament. Vibratory sensation intact b/l.  Dermatological Examination: Pedal skin with normal turgor, texture and tone b/l. No open wounds nor interdigital macerations noted. Toenails 1-5 b/l thick, discolored, elongated with subungual debris and pain on dorsal palpation. No hyperkeratotic lesions noted b/l.   Musculoskeletal Examination: Muscle strength 5/5 to b/l LE.  No pain, crepitus noted b/l. Muscle strength 5/5 to all lower extremity muscle groups bilaterally. No pain, crepitus or joint limitation noted with ROM bilateral  LE. Hammertoe(s) 2-5 b/l.  Radiographs: None  Assessment:   1. Pain due to onychomycosis of toenails of both feet   2. Controlled type 2 diabetes mellitus without complication, without long-term current use of insulin  (HCC)    Plan:  Consent given for treatment. Patient examined. All patient's and/or POA's questions/concerns addressed on today's visit. Mycotic toenails 1-5 debrided in length and girth without incident. Continue foot and shoe inspections daily. Monitor blood glucose per PCP/Endocrinologist's recommendations.Continue soft, supportive shoe gear daily. Report any pedal injuries to medical professional. Call office if there are any quesitons/concerns. -Patient/POA to call should there be question/concern in the interim.  Return in about 4 months (around 09/16/2024).  Dorothy Anderson, DPM      [facility] LOCATION: [address] [place] [address]                   Office [phone]   [facility] LOCATION: [address] [place], [place] [address] Office [phone]

## 2024-05-22 DIAGNOSIS — Z170 Estrogen receptor positive status [ER+]: Secondary | ICD-10-CM | POA: Diagnosis not present

## 2024-05-22 DIAGNOSIS — C50412 Malignant neoplasm of upper-outer quadrant of left female breast: Secondary | ICD-10-CM | POA: Diagnosis not present

## 2024-05-22 DIAGNOSIS — Z803 Family history of malignant neoplasm of breast: Secondary | ICD-10-CM | POA: Diagnosis not present

## 2024-05-24 ENCOUNTER — Encounter: Payer: Self-pay | Admitting: Orthopedic Surgery

## 2024-05-24 ENCOUNTER — Ambulatory Visit (INDEPENDENT_AMBULATORY_CARE_PROVIDER_SITE_OTHER): Admitting: Orthopedic Surgery

## 2024-05-24 ENCOUNTER — Other Ambulatory Visit: Payer: Self-pay

## 2024-05-24 DIAGNOSIS — M1611 Unilateral primary osteoarthritis, right hip: Secondary | ICD-10-CM

## 2024-05-24 DIAGNOSIS — M25551 Pain in right hip: Secondary | ICD-10-CM | POA: Diagnosis not present

## 2024-05-24 MED ORDER — METHYLPREDNISOLONE ACETATE 80 MG/ML IJ SUSP
80.0000 mg | INTRAMUSCULAR | Status: AC | PRN
Start: 2024-05-24 — End: 2024-05-24
  Administered 2024-05-24: 80 mg via INTRA_ARTICULAR

## 2024-05-24 MED ORDER — LIDOCAINE HCL 1 % IJ SOLN
5.0000 mL | INTRAMUSCULAR | Status: AC | PRN
  Administered 2024-05-24: 5 mL

## 2024-05-24 MED ORDER — BUPIVACAINE HCL 0.25 % IJ SOLN
4.0000 mL | INTRAMUSCULAR | Status: AC | PRN
  Administered 2024-05-24: 4 mL via INTRA_ARTICULAR

## 2024-05-24 NOTE — Progress Notes (Signed)
 Office Visit Note   Patient: Dorothy Anderson           Date of Birth: 22-Aug-1941           MRN: [id number] Visit Date: 05/24/2024 Requested by: Elliot Charm, MD [address] [place],  [place] [address] PCP: Elliot Charm, MD  Subjective: Chief Complaint  Patient presents with   Right Hip - Pain    HPI: Dorothy Anderson is a 83 y.o. female who presents to the office reporting right hip pain.  She had trochanteric injections 08/19/2024 which did not help.  Hurts her in the right hip region to stand and walk.  Pain is lateral but also occurs in the groin and around the sciatic notch region.  She had breast cancer surgery in November of last year.  Has had some falls since then.  Is using a cane.  States that her back really always hurts.  Deep pain in the leg which radiates down to but not below the knee.  No numbness or tingling..                ROS: All systems reviewed are negative as they relate to the chief complaint within the history of present illness.  Patient denies fevers or chills.  Assessment & Plan: Visit Diagnoses:  1. Pain in right hip     Plan: Impression is right hip pain.  Radiographs do show some arthritis in both hips.  No nerve root tension signs today and no weakness.  Diagnostic and therapeutic right hip injection performed today.  She did get a fair amount of relief during the anesthetic phase of the injection.  MRI pelvis indicated to evaluate right hip pain in the face of diagnosis of breast cancer.  Essentially the differential diagnosis in this is symptomatic right hip arthritis versus referred pain from the back.  Depending on her response to this injection that we will guide us  as far as further treatment and diagnostic workup.  Follow-Up Instructions: No follow-ups on file.   Orders:  Orders Placed This Encounter  Procedures   US  Guided Needle Placement - No Linked Charges   MR Pelvis w/o contrast   No orders of the defined  types were placed in this encounter.     Procedures: Large Joint Inj: R hip joint on 05/24/2024 10:35 PM Indications: pain and diagnostic evaluation Details: 22 G 3.5 in needle, ultrasound-guided anterior approach  Arthrogram: No  Medications: 5 mL lidocaine  1 %; 80 mg methylPREDNISolone  acetate 80 MG/ML; 4 mL bupivacaine  0.25 % Outcome: tolerated well, no immediate complications Procedure, treatment alternatives, risks and benefits explained, specific risks discussed. Consent was given by the patient. Immediately prior to procedure a time out was called to verify the correct patient, procedure, equipment, support staff and site/side marked as required. Patient was prepped and draped in the usual sterile fashion.       Clinical Data: No additional findings.  Objective: Vital Signs: There were no vitals taken for this visit.  Physical Exam:  Constitutional: Patient appears well-developed HEENT:  Head: Normocephalic Eyes:EOM are normal Neck: Normal range of motion Cardiovascular: Normal rate Pulmonary/chest: Effort normal Neurologic: Patient is alert Skin: Skin is warm Psychiatric: Patient has normal mood and affect  Ortho Exam: Ortho exam demonstrates mild groin pain on the right with internal/external rotation of the right.  No groin pain on the left.  5 out of 5 ankle dorsiflexion plantarflexion knee extension knee flexion as well as  hip flexion strength.  No discrete trochanteric tenderness right versus left.  No definite paresthesias L1 S1 bilaterally.  Patient is able to ambulate.  Specialty Comments:  No specialty comments available.  Imaging: No results found.   PMFS History: Patient Active Problem List   Diagnosis Date Noted   Nasal bones, closed fracture 12/26/2023   Genetic testing 11/11/2023   Malignant neoplasm of upper-outer quadrant of left breast in female, estrogen receptor positive (HCC) 10/22/2023   Family history of breast cancer    Chronic kidney  disease, stage 2 (mild) 04/22/2021   Degeneration of lumbar intervertebral disc 04/22/2021   Osteopenia 04/22/2021   Abnormal findings on diagnostic imaging of other specified body structures 10/26/2020   Body mass index (BMI) 29.0-29.9, adult 10/26/2020   Esophageal reflux 10/26/2020   Overactive bladder 10/26/2020   History of colonic polyps 10/26/2020   Ptosis of both eyelids 01/28/2015   Diabetes type 2, controlled (HCC) 10/25/2014   High blood pressure 10/25/2014   Osteoarthritis of left knee 01/17/2013    Class: Diagnosis of   Osteoarthrosis involving lower leg 01/17/2013   Past Medical History:  Diagnosis Date   Anxiety    Arthritis KNEES   OA   Calculus of left kidney    Depression    Diabetes mellitus without complication (HCC)    PRE DIABETIC ORAL MEDS    Family history of breast cancer    Frequency of urination    GERD (gastroesophageal reflux disease)    Glaucoma BILATERAL EYES   History of kidney stones    Hydronephrosis, left    DR. STEVEN DAHLSTADT   Hyperglycemia    Hypertension    Nocturia    Pneumonia    Wears glasses     Family History  Problem Relation Age of Onset   CVA Mother    Heart attack Father    Breast cancer Sister 54   Breast cancer Niece 32    Past Surgical History:  Procedure Laterality Date   BREAST BIOPSY Left 09/10/2023   US  LT BREAST BX W LOC DEV 1ST LESION IMG BX SPEC US  GUIDE 09/10/2023 [facility]   BREAST BIOPSY  09/27/2023   MM LT RADIOACTIVE SEED LOC MAMMO GUIDE 09/27/2023 [facility]   BREAST LUMPECTOMY WITH RADIOACTIVE SEED LOCALIZATION Left 09/29/2023   Procedure: LEFT BREAST SEED LOCALIZED LUMPECTOMY;  Surgeon: Aron Shoulders, MD;  Location: [facility];  Service: General;  Laterality: Left;   CARPAL TUNNEL RELEASE  06-17-2005   RIGHT   CATARACT EXTRACTION W/ INTRAOCULAR LENS  IMPLANT, BILATERAL     CYSTO/ LEFT RETROGRADE PYELOGRAM/ LEFT URETERAL STENT PLACEMENT  04-15-2005   URETERAL STONE    CYSTOSCOPY/RETROGRADE/URETEROSCOPY  01/27/2012   Procedure: CYSTOSCOPY/RETROGRADE/URETEROSCOPY;  Surgeon: Garnette Shack, MD;  Location: [facility];  Service: Urology;  Laterality: Right;   EXTRACORPOREAL SHOCK WAVE LITHOTRIPSY  11-09-11   RIGHT   TONSILLECTOMY AND ADENOIDECTOMY  AGE 74   TOTAL KNEE ARTHROPLASTY  09-29-2005   RIGHT   TOTAL KNEE ARTHROPLASTY Left 01/17/2013   Procedure: TOTAL KNEE ARTHROPLASTY;  Surgeon: Cordella Glendia Hutchinson, MD;  Location: [facility];  Service: Orthopedics;  Laterality: Left;  Left Total Knee Arthroplasty   Social History   Occupational History   Not on file  Tobacco Use   Smoking status: Never   Smokeless tobacco: Never  Vaping Use   Vaping status: Never Used  Substance and Sexual Activity   Alcohol use: No   Drug use: No  Sexual activity: Not Currently    Birth control/protection: Post-menopausal

## 2024-05-25 ENCOUNTER — Telehealth: Payer: Self-pay

## 2024-05-25 NOTE — Telephone Encounter (Signed)
 FYI  Patient called. Would like for me to let Dr. Addie & Deane know she is doing better after getting right hip inj yesterday. States she is now able to walk with cane. She also has MRI scheduled for 06/08/2024.   [phone]

## 2024-05-25 NOTE — Telephone Encounter (Signed)
 noted

## 2024-06-05 DIAGNOSIS — I10 Essential (primary) hypertension: Secondary | ICD-10-CM | POA: Diagnosis not present

## 2024-06-05 DIAGNOSIS — R400 Somnolence: Secondary | ICD-10-CM | POA: Diagnosis not present

## 2024-06-05 DIAGNOSIS — E1169 Type 2 diabetes mellitus with other specified complication: Secondary | ICD-10-CM | POA: Diagnosis not present

## 2024-06-05 DIAGNOSIS — G894 Chronic pain syndrome: Secondary | ICD-10-CM | POA: Diagnosis not present

## 2024-06-05 DIAGNOSIS — M1990 Unspecified osteoarthritis, unspecified site: Secondary | ICD-10-CM | POA: Diagnosis not present

## 2024-06-08 ENCOUNTER — Ambulatory Visit
Admission: RE | Admit: 2024-06-08 | Discharge: 2024-06-08 | Disposition: A | Discharge status: Home / Self Care | Source: Ambulatory Visit | Attending: Orthopedic Surgery | Admitting: Orthopedic Surgery

## 2024-06-08 DIAGNOSIS — E1169 Type 2 diabetes mellitus with other specified complication: Secondary | ICD-10-CM | POA: Diagnosis not present

## 2024-06-08 DIAGNOSIS — F32A Depression, unspecified: Secondary | ICD-10-CM | POA: Diagnosis not present

## 2024-06-08 DIAGNOSIS — M160 Bilateral primary osteoarthritis of hip: Secondary | ICD-10-CM | POA: Diagnosis not present

## 2024-06-08 DIAGNOSIS — Z170 Estrogen receptor positive status [ER+]: Secondary | ICD-10-CM | POA: Diagnosis not present

## 2024-06-08 DIAGNOSIS — I10 Essential (primary) hypertension: Secondary | ICD-10-CM | POA: Diagnosis not present

## 2024-06-08 DIAGNOSIS — M25551 Pain in right hip: Secondary | ICD-10-CM

## 2024-06-12 ENCOUNTER — Ambulatory Visit: Payer: Self-pay | Admitting: Orthopedic Surgery

## 2024-06-12 NOTE — Progress Notes (Signed)
 I called rec nwb for 4 w with 4wr pls arrange may need tha

## 2024-06-23 DIAGNOSIS — H353211 Exudative age-related macular degeneration, right eye, with active choroidal neovascularization: Secondary | ICD-10-CM | POA: Diagnosis not present

## 2024-07-05 ENCOUNTER — Ambulatory Visit (INDEPENDENT_AMBULATORY_CARE_PROVIDER_SITE_OTHER): Admitting: Orthopedic Surgery

## 2024-07-05 DIAGNOSIS — M1611 Unilateral primary osteoarthritis, right hip: Secondary | ICD-10-CM

## 2024-07-06 ENCOUNTER — Encounter: Payer: Self-pay | Admitting: Orthopedic Surgery

## 2024-07-06 NOTE — Progress Notes (Signed)
 Office Visit Note   Patient: Dorothy Anderson           Date of Birth: 04-18-1941           MRN: [id number] Visit Date: 07/05/2024 Requested by: Elliot Charm, MD [address] [place],  [place] [address] PCP: Elliot Charm, MD  Subjective: Chief Complaint  Patient presents with   Follow-up    Review mri    HPI: Dorothy Anderson is a 83 y.o. female who presents to the office reporting continued right hip pain.  She had an injection done 05/24/2024.  Was having symptoms several months before that.  MRI scan of the pelvis 2 weeks later demonstrated either acute AVN or stress fracture involving the entire femoral head..  Currently patient states she is not doing well.  Has good and bad days.  He is using a rolling walker.  Breast cancer treatment is doing well.  No cardiac history..                ROS: All systems reviewed are negative as they relate to the chief complaint within the history of present illness.  Patient denies fevers or chills.  Assessment & Plan: Visit Diagnoses:  1. Arthritis of right hip     Plan: Impression is right hip pain with diminished ambulatory ability.  Patient really does not want to live at this rest of her life.  Atypical but significant findings on MRI scan.  Would like her to see one of my partners about hip replacement.  She likely could not have hip replacement at the earliest until mid September which would be 2 months after her hip injection.  Continue with ambulation with a rolling walker as tolerated.  Of note Gaither her husband asked me to look at his right third finger which is having some pain.  He does have a cyst right below the neurovascular bundle at the level of the A1 pulley.  This likely causing his symptoms.  He will get that addressed once his wife's hip situation is resolved.  Follow-Up Instructions: No follow-ups on file.   Orders:  No orders of the defined types were placed in this encounter.  No orders of  the defined types were placed in this encounter.     Procedures: No procedures performed   Clinical Data: No additional findings.  Objective: Vital Signs: There were no vitals taken for this visit.  Physical Exam:  Constitutional: Patient appears well-developed HEENT:  Head: Normocephalic Eyes:EOM are normal Neck: Normal range of motion Cardiovascular: Normal rate Pulmonary/chest: Effort normal Neurologic: Patient is alert Skin: Skin is warm Psychiatric: Patient has normal mood and affect  Ortho Exam: Ortho exam demonstrates some pain with right hip internal and external rotation but no distinct loss of motion.  Hip flexion abduction adduction strength is intact.  Left hip has no pain with range of motion.  No nerve root tension signs.  Pedal pulses palpable.  Specialty Comments:  No specialty comments available.  Imaging: No results found.   PMFS History: Patient Active Problem List   Diagnosis Date Noted   Nasal bones, closed fracture 12/26/2023   Genetic testing 11/11/2023   Malignant neoplasm of upper-outer quadrant of left breast in female, estrogen receptor positive (HCC) 10/22/2023   Family history of breast cancer    Chronic kidney disease, stage 2 (mild) 04/22/2021   Degeneration of lumbar intervertebral disc 04/22/2021   Osteopenia 04/22/2021   Abnormal findings on diagnostic imaging of  other specified body structures 10/26/2020   Body mass index (BMI) 29.0-29.9, adult 10/26/2020   Esophageal reflux 10/26/2020   Overactive bladder 10/26/2020   History of colonic polyps 10/26/2020   Ptosis of both eyelids 01/28/2015   Diabetes type 2, controlled (HCC) 10/25/2014   High blood pressure 10/25/2014   Osteoarthritis of left knee 01/17/2013    Class: Diagnosis of   Osteoarthrosis involving lower leg 01/17/2013   Past Medical History:  Diagnosis Date   Anxiety    Arthritis KNEES   OA   Calculus of left kidney    Depression    Diabetes mellitus without  complication (HCC)    PRE DIABETIC ORAL MEDS    Family history of breast cancer    Frequency of urination    GERD (gastroesophageal reflux disease)    Glaucoma BILATERAL EYES   History of kidney stones    Hydronephrosis, left    DR. STEVEN DAHLSTADT   Hyperglycemia    Hypertension    Nocturia    Pneumonia    Wears glasses     Family History  Problem Relation Age of Onset   CVA Mother    Heart attack Father    Breast cancer Sister 61   Breast cancer Niece 42    Past Surgical History:  Procedure Laterality Date   BREAST BIOPSY Left 09/10/2023   US  LT BREAST BX W LOC DEV 1ST LESION IMG BX SPEC US  GUIDE 09/10/2023 [facility]   BREAST BIOPSY  09/27/2023   MM LT RADIOACTIVE SEED LOC MAMMO GUIDE 09/27/2023 [facility]   BREAST LUMPECTOMY WITH RADIOACTIVE SEED LOCALIZATION Left 09/29/2023   Procedure: LEFT BREAST SEED LOCALIZED LUMPECTOMY;  Surgeon: Aron Shoulders, MD;  Location: [facility];  Service: General;  Laterality: Left;   CARPAL TUNNEL RELEASE  06-17-2005   RIGHT   CATARACT EXTRACTION W/ INTRAOCULAR LENS  IMPLANT, BILATERAL     CYSTO/ LEFT RETROGRADE PYELOGRAM/ LEFT URETERAL STENT PLACEMENT  04-15-2005   URETERAL STONE   CYSTOSCOPY/RETROGRADE/URETEROSCOPY  01/27/2012   Procedure: CYSTOSCOPY/RETROGRADE/URETEROSCOPY;  Surgeon: Garnette Shack, MD;  Location: [facility];  Service: Urology;  Laterality: Right;   EXTRACORPOREAL SHOCK WAVE LITHOTRIPSY  11-09-11   RIGHT   TONSILLECTOMY AND ADENOIDECTOMY  AGE 59   TOTAL KNEE ARTHROPLASTY  09-29-2005   RIGHT   TOTAL KNEE ARTHROPLASTY Left 01/17/2013   Procedure: TOTAL KNEE ARTHROPLASTY;  Surgeon: Cordella Glendia Hutchinson, MD;  Location: [facility];  Service: Orthopedics;  Laterality: Left;  Left Total Knee Arthroplasty   Social History   Occupational History   Not on file  Tobacco Use   Smoking status: Never   Smokeless tobacco: Never  Vaping Use   Vaping status: Never Used  Substance and  Sexual Activity   Alcohol use: No   Drug use: No   Sexual activity: Not Currently    Birth control/protection: Post-menopausal

## 2024-07-09 DIAGNOSIS — Z170 Estrogen receptor positive status [ER+]: Secondary | ICD-10-CM | POA: Diagnosis not present

## 2024-07-09 DIAGNOSIS — I10 Essential (primary) hypertension: Secondary | ICD-10-CM | POA: Diagnosis not present

## 2024-07-09 DIAGNOSIS — E1169 Type 2 diabetes mellitus with other specified complication: Secondary | ICD-10-CM | POA: Diagnosis not present

## 2024-07-09 DIAGNOSIS — F32A Depression, unspecified: Secondary | ICD-10-CM | POA: Diagnosis not present

## 2024-07-11 ENCOUNTER — Telehealth: Payer: Self-pay | Admitting: Orthopedic Surgery

## 2024-07-11 NOTE — Telephone Encounter (Signed)
 Please send to [facility] for evaluation.  She has unusual pelvic MRI.  I think she needs it though.  Thanks

## 2024-07-11 NOTE — Telephone Encounter (Signed)
 Patient called and said that she reschedule all her appointments just to be open to schedule for surgery. [phone]

## 2024-07-11 NOTE — Telephone Encounter (Signed)
 Appt made for Friday. Patient aware.

## 2024-07-14 ENCOUNTER — Encounter: Payer: Self-pay | Admitting: Orthopaedic Surgery

## 2024-07-14 ENCOUNTER — Ambulatory Visit: Admitting: Orthopaedic Surgery

## 2024-07-14 ENCOUNTER — Telehealth: Payer: Self-pay

## 2024-07-14 DIAGNOSIS — M1611 Unilateral primary osteoarthritis, right hip: Secondary | ICD-10-CM

## 2024-07-14 DIAGNOSIS — M84351A Stress fracture, right femur, initial encounter for fracture: Secondary | ICD-10-CM

## 2024-07-14 DIAGNOSIS — E119 Type 2 diabetes mellitus without complications: Secondary | ICD-10-CM

## 2024-07-14 LAB — POCT GLYCOSYLATED HEMOGLOBIN (HGB A1C): Hemoglobin A1C: 7.7 % — AB (ref 4.0–5.6)

## 2024-07-14 NOTE — Progress Notes (Signed)
 Office Visit Note   Patient: Dorothy Anderson           Date of Birth: 01/01/1941           MRN: [id number] Visit Date: 07/14/2024              Requested by: Elliot Charm, MD [address] [place],  [place] [address] PCP: Elliot Charm, MD   Assessment & Plan: Visit Diagnoses:  1. Stress fracture of neck of right femur   2. Primary osteoarthritis of right hip   3. Controlled type 2 diabetes mellitus without complication, without long-term current use of insulin  (HCC)     Plan: History of Present Illness Dorothy Anderson is an 83 year old female who presents with persistent hip pain for evaluation of hip replacement surgery. She was referred by Dr. Addie for evaluation of persistent hip pain and consideration of hip replacement surgery.  She experiences persistent hip pain that has not improved despite a cortisone injection administered on May 24, 2024. An MRI was performed after the pain did not improve with a cortisone injection. The pain significantly impacts her daily activities, and she uses a rolling walker to assist with mobility.  MRI found stress fracture of femoral head and neck and DJD.  Right hip shows significant pain with weight bearing and manipulation.  Trendelenburg gait.    Results RADIOLOGY Hip MRI: Edema, avascular necrosis, stress fracture (05/24/2024)  Assessment and Plan Right hip OA and stress fracture Stress fracture confirmed by MRI. Persistent pain post-cortisone injection.  - Schedule hip replacement for mid-October, three months post-injection. - Use cane or walker to prevent falls. - Limit weight-bearing activities. - Obtain surgical clearance from Dr. Dwight. - Check hemoglobin A1c today. - Provided hip replacement surgery handout. - Arrange post-operative home physical therapy.  Total face to face encounter time was greater than 25 minutes and over half of this time was spent in counseling and/or coordination of  care.  Follow-Up Instructions: No follow-ups on file.   Orders:  Orders Placed This Encounter  Procedures   POCT HgB A1C   No orders of the defined types were placed in this encounter.     Procedures: No procedures performed   Clinical Data: No additional findings.   Subjective: Chief Complaint  Patient presents with   Right Hip - Pain    HPI  Review of Systems  Constitutional: Negative.   HENT: Negative.    Eyes: Negative.   Respiratory: Negative.    Cardiovascular: Negative.   Endocrine: Negative.   Musculoskeletal: Negative.   Neurological: Negative.   Hematological: Negative.   Psychiatric/Behavioral: Negative.    All other systems reviewed and are negative.    Objective: Vital Signs: There were no vitals taken for this visit.  Physical Exam Vitals and nursing note reviewed.  Constitutional:      Appearance: She is well-developed.  HENT:     Head: Atraumatic.     Nose: Nose normal.  Eyes:     Extraocular Movements: Extraocular movements intact.  Cardiovascular:     Pulses: Normal pulses.  Pulmonary:     Effort: Pulmonary effort is normal.  Abdominal:     Palpations: Abdomen is soft.  Musculoskeletal:     Cervical back: Neck supple.  Skin:    General: Skin is warm.     Capillary Refill: Capillary refill takes less than 2 seconds.  Neurological:     Mental Status: She is alert. Mental status is  at baseline.  Psychiatric:        Behavior: Behavior normal.        Thought Content: Thought content normal.        Judgment: Judgment normal.     Ortho Exam  Specialty Comments:  No specialty comments available.  Imaging: No results found.   PMFS History: Patient Active Problem List   Diagnosis Date Noted   Stress fracture of neck of right femur 07/14/2024   Primary osteoarthritis of right hip 07/14/2024   Nasal bones, closed fracture 12/26/2023   Genetic testing 11/11/2023   Malignant neoplasm of upper-outer quadrant of left breast  in female, estrogen receptor positive (HCC) 10/22/2023   Family history of breast cancer    Chronic kidney disease, stage 2 (mild) 04/22/2021   Degeneration of lumbar intervertebral disc 04/22/2021   Osteopenia 04/22/2021   Abnormal findings on diagnostic imaging of other specified body structures 10/26/2020   Body mass index (BMI) 29.0-29.9, adult 10/26/2020   Esophageal reflux 10/26/2020   Overactive bladder 10/26/2020   History of colonic polyps 10/26/2020   Ptosis of both eyelids 01/28/2015   Diabetes type 2, controlled (HCC) 10/25/2014   High blood pressure 10/25/2014   Osteoarthritis of left knee 01/17/2013    Class: Diagnosis of   Osteoarthrosis involving lower leg 01/17/2013   Past Medical History:  Diagnosis Date   Anxiety    Arthritis KNEES   OA   Calculus of left kidney    Depression    Diabetes mellitus without complication (HCC)    PRE DIABETIC ORAL MEDS    Family history of breast cancer    Frequency of urination    GERD (gastroesophageal reflux disease)    Glaucoma BILATERAL EYES   History of kidney stones    Hydronephrosis, left    DR. STEVEN DAHLSTADT   Hyperglycemia    Hypertension    Nocturia    Pneumonia    Wears glasses     Family History  Problem Relation Age of Onset   CVA Mother    Heart attack Father    Breast cancer Sister 67   Breast cancer Niece 45    Past Surgical History:  Procedure Laterality Date   BREAST BIOPSY Left 09/10/2023   US  LT BREAST BX W LOC DEV 1ST LESION IMG BX SPEC US  GUIDE 09/10/2023 [facility]   BREAST BIOPSY  09/27/2023   MM LT RADIOACTIVE SEED LOC MAMMO GUIDE 09/27/2023 [facility]   BREAST LUMPECTOMY WITH RADIOACTIVE SEED LOCALIZATION Left 09/29/2023   Procedure: LEFT BREAST SEED LOCALIZED LUMPECTOMY;  Surgeon: Aron Shoulders, MD;  Location: [facility];  Service: General;  Laterality: Left;   CARPAL TUNNEL RELEASE  06-17-2005   RIGHT   CATARACT EXTRACTION W/ INTRAOCULAR LENS  IMPLANT,  BILATERAL     CYSTO/ LEFT RETROGRADE PYELOGRAM/ LEFT URETERAL STENT PLACEMENT  04-15-2005   URETERAL STONE   CYSTOSCOPY/RETROGRADE/URETEROSCOPY  01/27/2012   Procedure: CYSTOSCOPY/RETROGRADE/URETEROSCOPY;  Surgeon: Garnette Shack, MD;  Location: [facility];  Service: Urology;  Laterality: Right;   EXTRACORPOREAL SHOCK WAVE LITHOTRIPSY  11-09-11   RIGHT   TONSILLECTOMY AND ADENOIDECTOMY  AGE 30   TOTAL KNEE ARTHROPLASTY  09-29-2005   RIGHT   TOTAL KNEE ARTHROPLASTY Left 01/17/2013   Procedure: TOTAL KNEE ARTHROPLASTY;  Surgeon: Cordella Glendia Hutchinson, MD;  Location: [facility];  Service: Orthopedics;  Laterality: Left;  Left Total Knee Arthroplasty   Social History   Occupational History   Not on file  Tobacco Use  Smoking status: Never   Smokeless tobacco: Never  Vaping Use   Vaping status: Never Used  Substance and Sexual Activity   Alcohol use: No   Drug use: No   Sexual activity: Not Currently    Birth control/protection: Post-menopausal

## 2024-07-14 NOTE — Telephone Encounter (Signed)
 Gave patient PCP Clearance form. She will take to PCP. They are aware we will need it back in order to schedule surgery (R THA).

## 2024-07-31 ENCOUNTER — Telehealth: Payer: Self-pay | Admitting: Orthopedic Surgery

## 2024-07-31 NOTE — Telephone Encounter (Signed)
 Dorothy Anderson called in regarding her her hip and leg pain. She states that she has a total hip scheduled for 10/02/24, but she does not think she can make it that long without taking something for pain. Her leg pain is much worse than it was at her last appointment.  She uses the [facility] in [place].

## 2024-08-01 ENCOUNTER — Other Ambulatory Visit: Payer: Self-pay | Admitting: Physician Assistant

## 2024-08-01 MED ORDER — TRAMADOL HCL 50 MG PO TABS: 50.0000 mg | ORAL_TABLET | Freq: Two times a day (BID) | ORAL | 1 refills | Status: DC | PRN

## 2024-08-01 NOTE — Telephone Encounter (Signed)
 Spoke with patient. She also mentioned that her sugars have been running high and wanted to know how to lower them. Morna recommended that she contact her PCP for assistance with that. She states understanding and will call her PCP.

## 2024-08-01 NOTE — Telephone Encounter (Signed)
Sent tramadol

## 2024-08-08 DIAGNOSIS — F32A Depression, unspecified: Secondary | ICD-10-CM | POA: Diagnosis not present

## 2024-08-08 DIAGNOSIS — Z170 Estrogen receptor positive status [ER+]: Secondary | ICD-10-CM | POA: Diagnosis not present

## 2024-08-08 DIAGNOSIS — E1169 Type 2 diabetes mellitus with other specified complication: Secondary | ICD-10-CM | POA: Diagnosis not present

## 2024-08-08 DIAGNOSIS — I10 Essential (primary) hypertension: Secondary | ICD-10-CM | POA: Diagnosis not present

## 2024-08-10 DIAGNOSIS — N3000 Acute cystitis without hematuria: Secondary | ICD-10-CM | POA: Diagnosis not present

## 2024-08-10 DIAGNOSIS — N3020 Other chronic cystitis without hematuria: Secondary | ICD-10-CM | POA: Diagnosis not present

## 2024-08-11 DIAGNOSIS — N3020 Other chronic cystitis without hematuria: Secondary | ICD-10-CM | POA: Diagnosis not present

## 2024-08-11 DIAGNOSIS — H18593 Other hereditary corneal dystrophies, bilateral: Secondary | ICD-10-CM | POA: Diagnosis not present

## 2024-08-11 DIAGNOSIS — H35443 Age-related reticular degeneration of retina, bilateral: Secondary | ICD-10-CM | POA: Diagnosis not present

## 2024-08-11 DIAGNOSIS — H401134 Primary open-angle glaucoma, bilateral, indeterminate stage: Secondary | ICD-10-CM | POA: Diagnosis not present

## 2024-08-11 DIAGNOSIS — H43823 Vitreomacular adhesion, bilateral: Secondary | ICD-10-CM | POA: Diagnosis not present

## 2024-08-11 DIAGNOSIS — H353121 Nonexudative age-related macular degeneration, left eye, early dry stage: Secondary | ICD-10-CM | POA: Diagnosis not present

## 2024-08-11 DIAGNOSIS — E1169 Type 2 diabetes mellitus with other specified complication: Secondary | ICD-10-CM | POA: Diagnosis not present

## 2024-08-11 DIAGNOSIS — Z01818 Encounter for other preprocedural examination: Secondary | ICD-10-CM | POA: Diagnosis not present

## 2024-08-11 DIAGNOSIS — M25859 Other specified joint disorders, unspecified hip: Secondary | ICD-10-CM | POA: Diagnosis not present

## 2024-08-11 DIAGNOSIS — H04123 Dry eye syndrome of bilateral lacrimal glands: Secondary | ICD-10-CM | POA: Diagnosis not present

## 2024-08-11 DIAGNOSIS — H353211 Exudative age-related macular degeneration, right eye, with active choroidal neovascularization: Secondary | ICD-10-CM | POA: Diagnosis not present

## 2024-08-11 DIAGNOSIS — I10 Essential (primary) hypertension: Secondary | ICD-10-CM | POA: Diagnosis not present

## 2024-08-14 ENCOUNTER — Telehealth: Payer: Self-pay | Admitting: Orthopedic Surgery

## 2024-08-14 NOTE — Telephone Encounter (Signed)
 Dorothy Anderson called and wants to let you know that the patient A1C is good for surgery. No call back she said.

## 2024-08-15 NOTE — Telephone Encounter (Signed)
Called and advised pt and she stated understanding

## 2024-08-15 NOTE — Telephone Encounter (Signed)
 Please let her know that it is 7.7 which is barely ok so she has to make sure to continue to keep her diabetes under control.  Thank you.

## 2024-08-21 NOTE — Progress Notes (Addendum)
 Anesthesia Review:  PCP: Dwight  Cardiologist : none   PPM/ ICD: Device Orders: Rep Notified:  Chest x-ray : EKG : 08/25/2024  Echo : Stress test: 2022  Cardiac Cath :   Activity level: can do a flight of steps without difficulty  Sleep Study/ CPAP : none  Fasting Blood Sugar :      / Checks Blood Sugar -- times a day:     DM- type 2- checks glucose 3x daily  Hgba1c- 07/14/24-7.7  Hgba1c- 08/25/24- 6.7  Metformin - none am of surgery   Blood Thinner/ Instructions /Last Dose: ASA / Instructions/ Last Dose :   PT in wheelchair and husband with a cane. Husband present at preop appt.    HOH    PT and husband state at preop that surgery might be postponed due to hgba1c results. ?    Pt was 23 minutes late for preop appt.   ? Allergy to hibiclens  .  PT instructed to wash with Dial antibacterial soap wash .

## 2024-08-23 ENCOUNTER — Encounter: Payer: Self-pay | Admitting: Orthopaedic Surgery

## 2024-08-23 NOTE — Patient Instructions (Addendum)
 SURGICAL WAITING ROOM VISITATION  Patients having surgery or a procedure may have no more than 2 support people in the waiting area - these visitors may rotate.    Children under the age of 30 must have an adult with them who is not the patient.  Visitors with respiratory illnesses are discouraged from visiting and should remain at home.  If the patient needs to stay at the hospital during part of their recovery, the visitor guidelines for inpatient rooms apply. Pre-op nurse will coordinate an appropriate time for 1 support person to accompany patient in pre-op.  This support person may not rotate.    Please refer to the [facility] website for the visitor guidelines for Inpatients (after your surgery is over and you are in a regular room).       Your procedure is scheduled on:  09/01/24    Report to [facility] Main Entrance    Report to admitting at  0715 AM   Call this number if you have problems the morning of surgery [phone]   Do not eat food :After Midnight.   After Midnight you may have the following liquids until __ 0645____ AM  DAY OF SURGERY  Water Non-Citrus Juices (without pulp, NO RED-Apple, White grape, White cranberry) Black Coffee (NO MILK/CREAM OR CREAMERS, sugar ok)  Clear Tea (NO MILK/CREAM OR CREAMERS, sugar ok) regular and decaf                             Plain Jell-O (NO RED)                                           Fruit ices (not with fruit pulp, NO RED)                                     Popsicles (NO RED)                                                               Sports drinks like Gatorade (NO RED)                   The day of surgery:  Drink ONE (1) Pre-Surgery Clear Ensure or G2 at  0645AM the morning of surgery. Drink in one sitting. Do not sip.  This drink was given to you during your hospital  pre-op appointment visit. Nothing else to drink after completing the  Pre-Surgery Clear Ensure or G2.          If you have  questions, please contact your surgeon's office.       Oral Hygiene is also important to reduce your risk of infection.                                    Remember - BRUSH YOUR TEETH THE MORNING OF SURGERY WITH YOUR REGULAR TOOTHPASTE  DENTURES WILL BE REMOVED PRIOR TO SURGERY PLEASE DO NOT APPLY Poly grip OR ADHESIVES!!!  Do NOT smoke after Midnight   Stop all vitamins and herbal supplements 7 days before surgery.   Take these medicines the morning of surgery with A SIP OF WATER:  metoprolol , ditropan  , prevacid, zoloft, eye drops as usual                 Metformin - none am of surgery   DO NOT TAKE ANY ORAL DIABETIC MEDICATIONS DAY OF YOUR SURGERY  Bring CPAP mask and tubing day of surgery.                              You may not have any metal on your body including hair pins, jewelry, and body piercing             Do not wear make-up, lotions, powders, perfumes/cologne, or deodorant  Do not wear nail polish including gel and S&S, artificial/acrylic nails, or any other type of covering on natural nails including finger and toenails. If you have artificial nails, gel coating, etc. that needs to be removed by a nail salon please have this removed prior to surgery or surgery may need to be canceled/ delayed if the surgeon/ anesthesia feels like they are unable to be safely monitored.   Do not shave  48 hours prior to surgery.               Men may shave face and neck.   Do not bring valuables to the hospital. [facility] IS NOT             RESPONSIBLE   FOR VALUABLES.   Contacts, glasses, dentures or bridgework may not be worn into surgery.   Bring small overnight bag day of surgery.   DO NOT BRING YOUR HOME MEDICATIONS TO THE HOSPITAL. PHARMACY WILL DISPENSE MEDICATIONS LISTED ON YOUR MEDICATION LIST TO YOU DURING YOUR ADMISSION IN THE HOSPITAL!    Patients discharged on the day of surgery will not be allowed to drive home.  Someone NEEDS to stay with you for the first  24 hours after anesthesia.   Special Instructions: Bring a copy of your healthcare power of attorney and living will documents the day of surgery if you haven't scanned them before.              Please read over the following fact sheets you were given: IF YOU HAVE QUESTIONS ABOUT YOUR PRE-OP INSTRUCTIONS PLEASE CALL [phone].   If you received a COVID test during your pre-op visit  it is requested that you wear a mask when out in public, stay away from anyone that may not be feeling well and notify your surgeon if you develop symptoms. If you test positive for Covid or have been in contact with anyone that has tested positive in the last 10 days please notify you surgeon.      Pre-operative 4 CHG Bath Instructions   You can play a key role in reducing the risk of infection after surgery. Your skin needs to be as free of germs as possible. You can reduce the number of germs on your skin by washing with CHG (chlorhexidine  gluconate) soap before surgery. CHG is an antiseptic soap that kills germs and continues to kill germs even after washing.   DO NOT use if you have an allergy to chlorhexidine /CHG or antibacterial soaps. If your skin becomes reddened or irritated, stop using the CHG and notify one of our RNs at [phone].  Please shower with the CHG soap starting 4 days before surgery using the following schedule:     Please keep in mind the following:  DO NOT shave, including legs and underarms, starting the day of your first shower.   You may shave your face at any point before/day of surgery.  Place clean sheets on your bed the day you start using CHG soap. Use a clean washcloth (not used since being washed) for each shower. DO NOT sleep with pets once you start using the CHG.   CHG Shower Instructions:  If you choose to wash your hair and private area, wash first with your normal shampoo/soap.  After you use shampoo/soap, rinse your hair and body thoroughly to remove  shampoo/soap residue.  Turn the water OFF and apply about 3 tablespoons (45 ml) of CHG soap to a CLEAN washcloth.  Apply CHG soap ONLY FROM YOUR NECK DOWN TO YOUR TOES (washing for 3-5 minutes)  DO NOT use CHG soap on face, private areas, open wounds, or sores.  Pay special attention to the area where your surgery is being performed.  If you are having back surgery, having someone wash your back for you may be helpful. Wait 2 minutes after CHG soap is applied, then you may rinse off the CHG soap.  Pat dry with a clean towel  Put on clean clothes/pajamas   If you choose to wear lotion, please use ONLY the CHG-compatible lotions on the back of this paper.     Additional instructions for the day of surgery: DO NOT APPLY any lotions, deodorants, cologne, or perfumes.   Put on clean/comfortable clothes.  Brush your teeth.  Ask your nurse before applying any prescription medications to the skin.      CHG Compatible Lotions   Aveeno Moisturizing lotion  Cetaphil Moisturizing Cream  Cetaphil Moisturizing Lotion  Clairol Herbal Essence Moisturizing Lotion, Dry Skin  Clairol Herbal Essence Moisturizing Lotion, Extra Dry Skin  Clairol Herbal Essence Moisturizing Lotion, Normal Skin  Curel Age Defying Therapeutic Moisturizing Lotion with Alpha Hydroxy  Curel Extreme Care Body Lotion  Curel Soothing Hands Moisturizing Hand Lotion  Curel Therapeutic Moisturizing Cream, Fragrance-Free  Curel Therapeutic Moisturizing Lotion, Fragrance-Free  Curel Therapeutic Moisturizing Lotion, Original Formula  Eucerin Daily Replenishing Lotion  Eucerin Dry Skin Therapy Plus Alpha Hydroxy Crme  Eucerin Dry Skin Therapy Plus Alpha Hydroxy Lotion  Eucerin Original Crme  Eucerin Original Lotion  Eucerin Plus Crme Eucerin Plus Lotion  Eucerin TriLipid Replenishing Lotion  Keri Anti-Bacterial Hand Lotion  Keri Deep Conditioning Original Lotion Dry Skin Formula Softly Scented  Keri Deep Conditioning  Original Lotion, Fragrance Free Sensitive Skin Formula  Keri Lotion Fast Absorbing Fragrance Free Sensitive Skin Formula  Keri Lotion Fast Absorbing Softly Scented Dry Skin Formula  Keri Original Lotion  Keri Skin Renewal Lotion Keri Silky Smooth Lotion  Keri Silky Smooth Sensitive Skin Lotion  Nivea Body Creamy Conditioning Oil  Nivea Body Extra Enriched Teacher, adult education Moisturizing Lotion Nivea Crme  Nivea Skin Firming Lotion  NutraDerm 30 Skin Lotion  NutraDerm Skin Lotion  NutraDerm Therapeutic Skin Cream  NutraDerm Therapeutic Skin Lotion  ProShield Protective Hand Cream  Provon moisturizing lotion

## 2024-08-25 ENCOUNTER — Encounter (HOSPITAL_COMMUNITY): Payer: Self-pay

## 2024-08-25 ENCOUNTER — Encounter (HOSPITAL_COMMUNITY)
Admission: RE | Admit: 2024-08-25 | Discharge: 2024-08-25 | Disposition: A | Discharge status: Home / Self Care | Source: Ambulatory Visit | Attending: Orthopaedic Surgery | Admitting: Orthopaedic Surgery

## 2024-08-25 ENCOUNTER — Other Ambulatory Visit: Payer: Self-pay

## 2024-08-25 VITALS — BP 112/49 | HR 64 | Temp 98.4°F | Resp 16 | Ht 61.75 in

## 2024-08-25 DIAGNOSIS — E139 Other specified diabetes mellitus without complications: Secondary | ICD-10-CM | POA: Diagnosis not present

## 2024-08-25 DIAGNOSIS — M1611 Unilateral primary osteoarthritis, right hip: Secondary | ICD-10-CM | POA: Insufficient documentation

## 2024-08-25 DIAGNOSIS — Z01818 Encounter for other preprocedural examination: Secondary | ICD-10-CM | POA: Diagnosis not present

## 2024-08-25 HISTORY — DX: Unspecified macular degeneration: H35.30

## 2024-08-25 HISTORY — DX: Malignant (primary) neoplasm, unspecified: C80.1

## 2024-08-25 LAB — BASIC METABOLIC PANEL WITH GFR
Anion gap: 11 (ref 5–15)
BUN: 18 mg/dL (ref 8–23)
CO2: 24 mmol/L (ref 22–32)
Calcium: 10.4 mg/dL — ABNORMAL HIGH (ref 8.9–10.3)
Chloride: 103 mmol/L (ref 98–111)
Creatinine, Ser: 0.95 mg/dL (ref 0.44–1.00)
GFR, Estimated: 59 mL/min — ABNORMAL LOW (ref >60)
Glucose, Bld: 126 mg/dL — ABNORMAL HIGH (ref 70–99)
Potassium: 4.6 mmol/L (ref 3.5–5.1)
Sodium: 138 mmol/L (ref 135–145)

## 2024-08-25 LAB — CBC
HCT: 39 % (ref 36.0–46.0)
Hemoglobin: 12.3 g/dL (ref 12.0–15.0)
MCH: 32.5 pg (ref 26.0–34.0)
MCHC: 31.5 g/dL (ref 30.0–36.0)
MCV: 102.9 fL — ABNORMAL HIGH (ref 80.0–100.0)
Platelets: 475 K/uL — ABNORMAL HIGH (ref 150–400)
RBC: 3.79 MIL/uL — ABNORMAL LOW (ref 3.87–5.11)
RDW: 18.6 % — ABNORMAL HIGH (ref 11.5–15.5)
WBC: 9.5 K/uL (ref 4.0–10.5)
nRBC: 0.2 % (ref 0.0–0.2)

## 2024-08-25 LAB — HEMOGLOBIN A1C
Hgb A1c MFr Bld: 6.7 % — ABNORMAL HIGH (ref 4.8–5.6)
Mean Plasma Glucose: 145.59 mg/dL

## 2024-08-25 LAB — SURGICAL PCR SCREEN
MRSA, PCR: NEGATIVE
Staphylococcus aureus: NEGATIVE

## 2024-08-25 LAB — GLUCOSE, CAPILLARY: Glucose-Capillary: 125 mg/dL — ABNORMAL HIGH (ref 70–99)

## 2024-08-25 NOTE — Telephone Encounter (Signed)
Spoke to daughter and answered her questions.

## 2024-08-26 ENCOUNTER — Encounter: Payer: Self-pay | Admitting: Orthopaedic Surgery

## 2024-08-28 DIAGNOSIS — H353212 Exudative age-related macular degeneration, right eye, with inactive choroidal neovascularization: Secondary | ICD-10-CM | POA: Diagnosis not present

## 2024-08-28 DIAGNOSIS — H35373 Puckering of macula, bilateral: Secondary | ICD-10-CM | POA: Diagnosis not present

## 2024-08-28 DIAGNOSIS — H02882 Meibomian gland dysfunction right lower eyelid: Secondary | ICD-10-CM | POA: Diagnosis not present

## 2024-08-28 DIAGNOSIS — H02885 Meibomian gland dysfunction left lower eyelid: Secondary | ICD-10-CM | POA: Diagnosis not present

## 2024-08-28 DIAGNOSIS — E119 Type 2 diabetes mellitus without complications: Secondary | ICD-10-CM | POA: Diagnosis not present

## 2024-08-28 DIAGNOSIS — H26493 Other secondary cataract, bilateral: Secondary | ICD-10-CM | POA: Diagnosis not present

## 2024-08-28 DIAGNOSIS — H18593 Other hereditary corneal dystrophies, bilateral: Secondary | ICD-10-CM | POA: Diagnosis not present

## 2024-08-28 DIAGNOSIS — H401134 Primary open-angle glaucoma, bilateral, indeterminate stage: Secondary | ICD-10-CM | POA: Diagnosis not present

## 2024-08-28 DIAGNOSIS — H353122 Nonexudative age-related macular degeneration, left eye, intermediate dry stage: Secondary | ICD-10-CM | POA: Diagnosis not present

## 2024-08-28 NOTE — Telephone Encounter (Signed)
 Please see message about postponing surgery.  She'll need go to the end of the line if we don't get any cancellations.

## 2024-08-29 ENCOUNTER — Inpatient Hospital Stay: Attending: Hematology and Oncology | Admitting: Hematology and Oncology

## 2024-08-29 ENCOUNTER — Encounter: Payer: Self-pay | Admitting: Orthopaedic Surgery

## 2024-08-29 VITALS — BP 98/56 | HR 63 | Temp 98.0°F | Resp 16 | Ht 61.75 in | Wt 143.5 lb

## 2024-08-29 DIAGNOSIS — C50412 Malignant neoplasm of upper-outer quadrant of left female breast: Secondary | ICD-10-CM | POA: Insufficient documentation

## 2024-08-29 DIAGNOSIS — Z79811 Long term (current) use of aromatase inhibitors: Secondary | ICD-10-CM | POA: Diagnosis not present

## 2024-08-29 DIAGNOSIS — Z170 Estrogen receptor positive status [ER+]: Secondary | ICD-10-CM | POA: Diagnosis not present

## 2024-08-29 NOTE — Assessment & Plan Note (Addendum)
 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  Her daughter is a Loss adjuster, chartered in [place] Return to clinic in 1 year for follow-up

## 2024-08-29 NOTE — Progress Notes (Signed)
 Patient Care Team: Elliot Charm, MD as PCP - General (Internal Medicine) Odean Potts, MD as Consulting Physician (Hematology and Oncology) Izell Domino, MD as Attending Physician (Radiation Oncology) Aron Shoulders, MD as Consulting Physician (General Surgery)  DIAGNOSIS:  Encounter Diagnosis  Name Primary?   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  The report date is November 10, 2023.  The CancerNext-Expanded gene panel offered by [facility] and includes sequencing, rearrangement, and RNA analysis for the following 76 genes: AIP, ALK, APC, ATM, AXIN2, BAP1, BARD1, BMPR1A, BRCA1, BRCA2, BRIP1, CDC73, CDH1, CDK4, CDKN1B, CDKN2A, CEBPA, CHEK2, CTNNA1, DDX41, DICER1, ETV6, FH, FLCN, GATA2, LZTR1, MAX, MBD4, MEN1, MET, MLH1, MSH2, MSH3, MSH6, MUTYH, NF1, NF2, NTHL1, PALB2, PHOX2B, PMS2, POT1, PRKAR1A, PTCH1, PTEN, RAD51C, RAD51D, RB1, RET, RUNX1, SDHA, SDHAF2, SDHB, SDHC, SDHD, SMAD4, SMARCA4, SMARCB1, SMARCE1, STK11, SUFU, TMEM127, TP53, TSC1, TSC2, VHL, and WT1 (sequencing and deletion/duplication);  EGFR, HOXB13, KIT, MITF, PDGFRA, POLD1, and POLE (sequencing only); EPCAM and GREM1 (deletion/duplication only).       CHIEF COMPLIANT:   HISTORY OF PRESENT ILLNESS: Follow-up on anastrozole  therapy  History of Present Illness Dorothy Anderson is an 83 year old female with breast cancer who presents for a follow-up visit.  She is taking anastrozole  for breast cancer prevention without experiencing side effects such as hot flashes or chest pain. Her annual mammogram is scheduled for next month at the breast center on [address] She does not have any complaints or concerns about anastrozole . She does feel slight tenderness in the left axilla.    ALLERGIES:  is allergic to amiloride, atorvastatin calcium , atorvastatin calcium , emetrol, macrobid [nitrofurantoin monohydrate macrocrystals], and propoxyphene.  MEDICATIONS:  Current Outpatient Medications  Medication Sig Dispense Refill   anastrozole  (ARIMIDEX ) 1 MG tablet Take 1 tablet (1 mg total) by mouth daily. 90 tablet 3   brimonidine (ALPHAGAN) 0.2 % ophthalmic solution Place 1 drop into both eyes in the morning and at bedtime.     Cholecalciferol  (VITAMIN D3 PO) Take 1 tablet by mouth in the morning.     CRANBERRY CONCENTRATE PO Take 4,000 mg by mouth in the morning.     docusate sodium  (COLACE) 100 MG capsule Take 100 mg by mouth every evening.     HYDROcodone -acetaminophen  (NORCO/VICODIN) 5-325 MG tablet Take 1 tablet by mouth every 6 (six) hours as needed for moderate pain (pain score 4-6). 20 tablet 0   lansoprazole (PREVACID) 15 MG capsule Take 15 mg by mouth daily.     latanoprost  (XALATAN ) 0.005 % ophthalmic solution Place 1 drop into both eyes at bedtime.     Polyethyl Glycol-Propyl Glycol (SYSTANE) 0.4-0.3 % SOLN Place 1-2  drops into both eyes 3 (three) times daily as needed (dry/irritated eyes.).     metFORMIN  (GLUCOPHAGE -XR) 500 MG 24 hr tablet Take 1,000 mg by mouth daily with supper.     metoprolol  tartrate (LOPRESSOR ) 25  MG tablet Take 25 mg by mouth 2 (two) times daily.     Multiple Vitamin (MULTIVITAMIN WITH MINERALS) TABS tablet Take 1 tablet by mouth daily.     oxybutynin  (DITROPAN ) 5 MG tablet Take 5 mg by mouth in the morning and at bedtime.     sertraline (ZOLOFT) 50 MG tablet Take 50 mg by mouth daily.     simvastatin (ZOCOR) 10 MG tablet Take 10 mg by mouth 3 (three) times a week.     trimethoprim  (TRIMPEX ) 100 MG tablet Take 100 mg by mouth at bedtime.     No current facility-administered medications for this visit.    PHYSICAL EXAMINATION: ECOG PERFORMANCE STATUS: 1 - Symptomatic but completely ambulatory  Vitals:   08/29/24 1008  BP: (!) 98/56  Pulse: 63  Resp: 16  Temp: 98 F (36.7 C)  SpO2: 98%   Filed Weights   08/29/24 1008  Weight: 143 lb 8 oz (65.1 kg)    Physical Exam No palpable lumps or nodules in bilateral breasts or axilla.  (exam performed in the presence of a chaperone)  LABORATORY DATA:  I have reviewed the data as listed    Latest Ref Rng & Units 08/25/2024    3:40 PM 02/07/2021   11:29 AM 04/14/2019    6:23 AM  CMP  Glucose 70 - 99 mg/dL 873  874  837   BUN 8 - 23 mg/dL 18  20  20    Creatinine 0.44 - 1.00 mg/dL 9.04  9.09  8.76   Sodium 135 - 145 mmol/L 138  138  141   Potassium 3.5 - 5.1 mmol/L 4.6  4.4  3.7   Chloride 98 - 111 mmol/L 103  106  107   CO2 22 - 32 mmol/L 24  25  23    Calcium  8.9 - 10.3 mg/dL 89.5  9.3  9.4   Total Protein 6.5 - 8.1 g/dL   5.9   Total Bilirubin 0.3 - 1.2 mg/dL   1.4   Alkaline Phos 38 - 126 U/L   50   AST 15 - 41 U/L   26   ALT 0 - 44 U/L   13     Lab Results  Component Value Date   WBC 9.5 08/25/2024   HGB 12.3 08/25/2024   HCT 39.0 08/25/2024   MCV 102.9 (H) 08/25/2024   PLT 475 (H) 08/25/2024   NEUTROABS 4.7 04/14/2019    ASSESSMENT & PLAN:  Malignant neoplasm of upper-outer quadrant of left breast in female, estrogen receptor positive (HCC) 09/29/2023: Left lumpectomy: Solid papillary carcinoma with invasion  showing extracellular mucin, grade 2, 1.3 cm, margins negative, negative for angiolymphatic invasion, 0/4 lymph node negative, ER 100%, PR 30%, HER2 0 negative, Ki-67 10%   Treatment plan: +/- Adjuvant radiation (patient decided to forego radiation) Adjuvant antiestrogen therapy with anastrozole  1 mg daily x 5 years started 10/25/2023   Anastrozole  toxicities: Tolerating it extremely well without any problems or concerns.  Breast cancer surveillance: Breast exam 08/29/2024: Benign Mammogram had been ordered but has not been done.  Her daughter is a Loss adjuster, chartered in [place] Return to clinic in 1 year for follow-up     No orders of the defined types were placed in this encounter.  The patient has a good understanding of the overall plan. she agrees with it. she will call with any problems that may develop before the next visit here.  I personally spent a total of 30 minutes in the care of the patient today including preparing to see the patient, getting/reviewing separately obtained history, performing a medically appropriate exam/evaluation, counseling and educating, placing orders, referring and communicating with other health care professionals, documenting clinical information in the EHR, independently interpreting results, communicating results, and coordinating care.   Viinay K Dhanvi Boesen, MD 08/29/24

## 2024-08-30 ENCOUNTER — Ambulatory Visit: Admitting: Hematology and Oncology

## 2024-08-31 ENCOUNTER — Other Ambulatory Visit: Payer: Self-pay | Admitting: Physician Assistant

## 2024-08-31 ENCOUNTER — Encounter: Payer: Self-pay | Admitting: Internal Medicine

## 2024-08-31 ENCOUNTER — Other Ambulatory Visit: Payer: Self-pay

## 2024-08-31 ENCOUNTER — Ambulatory Visit: Admitting: Internal Medicine

## 2024-08-31 VITALS — BP 111/67 | HR 54 | Temp 97.6°F

## 2024-08-31 DIAGNOSIS — N390 Urinary tract infection, site not specified: Secondary | ICD-10-CM | POA: Diagnosis not present

## 2024-08-31 MED ORDER — DOCUSATE SODIUM 100 MG PO CAPS: 100.0000 mg | ORAL_CAPSULE | Freq: Every day | ORAL | 2 refills | Status: DC | PRN

## 2024-08-31 MED ORDER — ONDANSETRON HCL 4 MG PO TABS: 4.0000 mg | ORAL_TABLET | Freq: Three times a day (TID) | ORAL | 0 refills | Status: AC | PRN

## 2024-08-31 MED ORDER — METHOCARBAMOL 500 MG PO TABS
500.0000 mg | ORAL_TABLET | Freq: Two times a day (BID) | ORAL | 2 refills | Status: AC | PRN
Start: 2024-08-31 — End: ?

## 2024-08-31 MED ORDER — HYDROCODONE-ACETAMINOPHEN 5-325 MG PO TABS: 1.0000 | ORAL_TABLET | Freq: Three times a day (TID) | ORAL | 0 refills | Status: DC | PRN

## 2024-08-31 MED ORDER — ASPIRIN 81 MG PO CHEW: 81.0000 mg | CHEWABLE_TABLET | Freq: Two times a day (BID) | ORAL | 0 refills | Status: DC

## 2024-08-31 MED ORDER — DOXYCYCLINE HYCLATE 100 MG PO TABS: 100.0000 mg | ORAL_TABLET | Freq: Two times a day (BID) | ORAL | 0 refills | Status: DC

## 2024-08-31 NOTE — Progress Notes (Signed)
 Patient Active Problem List   Diagnosis Date Noted   Stress fracture of neck of right femur 07/14/2024   Primary osteoarthritis of right hip 07/14/2024   Nasal bones, closed fracture 12/26/2023   Genetic testing 11/11/2023   Malignant neoplasm of upper-outer quadrant of left breast in female, estrogen receptor positive (HCC) 10/22/2023   Family history of breast cancer    Chronic kidney disease, stage 2 (mild) 04/22/2021   Degeneration of lumbar intervertebral disc 04/22/2021   Osteopenia 04/22/2021   Abnormal findings on diagnostic imaging of other specified body structures 10/26/2020   Body mass index (BMI) 29.0-29.9, adult 10/26/2020   Esophageal reflux 10/26/2020   Overactive bladder 10/26/2020   History of colonic polyps 10/26/2020   Ptosis of both eyelids 01/28/2015   Diabetes type 2, controlled (HCC) 10/25/2014   High blood pressure 10/25/2014   Osteoarthritis of left knee 01/17/2013    Class: Diagnosis of   Osteoarthrosis involving lower leg 01/17/2013    Patient's Medications  New Prescriptions   No medications on file  Previous Medications   ANASTROZOLE  (ARIMIDEX ) 1 MG TABLET    Take 1 tablet (1 mg total) by mouth daily.   ASPIRIN (ASPIRIN CHILDRENS) 81 MG CHEWABLE TABLET    Chew 1 tablet (81 mg total) by mouth 2 (two) times daily. To be taken after surgery to prevent blood clots   BRIMONIDINE (ALPHAGAN) 0.2 % OPHTHALMIC SOLUTION    Place 1 drop into both eyes in the morning and at bedtime.   CHOLECALCIFEROL  (VITAMIN D3 PO)    Take 1 tablet by mouth in the morning.   CRANBERRY CONCENTRATE PO    Take 4,000 mg by mouth in the morning.   DOCUSATE SODIUM  (COLACE) 100 MG CAPSULE    Take 100 mg by mouth every evening.   DOCUSATE SODIUM  (COLACE) 100 MG CAPSULE    Take 1 capsule (100 mg total) by mouth daily as needed.   DOXYCYCLINE (VIBRA-TABS) 100 MG TABLET    Take 1 tablet (100 mg total) by mouth 2 (two) times daily. To be taken after surgery    HYDROCODONE -ACETAMINOPHEN  (NORCO/VICODIN) 5-325 MG TABLET    Take 1 tablet by mouth 3 (three) times daily as needed for moderate pain (pain score 4-6). To be taken after surgery   LANSOPRAZOLE (PREVACID) 15 MG CAPSULE    Take 15 mg by mouth daily.   LATANOPROST  (XALATAN ) 0.005 % OPHTHALMIC SOLUTION    Place 1 drop into both eyes at bedtime.   METFORMIN  (GLUCOPHAGE -XR) 500 MG 24 HR TABLET    Take 1,000 mg by mouth daily with supper.   METHOCARBAMOL  (ROBAXIN ) 500 MG TABLET    Take 1 tablet (500 mg total) by mouth 2 (two) times daily as needed.   METOPROLOL  TARTRATE (LOPRESSOR ) 25 MG TABLET    Take 25 mg by mouth 2 (two) times daily.   MULTIPLE VITAMIN (MULTIVITAMIN WITH MINERALS) TABS TABLET    Take 1 tablet by mouth daily.   ONDANSETRON  (ZOFRAN ) 4 MG TABLET    Take 1 tablet (4 mg total) by mouth every 8 (eight) hours as needed for nausea or vomiting.   OXYBUTYNIN  (DITROPAN ) 5 MG TABLET    Take 5 mg by mouth in the morning and at bedtime.   POLYETHYL GLYCOL-PROPYL GLYCOL (SYSTANE) 0.4-0.3 % SOLN    Place 1-2 drops into both eyes 3 (three) times daily as needed (dry/irritated eyes.).   SERTRALINE (ZOLOFT) 50 MG TABLET  Take 50 mg by mouth daily.   SIMVASTATIN (ZOCOR) 10 MG TABLET    Take 10 mg by mouth 3 (three) times a week.   TRIMETHOPRIM  (TRIMPEX ) 100 MG TABLET    Take 100 mg by mouth at bedtime.  Modified Medications   No medications on file  Discontinued Medications   No medications on file    Subjective: 83 YF with Mhx of CKD, overactive bladder, diabetes type 2, hypertension, primary osteoarthritis of right hip, osteoarthritis left knee, presents for MDR Klebsiella management.  Patient took a course of fluconazole.  She was previously on trimethoprim  for suppression, and Klebsiella is resistant.  She was referred to infectious disease given her upcoming appointment for right total hip arthroplasty.  She presents with family member, who contributes to history.  Today does not appear to have  any urinary complaints.  They are concerned about positive urine cultures given upcoming operative procedure. Review of Systems: Review of Systems  All other systems reviewed and are negative.   Past Medical History:  Diagnosis Date   Anxiety    Arthritis KNEES   OA   Calculus of left kidney    Cancer (HCC)    left breast cancer   Complication of anesthesia    slow to wake up   Depression    Diabetes mellitus without complication (HCC)    PRE DIABETIC ORAL MEDS    Family history of breast cancer    Frequency of urination    GERD (gastroesophageal reflux disease)    Glaucoma BILATERAL EYES   History of kidney stones    Hydronephrosis, left    DR. STEVEN DAHLSTADT   Hyperglycemia    Hypertension    Macular degeneration    Nocturia    Wears glasses     Social History   Tobacco Use   Smoking status: Never   Smokeless tobacco: Never  Vaping Use   Vaping status: Never Used  Substance Use Topics   Alcohol use: No   Drug use: No    Family History  Problem Relation Age of Onset   CVA Mother    Heart attack Father    Breast cancer Sister 31   Breast cancer Niece 33    Allergies  Allergen Reactions   Amiloride Other (See Comments)    High blood level of potassium  Other Reaction(s): high blood level of potassium   Atorvastatin Calcium  Other (See Comments)    Muscle weakness and pain   Atorvastatin Calcium      Other Reaction(s): muscle weakness and pain   Emetrol     Other reaction(s): Unknown   Macrobid [Nitrofurantoin Monohydrate Macrocrystals] Nausea And Vomiting   Propoxyphene Other (See Comments)    MADE PT STAY AWAKE    Health Maintenance  Topic Date Due   Diabetic kidney evaluation - Urine ACR  Never done   Mammogram  Never done   Zoster Vaccines- Shingrix (2 of 2) 09/04/2019   Influenza Vaccine  06/09/2024   COVID-19 Vaccine (7 - 2025-26 season) 07/10/2024   FOOT EXAM  01/24/2025   Medicare Annual Wellness (AWV)  02/06/2025   HEMOGLOBIN A1C   02/23/2025   OPHTHALMOLOGY EXAM  04/12/2025   Diabetic kidney evaluation - eGFR measurement  08/25/2025   DTaP/Tdap/Td (3 - Td or Tdap) 11/26/2031   Pneumococcal Vaccine: 50+ Years  Completed   DEXA SCAN  Completed   Meningococcal B Vaccine  Aged Out    Objective:  Vitals:   08/31/24 1353  BP: 111/67  Pulse: (!) 54  Temp: 97.6 F (36.4 C)  TempSrc: Oral  SpO2: 100%   There is no height or weight on file to calculate BMI.  Physical Exam Constitutional:      Appearance: Normal appearance.  HENT:     Head: Normocephalic and atraumatic.     Right Ear: Tympanic membrane normal.     Left Ear: Tympanic membrane normal.     Nose: Nose normal.     Mouth/Throat:     Mouth: Mucous membranes are moist.  Eyes:     Extraocular Movements: Extraocular movements intact.     Conjunctiva/sclera: Conjunctivae normal.     Pupils: Pupils are equal, round, and reactive to light.  Cardiovascular:     Rate and Rhythm: Normal rate and regular rhythm.     Heart sounds: No murmur heard.    No friction rub. No gallop.  Pulmonary:     Effort: Pulmonary effort is normal.     Breath sounds: Normal breath sounds.  Abdominal:     General: Abdomen is flat.     Palpations: Abdomen is soft.  Skin:    General: Skin is warm and dry.  Neurological:     General: No focal deficit present.     Mental Status: She is alert.  Psychiatric:        Mood and Affect: Mood normal.    Lab Results Lab Results  Component Value Date   WBC 9.5 08/25/2024   HGB 12.3 08/25/2024   HCT 39.0 08/25/2024   MCV 102.9 (H) 08/25/2024   PLT 475 (H) 08/25/2024    Lab Results  Component Value Date   CREATININE 0.95 08/25/2024   BUN 18 08/25/2024   NA 138 08/25/2024   K 4.6 08/25/2024   CL 103 08/25/2024   CO2 24 08/25/2024    Lab Results  Component Value Date   ALT 13 04/14/2019   AST 26 04/14/2019   ALKPHOS 50 04/14/2019   BILITOT 1.4 (H) 04/14/2019    No results found for: CHOL, HDL, LDLCALC,  LDLDIRECT, TRIG, CHOLHDL No results found for: LABRPR, RPRTITER No results found for: HIV1RNAQUANT, HIV1RNAVL, CD4TABS   Problem List Items Addressed This Visit   None  Results                Assessment/Plan #Hx of recurrent UTIs.  #OAB #CKD Review of urine cultures:  12/23/2023 E. coli resistant to ampicillin, intermediate to Augmentin, resistant cefazolin  01/12/2024 E. coli same resistance pattern same as aboce 02/23/24 Pseudomonas aeruginosa pansensitive 04/22/2024 E faecalis intermediate or linezolid, tetracycline resistant. Revie 83 year old female with past medical history of wed urology note: pt did not have urinary sympotms son 6/10 08/11/2024 Klebsiella pneumoniae resistant to ciprofloxacin, Augmentin, ampicillin, cefazolin , cefepime, Cipro oxidation, gentamicin, Levaquin , tetracycline, tobramycin, Bactrim , intermediate to nitrofurantoin and pip-tazo Avoid screening for asymptomatic bacteruria.  Klebsiella pneumonia resistant to Bactrim , okay to complete fosfomycin as patient has. No urinary complaints today.  Given culture history limited choices for suppressive antibiotics as urine cx show different sp of bacteria including ecoli, Pseudomonas now MDRO  Klebsiella pneumonia. The role of trimethoprim  suppression is unclear given R Kleb.  Discussed symptoms of UTI with patient.  Treatment of asymptomatic bacteriuria from nonurological procedures is generally not indicated. Continue follow-up with urology. F/U with ID PRN      Loney Stank, MD [facility] 08/31/2024, 2:02 PM   I have personally spent 65 minutes involved in face-to-face and non-face-to-face activities  for this patient on the day of the visit. Professional time spent includes the following activities: Preparing to see the patient (review of tests), Obtaining and/or reviewing separately obtained history (admission/discharge record),  Performing a medically appropriate examination and/or evaluation , Ordering medications/tests/procedures, referring and communicating with other health care professionals, Documenting clinical information in the EMR, Independently interpreting results (not separately reported), Communicating results to the patient/family/caregiver, Counseling and educating the patient/family/caregiver and Care coordination (not separately reported).

## 2024-08-31 NOTE — Patient Instructions (Addendum)
 Avoid screening for asymptomatic bacteruria.  Klebsiella pneumonia resistant to Bactrim , okay to complete fosfomycin as patient has.  Given culture history limited role for suppressive antibiotics as she has grown Pseudomonas now MDRO Bactrim  resistant Klebsiella pneumonia. Discussed symptoms of UTI with patient.  Treatment of asymptomatic bacteriuria from nonurological procedures is generally not indicated. Continue follow-up with urology.

## 2024-09-01 ENCOUNTER — Encounter (HOSPITAL_COMMUNITY): Payer: Self-pay | Admitting: Orthopaedic Surgery

## 2024-09-01 ENCOUNTER — Ambulatory Visit (HOSPITAL_COMMUNITY)

## 2024-09-01 ENCOUNTER — Observation Stay (HOSPITAL_COMMUNITY)

## 2024-09-01 ENCOUNTER — Inpatient Hospital Stay (HOSPITAL_COMMUNITY)
Admission: RE | Admit: 2024-09-01 | Discharge: 2024-09-05 | DRG: 522 | Disposition: A | Discharge status: Skilled Nursing Facility | Attending: Orthopaedic Surgery | Admitting: Orthopaedic Surgery

## 2024-09-01 ENCOUNTER — Ambulatory Visit (HOSPITAL_COMMUNITY): Payer: Self-pay | Admitting: Physician Assistant

## 2024-09-01 ENCOUNTER — Ambulatory Visit (HOSPITAL_COMMUNITY): Payer: Self-pay | Admitting: Anesthesiology

## 2024-09-01 ENCOUNTER — Encounter (HOSPITAL_COMMUNITY)
Admission: RE | Disposition: A | Discharge status: Skilled Nursing Facility | Payer: Self-pay | Source: Home / Self Care | Attending: Orthopaedic Surgery

## 2024-09-01 DIAGNOSIS — Z888 Allergy status to other drugs, medicaments and biological substances status: Secondary | ICD-10-CM | POA: Diagnosis not present

## 2024-09-01 DIAGNOSIS — H04123 Dry eye syndrome of bilateral lacrimal glands: Secondary | ICD-10-CM | POA: Diagnosis not present

## 2024-09-01 DIAGNOSIS — N3289 Other specified disorders of bladder: Secondary | ICD-10-CM | POA: Diagnosis not present

## 2024-09-01 DIAGNOSIS — Z803 Family history of malignant neoplasm of breast: Secondary | ICD-10-CM

## 2024-09-01 DIAGNOSIS — Z79811 Long term (current) use of aromatase inhibitors: Secondary | ICD-10-CM | POA: Diagnosis not present

## 2024-09-01 DIAGNOSIS — M84451A Pathological fracture, right femur, initial encounter for fracture: Secondary | ICD-10-CM | POA: Diagnosis not present

## 2024-09-01 DIAGNOSIS — Z96652 Presence of left artificial knee joint: Secondary | ICD-10-CM | POA: Diagnosis present

## 2024-09-01 DIAGNOSIS — M84351D Stress fracture, right femur, subsequent encounter for fracture with routine healing: Secondary | ICD-10-CM | POA: Diagnosis not present

## 2024-09-01 DIAGNOSIS — H409 Unspecified glaucoma: Secondary | ICD-10-CM | POA: Diagnosis present

## 2024-09-01 DIAGNOSIS — M62838 Other muscle spasm: Secondary | ICD-10-CM | POA: Diagnosis not present

## 2024-09-01 DIAGNOSIS — Z961 Presence of intraocular lens: Secondary | ICD-10-CM | POA: Diagnosis present

## 2024-09-01 DIAGNOSIS — Z853 Personal history of malignant neoplasm of breast: Secondary | ICD-10-CM

## 2024-09-01 DIAGNOSIS — M1611 Unilateral primary osteoarthritis, right hip: Principal | ICD-10-CM

## 2024-09-01 DIAGNOSIS — Z79899 Other long term (current) drug therapy: Secondary | ICD-10-CM | POA: Diagnosis not present

## 2024-09-01 DIAGNOSIS — C50412 Malignant neoplasm of upper-outer quadrant of left female breast: Secondary | ICD-10-CM | POA: Diagnosis not present

## 2024-09-01 DIAGNOSIS — Z87442 Personal history of urinary calculi: Secondary | ICD-10-CM

## 2024-09-01 DIAGNOSIS — Z471 Aftercare following joint replacement surgery: Secondary | ICD-10-CM | POA: Diagnosis not present

## 2024-09-01 DIAGNOSIS — Z9841 Cataract extraction status, right eye: Secondary | ICD-10-CM

## 2024-09-01 DIAGNOSIS — D62 Acute posthemorrhagic anemia: Secondary | ICD-10-CM | POA: Diagnosis not present

## 2024-09-01 DIAGNOSIS — K219 Gastro-esophageal reflux disease without esophagitis: Secondary | ICD-10-CM | POA: Diagnosis present

## 2024-09-01 DIAGNOSIS — M84351A Stress fracture, right femur, initial encounter for fracture: Principal | ICD-10-CM | POA: Diagnosis present

## 2024-09-01 DIAGNOSIS — M25551 Pain in right hip: Secondary | ICD-10-CM | POA: Diagnosis not present

## 2024-09-01 DIAGNOSIS — Z9842 Cataract extraction status, left eye: Secondary | ICD-10-CM | POA: Diagnosis not present

## 2024-09-01 DIAGNOSIS — Z01818 Encounter for other preprocedural examination: Secondary | ICD-10-CM

## 2024-09-01 DIAGNOSIS — Z881 Allergy status to other antibiotic agents status: Secondary | ICD-10-CM

## 2024-09-01 DIAGNOSIS — I10 Essential (primary) hypertension: Secondary | ICD-10-CM | POA: Diagnosis present

## 2024-09-01 DIAGNOSIS — Z96641 Presence of right artificial hip joint: Secondary | ICD-10-CM | POA: Diagnosis not present

## 2024-09-01 DIAGNOSIS — Z7401 Bed confinement status: Secondary | ICD-10-CM | POA: Diagnosis not present

## 2024-09-01 DIAGNOSIS — F32A Depression, unspecified: Secondary | ICD-10-CM | POA: Diagnosis present

## 2024-09-01 DIAGNOSIS — Z7984 Long term (current) use of oral hypoglycemic drugs: Secondary | ICD-10-CM | POA: Diagnosis not present

## 2024-09-01 DIAGNOSIS — R112 Nausea with vomiting, unspecified: Secondary | ICD-10-CM | POA: Diagnosis not present

## 2024-09-01 DIAGNOSIS — E119 Type 2 diabetes mellitus without complications: Secondary | ICD-10-CM | POA: Diagnosis present

## 2024-09-01 DIAGNOSIS — M170 Bilateral primary osteoarthritis of knee: Secondary | ICD-10-CM | POA: Diagnosis present

## 2024-09-01 DIAGNOSIS — H9193 Unspecified hearing loss, bilateral: Secondary | ICD-10-CM | POA: Diagnosis not present

## 2024-09-01 DIAGNOSIS — F419 Anxiety disorder, unspecified: Secondary | ICD-10-CM | POA: Diagnosis present

## 2024-09-01 DIAGNOSIS — Z8249 Family history of ischemic heart disease and other diseases of the circulatory system: Secondary | ICD-10-CM

## 2024-09-01 DIAGNOSIS — K5900 Constipation, unspecified: Secondary | ICD-10-CM | POA: Diagnosis not present

## 2024-09-01 DIAGNOSIS — I959 Hypotension, unspecified: Secondary | ICD-10-CM | POA: Diagnosis not present

## 2024-09-01 DIAGNOSIS — Z87440 Personal history of urinary (tract) infections: Secondary | ICD-10-CM | POA: Diagnosis not present

## 2024-09-01 DIAGNOSIS — R197 Diarrhea, unspecified: Secondary | ICD-10-CM | POA: Diagnosis not present

## 2024-09-01 HISTORY — PX: TOTAL HIP ARTHROPLASTY: SHX124

## 2024-09-01 LAB — TYPE AND SCREEN
ABO/RH(D): B POS
Antibody Screen: NEGATIVE

## 2024-09-01 LAB — GLUCOSE, CAPILLARY
Glucose-Capillary: 192 mg/dL — ABNORMAL HIGH (ref 70–99)
Glucose-Capillary: 80 mg/dL (ref 70–99)
Glucose-Capillary: 167 mg/dL — ABNORMAL HIGH (ref 70–99)
Glucose-Capillary: 250 mg/dL — ABNORMAL HIGH (ref 70–99)

## 2024-09-01 SURGERY — ARTHROPLASTY, HIP, TOTAL, ANTERIOR APPROACH
Anesthesia: Monitor Anesthesia Care | Site: Hip | Laterality: Right

## 2024-09-01 MED ORDER — PROPOFOL 1000 MG/100ML IV EMUL
INTRAVENOUS | Status: AC
  Filled 2024-09-01: qty 200

## 2024-09-01 MED ORDER — ACETAMINOPHEN 10 MG/ML IV SOLN
1000.0000 mg | Freq: Once | INTRAVENOUS | Status: DC | PRN
  Administered 2024-09-01: 1000 mg via INTRAVENOUS

## 2024-09-01 MED ORDER — ONDANSETRON HCL 4 MG/2ML IJ SOLN
INTRAMUSCULAR | Status: AC
  Filled 2024-09-01: qty 2

## 2024-09-01 MED ORDER — FENTANYL CITRATE (PF) 50 MCG/ML IJ SOSY
PREFILLED_SYRINGE | INTRAMUSCULAR | Status: AC
  Filled 2024-09-01: qty 2

## 2024-09-01 MED ORDER — METHOCARBAMOL 1000 MG/10ML IJ SOLN
500.0000 mg | Freq: Four times a day (QID) | INTRAMUSCULAR | Status: DC | PRN
  Administered 2024-09-01: 500 mg via INTRAVENOUS

## 2024-09-01 MED ORDER — OXYCODONE HCL 5 MG/5ML PO SOLN: 5.0000 mg | Freq: Once | ORAL | Status: DC | PRN

## 2024-09-01 MED ORDER — OXYCODONE HCL 5 MG/5ML PO SOLN
ORAL | Status: AC
  Filled 2024-09-01: qty 5

## 2024-09-01 MED ORDER — DEXAMETHASONE SOD PHOSPHATE PF 10 MG/ML IJ SOLN
10.0000 mg | Freq: Once | INTRAMUSCULAR | Status: AC
  Administered 2024-09-02: 10 mg via INTRAVENOUS

## 2024-09-01 MED ORDER — CHLORHEXIDINE GLUCONATE 0.12 % MT SOLN
15.0000 mL | Freq: Once | OROMUCOSAL | Status: AC
  Administered 2024-09-01: 15 mL via OROMUCOSAL

## 2024-09-01 MED ORDER — BUPIVACAINE-MELOXICAM ER 200-6 MG/7ML IJ SOLN
INTRAMUSCULAR | Status: DC | PRN
  Administered 2024-09-01: 400 mg

## 2024-09-01 MED ORDER — TRANEXAMIC ACID-NACL 1000-0.7 MG/100ML-% IV SOLN
1000.0000 mg | Freq: Once | INTRAVENOUS | Status: AC
  Administered 2024-09-01: 1000 mg via INTRAVENOUS
  Filled 2024-09-01: qty 100

## 2024-09-01 MED ORDER — FENTANYL CITRATE (PF) 100 MCG/2ML IJ SOLN
INTRAMUSCULAR | Status: AC
  Filled 2024-09-01: qty 2

## 2024-09-01 MED ORDER — PROPOFOL 500 MG/50ML IV EMUL
INTRAVENOUS | Status: DC | PRN
  Administered 2024-09-01: 25 ug/kg/min via INTRAVENOUS

## 2024-09-01 MED ORDER — MORPHINE SULFATE (PF) 2 MG/ML IV SOLN
0.5000 mg | Freq: Four times a day (QID) | INTRAVENOUS | Status: DC | PRN
  Administered 2024-09-01: 0.5 mg via INTRAVENOUS
  Filled 2024-09-01: qty 1

## 2024-09-01 MED ORDER — ROCURONIUM BROMIDE 100 MG/10ML IV SOLN
INTRAVENOUS | Status: DC | PRN
Start: 2024-09-01 — End: 2024-09-01
  Administered 2024-09-01: 50 mg via INTRAVENOUS

## 2024-09-01 MED ORDER — PHENYLEPHRINE 80 MCG/ML (10ML) SYRINGE FOR IV PUSH (FOR BLOOD PRESSURE SUPPORT)
PREFILLED_SYRINGE | INTRAVENOUS | Status: DC | PRN
  Administered 2024-09-01 (×3): 240 ug via INTRAVENOUS

## 2024-09-01 MED ORDER — PANTOPRAZOLE SODIUM 40 MG PO TBEC
40.0000 mg | DELAYED_RELEASE_TABLET | Freq: Every day | ORAL | Status: DC
Start: 2024-09-01 — End: 2024-09-05
  Administered 2024-09-01 – 2024-09-05 (×5): 40 mg via ORAL
  Filled 2024-09-01 (×5): qty 1

## 2024-09-01 MED ORDER — ACETAMINOPHEN 500 MG PO TABS
1000.0000 mg | ORAL_TABLET | Freq: Four times a day (QID) | ORAL | Status: AC
  Administered 2024-09-02 (×2): 1000 mg via ORAL
  Filled 2024-09-01 (×2): qty 2

## 2024-09-01 MED ORDER — PROPOFOL 10 MG/ML IV BOLUS
INTRAVENOUS | Status: DC | PRN
  Administered 2024-09-01: 100 mg via INTRAVENOUS
  Administered 2024-09-01: 20 mg via INTRAVENOUS

## 2024-09-01 MED ORDER — INSULIN ASPART 100 UNIT/ML IJ SOLN
0.0000 [IU] | INTRAMUSCULAR | Status: DC | PRN
  Administered 2024-09-01: 2 [IU] via SUBCUTANEOUS
  Filled 2024-09-01: qty 1

## 2024-09-01 MED ORDER — TRANEXAMIC ACID 1000 MG/10ML IV SOLN
INTRAVENOUS | Status: DC | PRN
  Administered 2024-09-01: 2000 mg via TOPICAL

## 2024-09-01 MED ORDER — OXYCODONE HCL 5 MG PO TABS: 5.0000 mg | ORAL_TABLET | Freq: Once | ORAL | Status: DC | PRN

## 2024-09-01 MED ORDER — SUGAMMADEX SODIUM 200 MG/2ML IV SOLN
INTRAVENOUS | Status: AC
  Filled 2024-09-01: qty 2

## 2024-09-01 MED ORDER — POLYETHYLENE GLYCOL 3350 17 G PO PACK
17.0000 g | PACK | Freq: Every day | ORAL | Status: DC
  Administered 2024-09-01 – 2024-09-05 (×5): 17 g via ORAL
  Filled 2024-09-01 (×5): qty 1

## 2024-09-01 MED ORDER — OXYCODONE HCL 5 MG PO TABS: 5.0000 mg | ORAL_TABLET | Freq: Once | ORAL | Status: DC

## 2024-09-01 MED ORDER — DOCUSATE SODIUM 100 MG PO CAPS
100.0000 mg | ORAL_CAPSULE | Freq: Two times a day (BID) | ORAL | Status: DC
Start: 2024-09-01 — End: 2024-09-05
  Administered 2024-09-01 – 2024-09-05 (×8): 100 mg via ORAL
  Filled 2024-09-01 (×8): qty 1

## 2024-09-01 MED ORDER — CEFAZOLIN SODIUM-DEXTROSE 2-4 GM/100ML-% IV SOLN
2.0000 g | INTRAVENOUS | Status: AC
  Administered 2024-09-01: 2 g via INTRAVENOUS
  Filled 2024-09-01: qty 100

## 2024-09-01 MED ORDER — POVIDONE-IODINE 10 % EX SWAB
2.0000 | Freq: Once | CUTANEOUS | Status: AC
  Administered 2024-09-01: 2 via TOPICAL

## 2024-09-01 MED ORDER — DIPHENHYDRAMINE HCL 12.5 MG/5ML PO ELIX: 25.0000 mg | ORAL_SOLUTION | ORAL | Status: DC | PRN

## 2024-09-01 MED ORDER — METHOCARBAMOL 500 MG PO TABS
500.0000 mg | ORAL_TABLET | Freq: Four times a day (QID) | ORAL | Status: DC | PRN
  Administered 2024-09-02 – 2024-09-05 (×7): 500 mg via ORAL
  Filled 2024-09-01 (×7): qty 1

## 2024-09-01 MED ORDER — CEFAZOLIN SODIUM-DEXTROSE 2-4 GM/100ML-% IV SOLN
2.0000 g | Freq: Four times a day (QID) | INTRAVENOUS | Status: AC
  Administered 2024-09-01 (×2): 2 g via INTRAVENOUS
  Filled 2024-09-01 (×2): qty 100

## 2024-09-01 MED ORDER — ISOPROPYL ALCOHOL 70 % SOLN
Status: DC | PRN
  Administered 2024-09-01: 1 via TOPICAL

## 2024-09-01 MED ORDER — ACETAMINOPHEN 10 MG/ML IV SOLN
INTRAVENOUS | Status: AC
  Filled 2024-09-01: qty 100

## 2024-09-01 MED ORDER — INSULIN ASPART 100 UNIT/ML IJ SOLN
0.0000 [IU] | Freq: Every day | INTRAMUSCULAR | Status: DC
  Administered 2024-09-01: 2 [IU] via SUBCUTANEOUS

## 2024-09-01 MED ORDER — SUGAMMADEX SODIUM 200 MG/2ML IV SOLN
INTRAVENOUS | Status: DC | PRN
  Administered 2024-09-01: 200 mg via INTRAVENOUS

## 2024-09-01 MED ORDER — METHOCARBAMOL 1000 MG/10ML IJ SOLN
INTRAMUSCULAR | Status: AC
  Filled 2024-09-01: qty 10

## 2024-09-01 MED ORDER — ONDANSETRON HCL 4 MG/2ML IJ SOLN: 4.0000 mg | Freq: Four times a day (QID) | INTRAMUSCULAR | Status: DC | PRN

## 2024-09-01 MED ORDER — PHENYLEPHRINE 80 MCG/ML (10ML) SYRINGE FOR IV PUSH (FOR BLOOD PRESSURE SUPPORT)
PREFILLED_SYRINGE | INTRAVENOUS | Status: AC
  Filled 2024-09-01: qty 10

## 2024-09-01 MED ORDER — PRONTOSAN WOUND IRRIGATION OPTIME
TOPICAL | Status: DC | PRN
  Administered 2024-09-01: 1

## 2024-09-01 MED ORDER — PHENOL 1.4 % MT LIQD: 1.0000 | OROMUCOSAL | Status: DC | PRN

## 2024-09-01 MED ORDER — ORAL CARE MOUTH RINSE: 15.0000 mL | Freq: Once | OROMUCOSAL | Status: AC

## 2024-09-01 MED ORDER — 0.9 % SODIUM CHLORIDE (POUR BTL) OPTIME
TOPICAL | Status: DC | PRN
  Administered 2024-09-01: 1000 mL

## 2024-09-01 MED ORDER — INSULIN ASPART 100 UNIT/ML IJ SOLN
0.0000 [IU] | Freq: Three times a day (TID) | INTRAMUSCULAR | Status: DC
  Administered 2024-09-01: 3 [IU] via SUBCUTANEOUS
  Administered 2024-09-02: 5 [IU] via SUBCUTANEOUS
  Administered 2024-09-02: 8 [IU] via SUBCUTANEOUS
  Administered 2024-09-02: 3 [IU] via SUBCUTANEOUS
  Administered 2024-09-03: 2 [IU] via SUBCUTANEOUS
  Administered 2024-09-03: 5 [IU] via SUBCUTANEOUS
  Administered 2024-09-04 – 2024-09-05 (×4): 3 [IU] via SUBCUTANEOUS

## 2024-09-01 MED ORDER — TRANEXAMIC ACID 1000 MG/10ML IV SOLN
2000.0000 mg | INTRAVENOUS | Status: DC
  Filled 2024-09-01: qty 20

## 2024-09-01 MED ORDER — METOCLOPRAMIDE HCL 5 MG PO TABS: 5.0000 mg | ORAL_TABLET | Freq: Three times a day (TID) | ORAL | Status: DC | PRN

## 2024-09-01 MED ORDER — LACTATED RINGERS IV SOLN: INTRAVENOUS | Status: DC

## 2024-09-01 MED ORDER — SODIUM CHLORIDE 0.9 % IV SOLN: INTRAVENOUS | Status: DC

## 2024-09-01 MED ORDER — BUPIVACAINE-MELOXICAM ER 400-12 MG/14ML IJ SOLN
INTRAMUSCULAR | Status: AC
Start: 2024-09-01 — End: 2024-09-01
  Filled 2024-09-01: qty 1

## 2024-09-01 MED ORDER — MENTHOL 3 MG MT LOZG: 1.0000 | LOZENGE | OROMUCOSAL | Status: DC | PRN

## 2024-09-01 MED ORDER — EPHEDRINE 5 MG/ML INJ
INTRAVENOUS | Status: AC
  Filled 2024-09-01: qty 5

## 2024-09-01 MED ORDER — ASPIRIN 81 MG PO CHEW
81.0000 mg | CHEWABLE_TABLET | Freq: Two times a day (BID) | ORAL | Status: DC
Start: 2024-09-01 — End: 2024-09-05
  Administered 2024-09-01 – 2024-09-05 (×8): 81 mg via ORAL
  Filled 2024-09-01 (×8): qty 1

## 2024-09-01 MED ORDER — VANCOMYCIN HCL 1000 MG IV SOLR
INTRAVENOUS | Status: AC
Start: 2024-09-01 — End: 2024-09-01
  Filled 2024-09-01: qty 20

## 2024-09-01 MED ORDER — ONDANSETRON HCL 4 MG/2ML IJ SOLN
INTRAMUSCULAR | Status: DC | PRN
  Administered 2024-09-01: 4 mg via INTRAVENOUS

## 2024-09-01 MED ORDER — DEXAMETHASONE SOD PHOSPHATE PF 10 MG/ML IJ SOLN
INTRAMUSCULAR | Status: DC | PRN
Start: 2024-09-01 — End: 2024-09-01
  Administered 2024-09-01: 5 mg via INTRAVENOUS

## 2024-09-01 MED ORDER — FENTANYL CITRATE (PF) 50 MCG/ML IJ SOSY
25.0000 ug | PREFILLED_SYRINGE | INTRAMUSCULAR | Status: DC | PRN
  Administered 2024-09-01 (×3): 50 ug via INTRAVENOUS

## 2024-09-01 MED ORDER — MAGNESIUM CITRATE PO SOLN: 1.0000 | Freq: Once | ORAL | Status: DC | PRN

## 2024-09-01 MED ORDER — PHENYLEPHRINE HCL-NACL 20-0.9 MG/250ML-% IV SOLN
INTRAVENOUS | Status: DC | PRN
Start: 2024-09-01 — End: 2024-09-01
  Administered 2024-09-01: 40 ug/min via INTRAVENOUS

## 2024-09-01 MED ORDER — VANCOMYCIN HCL 1 G IV SOLR
INTRAVENOUS | Status: DC | PRN
  Administered 2024-09-01: 1000 mg

## 2024-09-01 MED ORDER — FENTANYL CITRATE (PF) 100 MCG/2ML IJ SOLN
INTRAMUSCULAR | Status: DC | PRN
  Administered 2024-09-01 (×3): 25 ug via INTRAVENOUS
  Administered 2024-09-01: 75 ug via INTRAVENOUS
  Administered 2024-09-01 (×2): 25 ug via INTRAVENOUS

## 2024-09-01 MED ORDER — ACETAMINOPHEN 325 MG PO TABS
325.0000 mg | ORAL_TABLET | Freq: Four times a day (QID) | ORAL | Status: DC | PRN
  Administered 2024-09-02: 325 mg via ORAL
  Filled 2024-09-01: qty 1
  Filled 2024-09-01: qty 2

## 2024-09-01 MED ORDER — ALUM & MAG HYDROXIDE-SIMETH 200-200-20 MG/5ML PO SUSP: 30.0000 mL | ORAL | Status: DC | PRN

## 2024-09-01 MED ORDER — SORBITOL 70 % SOLN: 30.0000 mL | Freq: Every day | Status: DC | PRN

## 2024-09-01 MED ORDER — VASOPRESSIN 20 UNIT/ML IV SOLN
INTRAVENOUS | Status: AC
  Filled 2024-09-01: qty 1

## 2024-09-01 MED ORDER — HYDROCODONE-ACETAMINOPHEN 5-325 MG PO TABS
1.0000 | ORAL_TABLET | Freq: Three times a day (TID) | ORAL | Status: DC | PRN
  Administered 2024-09-03 (×3): 1 via ORAL
  Filled 2024-09-01 (×3): qty 1
  Filled 2024-09-01: qty 2
  Filled 2024-09-01 (×2): qty 1

## 2024-09-01 MED ORDER — DOXYCYCLINE HYCLATE 100 MG PO TABS
100.0000 mg | ORAL_TABLET | Freq: Two times a day (BID) | ORAL | Status: DC
  Administered 2024-09-02 – 2024-09-05 (×7): 100 mg via ORAL
  Filled 2024-09-01 (×8): qty 1

## 2024-09-01 MED ORDER — HYDROCODONE-ACETAMINOPHEN 7.5-325 MG PO TABS
1.0000 | ORAL_TABLET | Freq: Three times a day (TID) | ORAL | Status: DC | PRN
  Administered 2024-09-02 – 2024-09-05 (×6): 1 via ORAL
  Filled 2024-09-01 (×6): qty 1

## 2024-09-01 MED ORDER — LIDOCAINE HCL (PF) 2 % IJ SOLN
INTRAMUSCULAR | Status: DC | PRN
  Administered 2024-09-01: 40 mg via INTRADERMAL

## 2024-09-01 MED ORDER — TRANEXAMIC ACID-NACL 1000-0.7 MG/100ML-% IV SOLN
1000.0000 mg | INTRAVENOUS | Status: AC
  Administered 2024-09-01: 1000 mg via INTRAVENOUS
  Filled 2024-09-01: qty 100

## 2024-09-01 MED ORDER — SODIUM CHLORIDE 0.9 % IR SOLN
Status: DC | PRN
  Administered 2024-09-01: 1000 mL

## 2024-09-01 MED ORDER — FENTANYL CITRATE (PF) 50 MCG/ML IJ SOSY
PREFILLED_SYRINGE | INTRAMUSCULAR | Status: AC
  Filled 2024-09-01: qty 1

## 2024-09-01 MED ORDER — ONDANSETRON HCL 4 MG PO TABS: 4.0000 mg | ORAL_TABLET | Freq: Four times a day (QID) | ORAL | Status: DC | PRN

## 2024-09-01 MED ORDER — METOCLOPRAMIDE HCL 5 MG/ML IJ SOLN: 5.0000 mg | Freq: Three times a day (TID) | INTRAMUSCULAR | Status: DC | PRN

## 2024-09-01 MED ORDER — EPHEDRINE SULFATE (PRESSORS) 25 MG/5ML IV SOSY
PREFILLED_SYRINGE | INTRAVENOUS | Status: DC | PRN
  Administered 2024-09-01 (×2): 10 mg via INTRAVENOUS

## 2024-09-01 SURGICAL SUPPLY — 44 items
BAG COUNTER SPONGE SURGICOUNT (BAG) IMPLANT
BAG ZIPLOCK 12X15 (MISCELLANEOUS) ×1 IMPLANT
BLADE SAG 18X100X1.27 (BLADE) ×1 IMPLANT
CLSR STERI-STRIP ANTIMIC 1/2X4 (GAUZE/BANDAGES/DRESSINGS) IMPLANT
COVER PERINEAL POST (MISCELLANEOUS) ×1 IMPLANT
COVER SURGICAL LIGHT HANDLE (MISCELLANEOUS) ×1 IMPLANT
CUP ACET EMPHA 48 CEMENTLESS (Cup) IMPLANT
DERMABOND ADVANCED .7 DNX12 (GAUZE/BANDAGES/DRESSINGS) IMPLANT
DRAPE FOOT SWITCH (DRAPES) ×1 IMPLANT
DRAPE IMP U-DRAPE 54X76 (DRAPES) ×1 IMPLANT
DRAPE POUCH INSTRU U-SHP 10X18 (DRAPES) ×1 IMPLANT
DRAPE STERI IOBAN 125X83 (DRAPES) ×1 IMPLANT
DRAPE TOP 10253 STERILE (DRAPES) ×2 IMPLANT
DRAPE U-SHAPE 47X51 STRL (DRAPES) ×1 IMPLANT
DRSG AQUACEL AG ADV 3.5X10 (GAUZE/BANDAGES/DRESSINGS) ×1 IMPLANT
DURAPREP 26ML APPLICATOR (WOUND CARE) ×2 IMPLANT
ELECT PENCIL ROCKER SW 15FT (MISCELLANEOUS) ×1 IMPLANT
ELECT REM PT RETURN 15FT ADLT (MISCELLANEOUS) ×1 IMPLANT
GLOVE BIOGEL PI IND STRL 7.0 (GLOVE) ×1 IMPLANT
GLOVE BIOGEL PI IND STRL 7.5 (GLOVE) ×1 IMPLANT
GLOVE ECLIPSE 7.0 STRL STRAW (GLOVE) ×3 IMPLANT
GLOVE SURG SYN 7.5 PF PI (GLOVE) ×3 IMPLANT
GOWN SRG XL LVL 4 BRTHBL STRL (GOWNS) ×2 IMPLANT
HEAD M SROM 36MM PLUS 1.5 (Hips) IMPLANT
HOOD PEEL AWAY T7 (MISCELLANEOUS) ×3 IMPLANT
KIT TURNOVER KIT A (KITS) ×1 IMPLANT
LINER ACET EMPHA 36X48 AOX POL (Liner) IMPLANT
MARKER SKIN DUAL TIP RULER LAB (MISCELLANEOUS) ×1 IMPLANT
NDL SPNL 18GX3.5 QUINCKE PK (NEEDLE) ×1 IMPLANT
NEEDLE SPNL 18GX3.5 QUINCKE PK (NEEDLE) ×1 IMPLANT
PACK ANTERIOR HIP CUSTOM (KITS) ×1 IMPLANT
SCREW 6.5MMX25MM (Screw) IMPLANT
SET HNDPC FAN SPRY TIP SCT (DISPOSABLE) ×1 IMPLANT
SOLUTION PRONTOSAN WOUND 350ML (IRRIGATION / IRRIGATOR) ×1 IMPLANT
STEM FEMORAL SZ6 HIGH ACTIS (Stem) IMPLANT
SUT ETHIBOND 2 V 37 (SUTURE) ×1 IMPLANT
SUT ETHILON 2 0 PS N (SUTURE) ×1 IMPLANT
SUT MNCRL AB 3-0 PS2 18 (SUTURE) IMPLANT
SUT NYLON 3 0 (SUTURE) IMPLANT
SUT STRATAFIX PDS+ 0 24IN (SUTURE) ×1 IMPLANT
SUT VIC AB 0 CT1 36 (SUTURE) ×2 IMPLANT
SUT VIC AB 2-0 CT1 TAPERPNT 27 (SUTURE) ×2 IMPLANT
TRAY CATH INTERMITTENT SS 16FR (CATHETERS) IMPLANT
TUBE SUCTION HIGH CAP CLEAR NV (SUCTIONS) ×1 IMPLANT

## 2024-09-01 NOTE — Transfer of Care (Signed)
 Immediate Anesthesia Transfer of Care Note  Patient: Dorothy Anderson  Procedure(s) Performed: ARTHROPLASTY, HIP, TOTAL, ANTERIOR APPROACH (Right: Hip)  Patient Location: PACU  Anesthesia Type:General  Level of Consciousness: sedated  Airway & Oxygen Therapy: Patient Spontanous Breathing and Patient connected to face mask oxygen  Post-op Assessment: Report given to RN and Post -op Vital signs reviewed and stable  Post vital signs: Reviewed and stable  Last Vitals:  Vitals Value Taken Time  BP 95/74 09/01/24 11:50  Temp    Pulse 75 09/01/24 11:53  Resp 17 09/01/24 11:53  SpO2 82 % 09/01/24 11:53  Vitals shown include unfiled device data.  Last Pain:  Vitals:   09/01/24 0801  TempSrc:   PainSc: 0-No pain         Complications: No notable events documented.

## 2024-09-01 NOTE — Evaluation (Signed)
 Physical Therapy Evaluation Patient Details Name: Dorothy Anderson MRN: [id number] DOB: [birth year] Today's Date: 09/01/2024  History of Present Illness  83 yo female presents to therapy s/p R THA, anterior approach on 09/01/2024 due to stress fx, collapse of femoral head with delamination of articular cartilage, severe DJD and cavitary lesion of femoral neck. Pt PMH includes but is not limited to: B TKA, anxiety, calculus of L kidney, L ba ca, depression, DM II, GERD, HTN, and macular degeneration.  Clinical Impression    Dorothy Anderson is a 83 y.o. female POD 0 s/p R THA. Patient reports mod I with mobility at baseline. Patient is now limited by functional impairments (see PT problem list below) and requires mod A for bed mobility and CGA for transfers. Patient was able to ambulate 45 feet with RW and CGA level of assist. Patient instructed in exercise to facilitate ROM and circulation to manage edema. Patient will benefit from continued skilled PT interventions to address impairments and progress towards PLOF. Acute PT will follow to progress mobility and stair training in preparation for safe discharge home with family support and [facility] services.       If plan is discharge home, recommend the following: A little help with walking and/or transfers;A little help with bathing/dressing/bathroom;Assistance with cooking/housework;Assist for transportation;Help with stairs or ramp for entrance   Can travel by private vehicle        Equipment Recommendations None recommended by PT  Recommendations for Other Services       Functional Status Assessment Patient has had a recent decline in their functional status and demonstrates the ability to make significant improvements in function in a reasonable and predictable amount of time.     Precautions / Restrictions Precautions Precautions: Fall Restrictions Weight Bearing Restrictions Per Provider Order: Yes RLE Weight Bearing Per Provider Order: Weight  bearing as tolerated      Mobility  Bed Mobility Overal bed mobility: Needs Assistance Bed Mobility: Supine to Sit, Sit to Supine     Supine to sit: HOB elevated, Used rails, Min assist Sit to supine: Mod assist   General bed mobility comments: increased time and cues    Transfers Overall transfer level: Needs assistance Equipment used: Rolling walker (2 wheels) Transfers: Sit to/from Stand Sit to Stand: Contact guard assist           General transfer comment: min cues    Ambulation/Gait Ambulation/Gait assistance: Contact guard assist Gait Distance (Feet): 45 Feet Assistive device: Rolling walker (2 wheels) Gait Pattern/deviations: Step-to pattern, Decreased stance time - right, Antalgic, Trunk flexed Gait velocity: decreased     General Gait Details: trunk flexion with B UE support at RW min cues for safety, posture, proper distance from RW and RW management with pt able to navigate in hallway and personal room to and from the bathroom  Stairs            Wheelchair Mobility     Tilt Bed    Modified Rankin (Stroke Patients Only)       Balance Overall balance assessment: Needs assistance Sitting-balance support: Feet supported Sitting balance-Leahy Scale: Good     Standing balance support: Bilateral upper extremity supported, During functional activity, Reliant on assistive device for balance Standing balance-Leahy Scale: Fair Standing balance comment: static standing no UE support                             Pertinent Vitals/Pain Pain  Assessment Pain Assessment: 0-10 Pain Score: 5  Pain Location: R hip and LE Pain Descriptors / Indicators: Aching, Constant, Discomfort, Dull, Grimacing, Operative site guarding Pain Intervention(s): Limited activity within patient's tolerance, Monitored during session, Premedicated before session, Repositioned, Ice applied    Home Living Family/patient expects to be discharged to:: Private  residence Living Arrangements: Spouse/significant other Available Help at Discharge: Family Type of Home: House Home Access: Stairs to enter Entrance Stairs-Rails: Left Entrance Stairs-Number of Steps: 1   Home Layout: One level Home Equipment: Agricultural consultant (2 wheels);Rollator (4 wheels);Cane - single point;Toilet riser;Shower seat      Prior Function Prior Level of Function : Independent/Modified Independent             Mobility Comments: mod I with use of Rollator and occational furniture surfing for all ADLs, self care tasks and IADLs pt states her spouse helps her out       Extremity/Trunk Assessment        Lower Extremity Assessment Lower Extremity Assessment: RLE deficits/detail RLE Deficits / Details: ankle DF/PF 4/5 RLE Sensation: WNL    Cervical / Trunk Assessment Cervical / Trunk Assessment: Kyphotic  Communication   Communication Communication: Impaired Factors Affecting Communication: Hearing impaired    Cognition Arousal: Alert Behavior During Therapy: WFL for tasks assessed/performed   PT - Cognitive impairments: No apparent impairments                         Following commands: Intact       Cueing       General Comments      Exercises Total Joint Exercises Ankle Circles/Pumps: AROM, Both, 10 reps   Assessment/Plan    PT Assessment Patient needs continued PT services  PT Problem List Decreased strength;Decreased range of motion;Decreased activity tolerance;Decreased balance;Decreased mobility;Decreased coordination;Pain       PT Treatment Interventions DME instruction;Gait training;Stair training;Functional mobility training;Therapeutic activities;Therapeutic exercise;Balance training;Neuromuscular re-education;Patient/family education;Modalities    PT Goals (Current goals can be found in the Care Plan section)  Acute Rehab PT Goals Patient Stated Goal: to be able to tidy up the house PT Goal Formulation: With  patient Time For Goal Achievement: 09/15/24 Potential to Achieve Goals: Good    Frequency 7X/week     Co-evaluation               AM-PAC PT 6 Clicks Mobility  Outcome Measure Help needed turning from your back to your side while in a flat bed without using bedrails?: A Little Help needed moving from lying on your back to sitting on the side of a flat bed without using bedrails?: A Lot Help needed moving to and from a bed to a chair (including a wheelchair)?: A Little Help needed standing up from a chair using your arms (e.g., wheelchair or bedside chair)?: A Little Help needed to walk in hospital room?: A Little Help needed climbing 3-5 steps with a railing? : Total 6 Click Score: 15    End of Session Equipment Utilized During Treatment: Gait belt Activity Tolerance: Patient tolerated treatment well;No increased pain Patient left: in bed;with call bell/phone within reach;with family/visitor present Nurse Communication: Mobility status PT Visit Diagnosis: Unsteadiness on feet (R26.81);Other abnormalities of gait and mobility (R26.89);Muscle weakness (generalized) (M62.81);Difficulty in walking, not elsewhere classified (R26.2);Pain Pain - Right/Left: Right Pain - part of body: Hip;Leg    Time: 8191-8158 PT Time Calculation (min) (ACUTE ONLY): 33 min   Charges:   PT Evaluation $PT  Eval Low Complexity: 1 Low PT Treatments $Gait Training: 8-22 mins PT General Charges $$ ACUTE PT VISIT: 1 Visit         [place], PT Acute Rehab   [place] VEAR Drone 09/01/2024, 7:40 PM

## 2024-09-01 NOTE — Op Note (Addendum)
 ARTHROPLASTY, HIP, TOTAL, ANTERIOR APPROACH  Procedure Note NILAH BELCOURT   [id number]  Pre-op Diagnosis: Stress fracture of right femoral neck, impending pathologic fracture     Post-op Diagnosis: same  Operative Findings Collapse of femoral head and delamination of articular cartilage Severe DJD 1.5 cm cavitary lesion of femoral neck   Operative Procedures  1. Total hip replacement; Right hip; uncemented [id number]   Surgeon: Kay Cummins, M.D.  Assist: Ronal Morna Grave, PA-C   Anesthesia: general  Prosthesis: Depuy Acetabulum: Emphasys 48 mm Femur: Actis 6 HO Head: 36 mm size: +1.5 Liner: +4 neutral Bearing Type: metal/poly  Total Hip Arthroplasty (Anterior Approach) Op Note:  After informed consent was obtained and the operative extremity marked in the holding area, the patient was brought back to the operating room and placed supine on the HANA table. Next, the operative extremity was prepped and draped in normal sterile fashion. Surgical timeout occurred verifying patient identification, surgical site, surgical procedure and administration of antibiotics.  A 10 cm longitudinal incision was made starting from 2 fingerbreadths lateral and inferior to the ASIS towards the lateral aspect of the patella.  A Hueter approach to the hip was performed, using the interval between tensor fascia lata and sartorius.  Dissection was carried bluntly down onto the anterior hip capsule. The lateral femoral circumflex vessels were identified and coagulated. A capsulotomy was performed and the capsular flaps tagged for later repair.  The neck osteotomy was performed 1 fingerbreadth above the lesser trochanter. The femoral head was removed, the acetabular rim was cleared of soft tissue and osteophytes and attention was turned to reaming the acetabulum.  Sequential reaming was performed under fluoroscopic guidance down to the floor of the cotyloid fossa. I reamed slightly medial in order to tuck  the cup into good bone.  We reamed to a size 47 mm, and then impacted the acetabular shell. A 25 mm cancellous screw was placed to secure the shell.  A +4 neutral liner was then placed after irrigation and attention turned to the femur.  After placing the femoral hook, the leg was taken to externally rotated, extended and adducted position taking care to perform soft tissue releases to allow for adequate mobilization of the femur. Soft tissue was cleared from the shoulder of the greater trochanter and the hook elevator used to improve exposure of the proximal femur.  Lateral bone from the shoulder was rasped away for relief.  Sequential broaching performed up to a size 6.  High offset trial neck and +1.5 head were placed. The leg was brought back up to neutral and the construct reduced.  The position and sizing of components, offset and leg lengths were checked using fluoroscopy.  Based on fluoroscopic findings, we chose to go with the high offset neck and +1.5 head ball.  Stability of the construct was checked in 45 degrees of hip extension and 90 degrees of external rotation without any subluxation, shuck or impingement of prosthesis. We dislocated the prosthesis, dropped the leg back into position, removed trial components, and irrigated copiously. The final stem and head were chosen then placed, the leg brought back up, the system reduced and fluoroscopy used to verify positioning.  Antibiotic irrigation was placed in the surgical wound.   We irrigated, obtained hemostasis and closed the capsule using #2 ethibond suture.  A topical mixture of 0.25% bupivacaine  and meloxicam was placed deep to the fascia.  One gram of vancomycin powder was placed in the surgical bed.  One gram of topical tranexamic acid was injected into the joint.  The fascia was closed with #1 stratafix, the deep fat layer was closed with 0 vicryl, the subcutaneous layers closed with 2.0 Vicryl Plus and the skin closed with 2.0 nylon and  dermabond. A sterile dressing was applied. The patient was awakened in the operating room and taken to recovery in stable condition.  All sponge, needle, and instrument counts were correct at the end of the case.   Morna Grave, my PA, was a medical necessity for opening, closing, limb positioning, retracting, exposing, and overall facilitation and timely completion of the surgery.  Position: supine  Complications: see description of procedure.  Time Out: performed   Drains/Packing: none  Estimated blood loss: see anesthesia record  Returned to Recovery Room: in good condition.   Antibiotics: yes   Mechanical VTE (DVT) Prophylaxis: sequential compression devices, TED thigh-high  Chemical VTE (DVT) Prophylaxis: aspirin   Fluid Replacement: see anesthesia record  Specimens Removed: 1 to pathology   Sponge and Instrument Count Correct? yes   PACU: portable radiograph - low AP   Plan/RTC: Return in 2 weeks for suture removal. Weight Bearing/Load Lower Extremity: full  Hip precautions: none Suture Removal: 2 weeks   N. Ozell Cummins, MD [facility] 11:22 AM   Implant Name Type Inv. Item Serial No. Manufacturer Lot No. LRB No. Used Action  CUP ACET EMPHA 48 CEMENTLESS - [id number] Cup CUP ACET EMPHA 48 CEMENTLESS  [employer] [id number] Right 1 Implanted  SCREW 6.5MMX25MM - [id number] Screw SCREW 6.5MMX25MM  [employer] [id number] Right 1 Implanted  LINER ACET EMPHA 36X48 AOX POL - [id number] Liner LINER ACET EMPHA 36X48 AOX POL  [employer] [id number] Right 1 Implanted  HEAD M SROM PLUS 1.5 - [id number] Hips HEAD M SROM PLUS 1.5  [employer] [id number] Right 1 Implanted  STEM FEMORAL SZ6 HIGH ACTIS - [id number] Stem STEM FEMORAL SZ6 HIGH ACTIS  [employer] [id number] Right 1 Implanted

## 2024-09-01 NOTE — Anesthesia Procedure Notes (Signed)
 Procedure Name: Intubation Date/Time: 09/01/2024 10:11 AM  Performed by: Augusta Daved SAILOR, CRNAPre-anesthesia Checklist: Patient identified, Emergency Drugs available, Suction available and Patient being monitored Patient Re-evaluated:Patient Re-evaluated prior to induction Oxygen Delivery Method: Circle System Utilized Preoxygenation: Pre-oxygenation with 100% oxygen Induction Type: IV induction Ventilation: Mask ventilation without difficulty Laryngoscope Size: Miller and 2 Grade View: Grade I Tube type: Oral Tube size: 7.0 mm Number of attempts: 1 Airway Equipment and Method: Stylet and Oral airway Placement Confirmation: ETT inserted through vocal cords under direct vision, positive ETCO2 and breath sounds checked- equal and bilateral Secured at: 22 (at the lip) cm Tube secured with: Tape Dental Injury: Teeth and Oropharynx as per pre-operative assessment

## 2024-09-01 NOTE — Plan of Care (Signed)

## 2024-09-01 NOTE — H&P (Signed)
 PREOPERATIVE H&P  Chief Complaint: Stress fracture of right femur, sequela  HPI: Dorothy Anderson is a 83 y.o. female who presents for surgical treatment of Stress fracture of right femur, sequela.  She denies any changes in medical history.  Past Surgical History:  Procedure Laterality Date   BREAST BIOPSY Left 09/10/2023   US  LT BREAST BX W LOC DEV 1ST LESION IMG BX SPEC US  GUIDE 09/10/2023 [facility]   BREAST BIOPSY  09/27/2023   MM LT RADIOACTIVE SEED LOC MAMMO GUIDE 09/27/2023 [facility]   BREAST LUMPECTOMY WITH RADIOACTIVE SEED LOCALIZATION Left 09/29/2023   Procedure: LEFT BREAST SEED LOCALIZED LUMPECTOMY;  Surgeon: Aron Shoulders, MD;  Location: [facility];  Service: General;  Laterality: Left;   CARPAL TUNNEL RELEASE  06-17-2005   RIGHT   CATARACT EXTRACTION W/ INTRAOCULAR LENS  IMPLANT, BILATERAL     CYSTO/ LEFT RETROGRADE PYELOGRAM/ LEFT URETERAL STENT PLACEMENT  04-15-2005   URETERAL STONE   CYSTOSCOPY/RETROGRADE/URETEROSCOPY  01/27/2012   Procedure: CYSTOSCOPY/RETROGRADE/URETEROSCOPY;  Surgeon: Garnette Shack, MD;  Location: [facility];  Service: Urology;  Laterality: Right;   EXTRACORPOREAL SHOCK WAVE LITHOTRIPSY  11-09-11   RIGHT   TONSILLECTOMY AND ADENOIDECTOMY  AGE 71   TOTAL KNEE ARTHROPLASTY  09-29-2005   RIGHT   TOTAL KNEE ARTHROPLASTY Left 01/17/2013   Procedure: TOTAL KNEE ARTHROPLASTY;  Surgeon: Cordella Glendia Hutchinson, MD;  Location: [facility];  Service: Orthopedics;  Laterality: Left;  Left Total Knee Arthroplasty   Social History   Socioeconomic History   Marital status: Married    Spouse name: Not on file   Number of children: Not on file   Years of education: Not on file   Highest education level: Not on file  Occupational History   Not on file  Tobacco Use   Smoking status: Never   Smokeless tobacco: Never  Vaping Use   Vaping status: Never Used  Substance and Sexual Activity   Alcohol use: No   Drug  use: No   Sexual activity: Not Currently    Birth control/protection: Post-menopausal  Other Topics Concern   Not on file  Social History Narrative   Not on file   Social Drivers of Health   Financial Resource Strain: Not on file  Food Insecurity: Not on file  Transportation Needs: Not on file  Physical Activity: Not on file  Stress: Not on file  Social Connections: Not on file   Family History  Problem Relation Age of Onset   CVA Mother    Heart attack Father    Breast cancer Sister 75   Breast cancer Niece 76   Allergies  Allergen Reactions   Amiloride Other (See Comments)    High blood level of potassium  Other Reaction(s): high blood level of potassium   Atorvastatin Calcium  Other (See Comments)    Muscle weakness and pain   Atorvastatin Calcium      Other Reaction(s): muscle weakness and pain   Emetrol     Other reaction(s): Unknown   Macrobid [Nitrofurantoin Monohydrate Macrocrystals] Nausea And Vomiting   Propoxyphene Other (See Comments)    MADE PT STAY AWAKE   Prior to Admission medications   Medication Sig Start Date End Date Taking? Authorizing Provider  anastrozole  (ARIMIDEX ) 1 MG tablet Take 1 tablet (1 mg total) by mouth daily. 10/25/23  Yes Gudena, Vinay, MD  brimonidine (ALPHAGAN) 0.2 % ophthalmic solution Place 1 drop into both eyes in the morning and at bedtime.  Yes [provider]  Cholecalciferol  (VITAMIN D3 PO) Take 1 tablet by mouth in the morning.   Yes [provider]  CRANBERRY CONCENTRATE PO Take 4,000 mg by mouth in the morning.   Yes [provider]  docusate sodium  (COLACE) 100 MG capsule Take 100 mg by mouth every evening.   Yes [provider]  lansoprazole (PREVACID) 15 MG capsule Take 15 mg by mouth daily.   Yes [provider]  latanoprost  (XALATAN ) 0.005 % ophthalmic solution Place 1 drop into both eyes at bedtime.   Yes [provider]  metFORMIN  (GLUCOPHAGE -XR) 500 MG 24 hr  tablet Take 1,000 mg by mouth daily with supper.   Yes [provider]  metoprolol  tartrate (LOPRESSOR ) 25 MG tablet Take 25 mg by mouth 2 (two) times daily.   Yes [provider]  Multiple Vitamin (MULTIVITAMIN WITH MINERALS) TABS tablet Take 1 tablet by mouth daily.   Yes [provider]  oxybutynin  (DITROPAN ) 5 MG tablet Take 5 mg by mouth in the morning and at bedtime.   Yes [provider]  Polyethyl Glycol-Propyl Glycol (SYSTANE) 0.4-0.3 % SOLN Place 1-2 drops into both eyes 3 (three) times daily as needed (dry/irritated eyes.).   Yes [provider]  sertraline (ZOLOFT) 50 MG tablet Take 50 mg by mouth daily. 10/18/23  Yes [provider]  simvastatin (ZOCOR) 10 MG tablet Take 10 mg by mouth 3 (three) times a week. 10/24/21  Yes [provider]  trimethoprim  (TRIMPEX ) 100 MG tablet Take 100 mg by mouth at bedtime.   Yes [provider]  aspirin (ASPIRIN CHILDRENS) 81 MG chewable tablet Chew 1 tablet (81 mg total) by mouth 2 (two) times daily. To be taken after surgery to prevent blood clots Patient not taking: Reported on 08/31/2024 08/31/24 10/12/24  Jule Ronal CROME, PA-C  docusate sodium  (COLACE) 100 MG capsule Take 1 capsule (100 mg total) by mouth daily as needed. 08/31/24 08/31/25  Jule Ronal CROME, PA-C  doxycycline (VIBRA-TABS) 100 MG tablet Take 1 tablet (100 mg total) by mouth 2 (two) times daily. To be taken after surgery 08/31/24   Jule Ronal CROME, PA-C  HYDROcodone -acetaminophen  (NORCO/VICODIN) 5-325 MG tablet Take 1 tablet by mouth 3 (three) times daily as needed for moderate pain (pain score 4-6). To be taken after surgery 08/31/24   Jule Ronal CROME, PA-C  methocarbamol  (ROBAXIN ) 500 MG tablet Take 1 tablet (500 mg total) by mouth 2 (two) times daily as needed. Patient not taking: Reported on 08/31/2024 08/31/24   Jule Ronal CROME, PA-C  ondansetron  (ZOFRAN ) 4 MG tablet Take 1 tablet (4 mg total) by mouth  every 8 (eight) hours as needed for nausea or vomiting. Patient not taking: Reported on 08/31/2024 08/31/24   Jule Ronal CROME, PA-C     Positive ROS: All other systems have been reviewed and were otherwise negative with the exception of those mentioned in the HPI and as above.  Physical Exam: General: Alert, no acute distress Cardiovascular: No pedal edema Respiratory: No cyanosis, no use of accessory musculature GI: abdomen soft Skin: No lesions in the area of chief complaint Neurologic: Sensation intact distally Psychiatric: Patient is competent for consent with normal mood and affect Lymphatic: no lymphedema  MUSCULOSKELETAL: exam stable  Assessment: Stress fracture of right femur, sequela  Plan: Plan for Procedure(s): ARTHROPLASTY, HIP, TOTAL, ANTERIOR APPROACH  The risks benefits and alternatives were discussed with the patient including but not limited to the risks of nonoperative treatment, versus  surgical intervention including infection, bleeding, nerve injury,  blood clots, cardiopulmonary complications, morbidity, mortality, among others, and they were willing to proceed.   Ozell Cummins, MD 09/01/2024 7:30 AM

## 2024-09-01 NOTE — Anesthesia Postprocedure Evaluation (Signed)
 Anesthesia Post Note  Patient: Dorothy Anderson  Procedure(s) Performed: ARTHROPLASTY, HIP, TOTAL, ANTERIOR APPROACH (Right: Hip)     Patient location during evaluation: PACU Anesthesia Type: General Level of consciousness: awake and alert Pain management: pain level controlled Vital Signs Assessment: post-procedure vital signs reviewed and stable Respiratory status: spontaneous breathing, nonlabored ventilation, respiratory function stable and patient connected to nasal cannula oxygen Cardiovascular status: blood pressure returned to baseline and stable Postop Assessment: no apparent nausea or vomiting Anesthetic complications: no   No notable events documented.  Last Vitals:  Vitals:   09/01/24 1400 09/01/24 1500  BP: (!) 113/49 (!) 126/51  Pulse: (!) 54 68  Resp: 17 15  Temp:    SpO2: 100% 93%    Last Pain:  Vitals:   09/01/24 1500  TempSrc:   PainSc: Asleep                 Cordella P Maninder Deboer

## 2024-09-01 NOTE — Anesthesia Preprocedure Evaluation (Addendum)
 Anesthesia Evaluation  Patient identified by MRN, date of birth, ID band Patient awake    Reviewed: Allergy & Precautions, NPO status , Patient's Chart, lab work & pertinent test results  Airway Mallampati: II  TM Distance: >3 FB Neck ROM: Full    Dental no notable dental hx.    Pulmonary neg pulmonary ROS   Pulmonary exam normal        Cardiovascular hypertension,  Rhythm:Regular Rate:Normal     Neuro/Psych   Anxiety Depression    negative neurological ROS     GI/Hepatic Neg liver ROS,GERD  Medicated,,  Endo/Other  diabetes, Type 2, Oral Hypoglycemic Agents    Renal/GU   negative genitourinary   Musculoskeletal  (+) Arthritis , Osteoarthritis,    Abdominal Normal abdominal exam  (+)   Peds  Hematology Lab Results      Component                Value               Date                      WBC                      9.5                 08/25/2024                HGB                      12.3                08/25/2024                HCT                      39.0                08/25/2024                MCV                      102.9 (H)           08/25/2024                PLT                      475 (H)             08/25/2024             Lab Results      Component                Value               Date                      NA                       138                 08/25/2024                K  4.6                 08/25/2024                CO2                      24                  08/25/2024                GLUCOSE                  126 (H)             08/25/2024                BUN                      18                  08/25/2024                CREATININE               0.95                08/25/2024                CALCIUM                   10.4 (H)            08/25/2024                GFRNONAA                 59 (L)              08/25/2024              Anesthesia Other Findings    Reproductive/Obstetrics                              Anesthesia Physical Anesthesia Plan  ASA: 2  Anesthesia Plan: General   Post-op Pain Management:    Induction: Intravenous  PONV Risk Score and Plan: 2 and Ondansetron , Dexamethasone , Propofol  infusion and Treatment may vary due to age or medical condition  Airway Management Planned: Mask and Oral ETT  Additional Equipment: None  Intra-op Plan:   Post-operative Plan: Extubation in OR  Informed Consent: I have reviewed the patients History and Physical, chart, labs and discussed the procedure including the risks, benefits and alternatives for the proposed anesthesia with the patient or authorized representative who has indicated his/her understanding and acceptance.     Dental advisory given  Plan Discussed with: CRNA  Anesthesia Plan Comments:          Anesthesia Quick Evaluation

## 2024-09-01 NOTE — Discharge Instructions (Signed)

## 2024-09-02 ENCOUNTER — Other Ambulatory Visit: Payer: Self-pay

## 2024-09-02 DIAGNOSIS — Z853 Personal history of malignant neoplasm of breast: Secondary | ICD-10-CM | POA: Diagnosis not present

## 2024-09-02 DIAGNOSIS — N3289 Other specified disorders of bladder: Secondary | ICD-10-CM | POA: Diagnosis not present

## 2024-09-02 DIAGNOSIS — Z471 Aftercare following joint replacement surgery: Secondary | ICD-10-CM | POA: Diagnosis not present

## 2024-09-02 DIAGNOSIS — Z87440 Personal history of urinary (tract) infections: Secondary | ICD-10-CM | POA: Diagnosis not present

## 2024-09-02 DIAGNOSIS — E119 Type 2 diabetes mellitus without complications: Secondary | ICD-10-CM | POA: Diagnosis present

## 2024-09-02 DIAGNOSIS — Z8249 Family history of ischemic heart disease and other diseases of the circulatory system: Secondary | ICD-10-CM | POA: Diagnosis not present

## 2024-09-02 DIAGNOSIS — Z881 Allergy status to other antibiotic agents status: Secondary | ICD-10-CM | POA: Diagnosis not present

## 2024-09-02 DIAGNOSIS — R197 Diarrhea, unspecified: Secondary | ICD-10-CM | POA: Diagnosis not present

## 2024-09-02 DIAGNOSIS — M170 Bilateral primary osteoarthritis of knee: Secondary | ICD-10-CM | POA: Diagnosis present

## 2024-09-02 DIAGNOSIS — H04123 Dry eye syndrome of bilateral lacrimal glands: Secondary | ICD-10-CM | POA: Diagnosis not present

## 2024-09-02 DIAGNOSIS — F419 Anxiety disorder, unspecified: Secondary | ICD-10-CM | POA: Diagnosis present

## 2024-09-02 DIAGNOSIS — Z96641 Presence of right artificial hip joint: Secondary | ICD-10-CM | POA: Diagnosis not present

## 2024-09-02 DIAGNOSIS — I10 Essential (primary) hypertension: Secondary | ICD-10-CM | POA: Diagnosis present

## 2024-09-02 DIAGNOSIS — C50412 Malignant neoplasm of upper-outer quadrant of left female breast: Secondary | ICD-10-CM | POA: Diagnosis not present

## 2024-09-02 DIAGNOSIS — Z79811 Long term (current) use of aromatase inhibitors: Secondary | ICD-10-CM | POA: Diagnosis not present

## 2024-09-02 DIAGNOSIS — K5900 Constipation, unspecified: Secondary | ICD-10-CM | POA: Diagnosis not present

## 2024-09-02 DIAGNOSIS — F32A Depression, unspecified: Secondary | ICD-10-CM | POA: Diagnosis present

## 2024-09-02 DIAGNOSIS — Z9841 Cataract extraction status, right eye: Secondary | ICD-10-CM | POA: Diagnosis not present

## 2024-09-02 DIAGNOSIS — D62 Acute posthemorrhagic anemia: Secondary | ICD-10-CM | POA: Diagnosis not present

## 2024-09-02 DIAGNOSIS — Z96652 Presence of left artificial knee joint: Secondary | ICD-10-CM | POA: Diagnosis present

## 2024-09-02 DIAGNOSIS — R112 Nausea with vomiting, unspecified: Secondary | ICD-10-CM | POA: Diagnosis not present

## 2024-09-02 DIAGNOSIS — Z9842 Cataract extraction status, left eye: Secondary | ICD-10-CM | POA: Diagnosis not present

## 2024-09-02 DIAGNOSIS — Z7984 Long term (current) use of oral hypoglycemic drugs: Secondary | ICD-10-CM | POA: Diagnosis not present

## 2024-09-02 DIAGNOSIS — I959 Hypotension, unspecified: Secondary | ICD-10-CM | POA: Diagnosis not present

## 2024-09-02 DIAGNOSIS — Z7401 Bed confinement status: Secondary | ICD-10-CM | POA: Diagnosis not present

## 2024-09-02 DIAGNOSIS — H409 Unspecified glaucoma: Secondary | ICD-10-CM | POA: Diagnosis present

## 2024-09-02 DIAGNOSIS — Z888 Allergy status to other drugs, medicaments and biological substances status: Secondary | ICD-10-CM | POA: Diagnosis not present

## 2024-09-02 DIAGNOSIS — M25551 Pain in right hip: Secondary | ICD-10-CM | POA: Diagnosis not present

## 2024-09-02 DIAGNOSIS — Z803 Family history of malignant neoplasm of breast: Secondary | ICD-10-CM | POA: Diagnosis not present

## 2024-09-02 DIAGNOSIS — Z961 Presence of intraocular lens: Secondary | ICD-10-CM | POA: Diagnosis present

## 2024-09-02 DIAGNOSIS — H9193 Unspecified hearing loss, bilateral: Secondary | ICD-10-CM | POA: Diagnosis not present

## 2024-09-02 DIAGNOSIS — Z79899 Other long term (current) drug therapy: Secondary | ICD-10-CM | POA: Diagnosis not present

## 2024-09-02 DIAGNOSIS — Z87442 Personal history of urinary calculi: Secondary | ICD-10-CM | POA: Diagnosis not present

## 2024-09-02 DIAGNOSIS — M84351D Stress fracture, right femur, subsequent encounter for fracture with routine healing: Secondary | ICD-10-CM | POA: Diagnosis not present

## 2024-09-02 DIAGNOSIS — M62838 Other muscle spasm: Secondary | ICD-10-CM | POA: Diagnosis not present

## 2024-09-02 DIAGNOSIS — K219 Gastro-esophageal reflux disease without esophagitis: Secondary | ICD-10-CM | POA: Diagnosis present

## 2024-09-02 DIAGNOSIS — M84351A Stress fracture, right femur, initial encounter for fracture: Secondary | ICD-10-CM | POA: Diagnosis present

## 2024-09-02 LAB — GLUCOSE, CAPILLARY
Glucose-Capillary: 185 mg/dL — ABNORMAL HIGH (ref 70–99)
Glucose-Capillary: 220 mg/dL — ABNORMAL HIGH (ref 70–99)
Glucose-Capillary: 258 mg/dL — ABNORMAL HIGH (ref 70–99)
Glucose-Capillary: 154 mg/dL — ABNORMAL HIGH (ref 70–99)

## 2024-09-02 NOTE — Progress Notes (Signed)
 Physical Therapy Treatment Patient Details Name: Dorothy Anderson MRN: [id number] DOB: 25-Sep-1941 Today's Date: 09/02/2024   History of Present Illness 83 yo female presents to therapy s/p R THA, anterior approach on 09/01/2024 due to stress fx, collapse of femoral head with delamination of articular cartilage, severe DJD and cavitary lesion of femoral neck. Pt PMH includes but is not limited to: B TKA, anxiety, calculus of L kidney, L ba ca, depression, DM II, GERD, HTN, and macular degeneration.    PT Comments  POD # 1 pm session Pt in bed, pre medicated and Spouse and Daughter in room visiting.  Pt stated she has amb to the bathroom 2 or 3 times today.  Assisted OOB to amb in hallway.  General Gait Details: tolerated an increased distance with walker.  VC's on upright posture and proper walker to self distance esp with turns. Reports pain 5/10 (premedicated)  Then returned to room to perform some TE's following HEP handout.  Instructed on proper tech, freq as well as use of ICE.   Pt plans to D/C to SNF for ST Rehab prior to returning home. Dr Jerri agrees.  Family would like [facility].     If plan is discharge home, recommend the following: A little help with walking and/or transfers;A little help with bathing/dressing/bathroom;Assistance with cooking/housework;Assist for transportation;Help with stairs or ramp for entrance   Can travel by private vehicle        Equipment Recommendations  None recommended by PT    Recommendations for Other Services       Precautions / Restrictions Precautions Precautions: Fall Restrictions Weight Bearing Restrictions Per Provider Order: No RLE Weight Bearing Per Provider Order: Weight bearing as tolerated     Mobility  Bed Mobility Overal bed mobility: Needs Assistance Bed Mobility: Supine to Sit, Sit to Supine     Supine to sit: Min assist, Mod assist Sit to supine: Mod assist   General bed mobility comments: Required increased  time and effort to complete transfer from supine to EOB with increased support for R LE and use of bed pad to complete swival/scooting.    Transfers Overall transfer level: Needs assistance Equipment used: Rolling walker (2 wheels) Transfers: Sit to/from Stand Sit to Stand: Contact guard assist, Min assist           General transfer comment: Required increased time to rise from bed as well as VC's on proper hand placement to push up.  Slight posterior LOB during hand tranfer onto walker.  Unsteady esp with turns.  VC's on proper walker to self distance with turns as well as turn completiuon prior to sit.    Ambulation/Gait Ambulation/Gait assistance: Contact guard assist, Min assist Gait Distance (Feet): 47 Feet Assistive device: Rolling walker (2 wheels) Gait Pattern/deviations: Step-to pattern, Decreased stance time - right, Antalgic, Trunk flexed Gait velocity: decreased     General Gait Details: tolerated an increased distance with walker.  VC's on upright posture and proper walker to self distance esp with turns. Reports pain 5/10 (premedicated)   Stairs             Wheelchair Mobility     Tilt Bed    Modified Rankin (Stroke Patients Only)       Balance  Communication Communication Communication: Impaired Factors Affecting Communication: Hearing impaired  Cognition Arousal: Alert Behavior During Therapy: WFL for tasks assessed/performed   PT - Cognitive impairments: No apparent impairments                       PT - Cognition Comments: AxO x 3 pleasant Lady with slight Post OP intermittent confusion to time/day but following all commands.  HOH and low vision.  Lives home with Spouse who amb with a Tri walker.  Pt and family plan for D/C to SNF for ST Rehab ([facility]) Following commands: Intact      Cueing Cueing Techniques: Verbal cues  Exercises  Total Hip  Replacement TE's following HEP Handout 10 reps ankle pumps 05 reps knee presses 05 reps heel slides 05 reps SAQ's 05 reps ABD Instructed how to use a belt loop to assist  Followed by ICE     General Comments        Pertinent Vitals/Pain Pain Assessment Pain Assessment: 0-10 Pain Score: 5  Faces Pain Scale: Hurts a little bit Pain Location: R hip with activity Pain Descriptors / Indicators: Discomfort, Operative site guarding, Tender Pain Intervention(s): Monitored during session, Premedicated before session, Repositioned, Ice applied    Home Living                          Prior Function            PT Goals (current goals can now be found in the care plan section) Progress towards PT goals: Progressing toward goals    Frequency    7X/week      PT Plan      Co-evaluation              AM-PAC PT 6 Clicks Mobility   Outcome Measure  Help needed turning from your back to your side while in a flat bed without using bedrails?: A Lot Help needed moving from lying on your back to sitting on the side of a flat bed without using bedrails?: A Lot Help needed moving to and from a bed to a chair (including a wheelchair)?: A Lot Help needed standing up from a chair using your arms (e.g., wheelchair or bedside chair)?: A Lot Help needed to walk in hospital room?: A Lot Help needed climbing 3-5 steps with a railing? : Total 6 Click Score: 11    End of Session Equipment Utilized During Treatment: Gait belt Activity Tolerance: Patient tolerated treatment well;No increased pain Patient left: in bed;with call bell/phone within reach;with family/visitor present;with bed alarm set Nurse Communication: Mobility status PT Visit Diagnosis: Unsteadiness on feet (R26.81);Other abnormalities of gait and mobility (R26.89);Muscle weakness (generalized) (M62.81);Difficulty in walking, not elsewhere classified (R26.2);Pain Pain - Right/Left: Right Pain - part of body:  Hip;Leg     Time: 8390-8365 PT Time Calculation (min) (ACUTE ONLY): 25 min  Charges:    $Gait Training: 8-22 mins $Therapeutic Exercise: 8-22 mins PT General Charges $$ ACUTE PT VISIT: 1 Visit                     Katheryn Leap  PTA Acute  Rehabilitation Services Office M-F          [phone]

## 2024-09-02 NOTE — Progress Notes (Signed)
Physical Therapy Treatment Patient Details Name: RICKIYA PICARIELLO MRN: [id number] DOB: 1941-06-25 Today's Date: 09/02/2024   History of Present Illness 83 yo female presents to therapy s/p R THA, anterior approach on 09/01/2024 due to stress fx, collapse of femoral head with delamination of articular cartilage, severe DJD and cavitary lesion of femoral neck. Pt PMH includes but is not limited to: B TKA, anxiety, calculus of L kidney, L ba ca, depression, DM II, GERD, HTN, and macular degeneration.    PT Comments  POD # 1 am session AxO x 3 pleasant Lady with slight Post OP intermittent confusion to time/day but following all commands.  HOH and low vision.  Lives home with Spouse who amb with a Tri walker.  Pt and family plan for D/C to SNF for ST Rehab ([facility])  Assisted OOB to amb required increased effort and time.  General bed mobility comments: Required increased time and effort to complete transfer from supine to EOB with increased support for R LE and use of bed pad to complete swival/scooting.  General transfer comment: Required increased time to rise from bed as well as VC's on proper hand placement to push up.  Slight posterior LOB during hand tranfer onto walker.  Unsteady esp with turns.  VC's on proper walker to self distance with turns as well as turn completiuon prior to sit. General Gait Details: trunk flexion with B UE support at RW min cues for safety, posture, proper distance from RW and RW management with pt able to navigate in hallway a short distance.  Performed a few THR TE's while in recliner followed by ICE.     If plan is discharge home, recommend the following: A little help with walking and/or transfers;A little help with bathing/dressing/bathroom;Assistance with cooking/housework;Assist for transportation;Help with stairs or ramp for entrance   Can travel by private vehicle        Equipment Recommendations  None recommended by PT    Recommendations  for Other Services       Precautions / Restrictions Precautions Precautions: Fall Restrictions Weight Bearing Restrictions Per Provider Order: No RLE Weight Bearing Per Provider Order: Weight bearing as tolerated     Mobility  Bed Mobility Overal bed mobility: Needs Assistance Bed Mobility: Supine to Sit     Supine to sit: Min assist, Mod assist     General bed mobility comments: Required increased time and effort to complete transfer from supine to EOB with increased support for R LE and use of bed pad to complete swival/scooting.    Transfers Overall transfer level: Needs assistance Equipment used: Rolling walker (2 wheels) Transfers: Sit to/from Stand Sit to Stand: Contact guard assist, Min assist           General transfer comment: Required increased time to rise from bed as well as VC's on proper hand placement to push up.  Slight posterior LOB during hand tranfer onto walker.  Unsteady esp with turns.  VC's on proper walker to self distance with turns as well as turn completiuon prior to sit.    Ambulation/Gait Ambulation/Gait assistance: Contact guard assist, Min assist Gait Distance (Feet): 42 Feet Assistive device: Rolling walker (2 wheels) Gait Pattern/deviations: Step-to pattern, Decreased stance time - right, Antalgic, Trunk flexed Gait velocity: decreased     General Gait Details: trunk flexion with B UE support at RW min cues for safety, posture, proper distance from RW and RW management with pt able to navigate in hallway a short distance  Stairs             Wheelchair Mobility     Tilt Bed    Modified Rankin (Stroke Patients Only)       Balance                                            Communication Communication Communication: Impaired Factors Affecting Communication: Hearing impaired  Cognition Arousal: Alert Behavior During Therapy: WFL for tasks assessed/performed   PT - Cognitive impairments: No  apparent impairments                       PT - Cognition Comments: AxO x 3 pleasant Lady with slight Post OP intermittent confusion to time/day but following all commands.  HOH and low vision.  Lives home with Spouse who amb with a Tri walker.  Pt and family plan for D/C to SNF for ST Rehab ([facility]) Following commands: Intact      Cueing Cueing Techniques: Verbal cues  Exercises      General Comments        Pertinent Vitals/Pain Pain Assessment Pain Assessment: Faces Faces Pain Scale: Hurts a little bit Pain Location: R hip with activity Pain Descriptors / Indicators: Discomfort, Operative site guarding, Tender Pain Intervention(s): Monitored during session, Premedicated before session, Repositioned, Ice applied    Home Living                          Prior Function            PT Goals (current goals can now be found in the care plan section) Progress towards PT goals: Progressing toward goals    Frequency    7X/week      PT Plan      Co-evaluation              AM-PAC PT 6 Clicks Mobility   Outcome Measure  Help needed turning from your back to your side while in a flat bed without using bedrails?: A Lot Help needed moving from lying on your back to sitting on the side of a flat bed without using bedrails?: A Lot Help needed moving to and from a bed to a chair (including a wheelchair)?: A Lot Help needed standing up from a chair using your arms (e.g., wheelchair or bedside chair)?: A Lot Help needed to walk in hospital room?: A Lot Help needed climbing 3-5 steps with a railing? : Total 6 Click Score: 11    End of Session Equipment Utilized During Treatment: Gait belt Activity Tolerance: Patient tolerated treatment well;No increased pain Patient left: in chair;with call bell/phone within reach;with chair alarm set;with family/visitor present Nurse Communication: Mobility status PT Visit Diagnosis: Unsteadiness on  feet (R26.81);Other abnormalities of gait and mobility (R26.89);Muscle weakness (generalized) (M62.81);Difficulty in walking, not elsewhere classified (R26.2);Pain Pain - Right/Left: Right Pain - part of body: Hip;Leg     Time: 1036-1101 PT Time Calculation (min) (ACUTE ONLY): 25 min  Charges:    $Gait Training: 8-22 mins $Therapeutic Exercise: 8-22 mins PT General Charges $$ ACUTE PT VISIT: 1 Visit                    Katheryn Leap  PTA Acute  Rehabilitation Services Office M-F  [phone]  

## 2024-09-02 NOTE — Progress Notes (Signed)
   Subjective:  Patient reports pain as mild.  No events.  Today's  total administered Morphine  Milligram Equivalents: 15  Objective:   VITALS:   Vitals:   09/02/24 0138 09/02/24 0713 09/02/24 0820 09/02/24 1507  BP: (!) 97/59 (!) 111/52 (!) 125/49 (!) 117/50  Pulse: 75 91 71 76  Resp: 17 16 16 16   Temp: 98.1 F (36.7 C) 98.1 F (36.7 C) 98.1 F (36.7 C) (!) 97.5 F (36.4 C)  TempSrc: Oral Oral Oral Oral  SpO2: 100% 98% 97% 95%  Weight:      Height:        Sensation intact distally Intact pulses distally Dorsiflexion/Plantar flexion intact Incision: dressing C/D/I and no drainage   Lab Results  Component Value Date   WBC 9.5 08/25/2024   HGB 12.3 08/25/2024   HCT 39.0 08/25/2024   MCV 102.9 (H) 08/25/2024   PLT 475 (H) 08/25/2024     Assessment/Plan:  1 Day Post-Op   - Expected postop acute blood loss anemia - Up with PT/OT - DVT ppx - SCDs, ambulation, aspirin - WBAT operative extremity - Pain control - has limited assistance from elderly husband, plan is for short term stay at [facility]  Dorothy Anderson 09/02/2024, 4:41 PM

## 2024-09-02 NOTE — Care Management Obs Status (Signed)
 MEDICARE OBSERVATION STATUS NOTIFICATION   Patient Details  Name: Dorothy Anderson MRN: [id number] Date of Birth: 10/19/1941   Medicare Observation Status Notification Given:  Yes    Tawni CHRISTELLA Eva, LCSW 09/02/2024, 12:22 PM

## 2024-09-03 LAB — GLUCOSE, CAPILLARY
Glucose-Capillary: 117 mg/dL — ABNORMAL HIGH (ref 70–99)
Glucose-Capillary: 230 mg/dL — ABNORMAL HIGH (ref 70–99)
Glucose-Capillary: 150 mg/dL — ABNORMAL HIGH (ref 70–99)
Glucose-Capillary: 173 mg/dL — ABNORMAL HIGH (ref 70–99)

## 2024-09-03 NOTE — Progress Notes (Signed)
 Physical Therapy Treatment Patient Details Name: Dorothy Anderson MRN: [id number] DOB: 1941/03/12 Today's Date: 09/03/2024   History of Present Illness 83 yo female presents to therapy s/p R THA, anterior approach on 09/01/2024 due to stress fx, collapse of femoral head with delamination of articular cartilage, severe DJD and cavitary lesion of femoral neck. Pt PMH includes but is not limited to: B TKA, anxiety, calculus of L kidney, L ba ca, depression, DM II, GERD, HTN, and macular degeneration.    PT Comments  Pt continues very cooperative and progressing steadily with mobility including increased activity tolerance and decreasing level of assist required for safe performance of most basic mobility tasks.    If plan is discharge home, recommend the following: A little help with walking and/or transfers;A little help with bathing/dressing/bathroom;Assistance with cooking/housework;Assist for transportation;Help with stairs or ramp for entrance   Can travel by private vehicle        Equipment Recommendations  None recommended by PT    Recommendations for Other Services       Precautions / Restrictions Precautions Precautions: Fall Restrictions Weight Bearing Restrictions Per Provider Order: No RLE Weight Bearing Per Provider Order: Weight bearing as tolerated     Mobility  Bed Mobility Overal bed mobility: Needs Assistance Bed Mobility: Sit to Supine, Supine to Sit     Supine to sit: Min assist Sit to supine: Min assist   General bed mobility comments: Required increased time and effort to complete transfer from supine to EOB with increased support for R LE    Transfers Overall transfer level: Needs assistance Equipment used: Rolling walker (2 wheels) Transfers: Sit to/from Stand Sit to Stand: Contact guard assist, Min assist           General transfer comment: Steady assist with cues for LE management and use of UEs to self assist     Ambulation/Gait Ambulation/Gait assistance: Contact guard assist Gait Distance (Feet): 50 Feet (twice) Assistive device: Rolling walker (2 wheels) Gait Pattern/deviations: Step-to pattern, Decreased stance time - right, Antalgic, Trunk flexed Gait velocity: decreased     General Gait Details: tolerated an increased distance with walker.  VC's on upright posture and proper walker to self distance esp with turns. Reports pain 5/10 (premedicated)   Stairs             Wheelchair Mobility     Tilt Bed    Modified Rankin (Stroke Patients Only)       Balance Overall balance assessment: Needs assistance Sitting-balance support: Feet supported Sitting balance-Leahy Scale: Good     Standing balance support: No upper extremity supported Standing balance-Leahy Scale: Fair Standing balance comment: static standing no UE support                            Communication Communication Communication: Impaired Factors Affecting Communication: Hearing impaired  Cognition Arousal: Alert Behavior During Therapy: WFL for tasks assessed/performed   PT - Cognitive impairments: No apparent impairments                       PT - Cognition Comments: AxO x 3 pleasant Lady with slight Post OP intermittent confusion to time/day but following all commands.  HOH and low vision.  Lives home with Spouse who amb with a Tri walker.  Pt and family plan for D/C to SNF for ST Rehab ([facility]) Following commands: Intact      Cueing Cueing  Techniques: Verbal cues  Exercises Total Joint Exercises Ankle Circles/Pumps: AROM, Both, 15 reps, Supine Quad Sets: AROM, Both, 10 reps, Supine Heel Slides: AAROM, Right, Supine, 20 reps Hip ABduction/ADduction: AAROM, Right, 10 reps, Supine    General Comments        Pertinent Vitals/Pain Pain Assessment Pain Assessment: 0-10 Pain Score: 4  Pain Location: R hip with activity Pain Descriptors / Indicators:  Discomfort, Operative site guarding, Tender Pain Intervention(s): Limited activity within patient's tolerance, Monitored during session, Premedicated before session, Ice applied    Home Living                          Prior Function            PT Goals (current goals can now be found in the care plan section) Acute Rehab PT Goals Patient Stated Goal: to be able to tidy up the house PT Goal Formulation: With patient Time For Goal Achievement: 09/15/24 Potential to Achieve Goals: Good Progress towards PT goals: Progressing toward goals    Frequency    7X/week      PT Plan      Co-evaluation              AM-PAC PT 6 Clicks Mobility   Outcome Measure  Help needed turning from your back to your side while in a flat bed without using bedrails?: A Lot Help needed moving from lying on your back to sitting on the side of a flat bed without using bedrails?: A Lot Help needed moving to and from a bed to a chair (including a wheelchair)?: A Lot Help needed standing up from a chair using your arms (e.g., wheelchair or bedside chair)?: A Little Help needed to walk in hospital room?: A Little Help needed climbing 3-5 steps with a railing? : A Lot 6 Click Score: 14    End of Session Equipment Utilized During Treatment: Gait belt Activity Tolerance: Patient tolerated treatment well Patient left: in bed;with call bell/phone within reach;with bed alarm set;with family/visitor present Nurse Communication: Mobility status PT Visit Diagnosis: Unsteadiness on feet (R26.81);Other abnormalities of gait and mobility (R26.89);Muscle weakness (generalized) (M62.81);Difficulty in walking, not elsewhere classified (R26.2);Pain Pain - Right/Left: Right Pain - part of body: Hip;Leg     Time: 1315-1336 PT Time Calculation (min) (ACUTE ONLY): 21 min  Charges:    $Gait Training: 8-22 mins $Therapeutic Exercise: 8-22 mins $Therapeutic Activity: 8-22 mins PT General  Charges $$ ACUTE PT VISIT: 1 Visit                     [facility] PT Acute Rehabilitation Services Office [phone]    Upton Russey 09/03/2024, 1:36 PM

## 2024-09-03 NOTE — TOC Initial Note (Signed)
 Transition of Care [facility]) - Initial/Assessment Note    Patient Details  Name: Dorothy Anderson MRN: [id number] Date of Birth: 03-10-1941  Transition of [facility]) CM/SW Contact:    Sonda Manuella Quill, RN Phone Number: 09/03/2024, 4:47 PM  Clinical Narrative:                 IP CM for d/c planning; orders also received for RW and 3-N-1; Spoke w/ pt, husband Gaither margaret Marval Mick [phone]), and family in room; pt said she lives at home, but she plans to d/c to [facility]; transportation TBD; pt verified insurance/PCP; she denied SDOH risks; pt has walker, RW, Rollator, BSC, and shower chair; DME not needed as pt has already has equipment; HHPT previously arranged by Dr Benjiman office w/ [facility]; pt does not have home oxygen; will pass on to oncoming IP CM for follow up tomorrow to clarify d/c plan, and update pt/family.  Expected Discharge Plan: Skilled Nursing Facility Barriers to Discharge: Continued Medical Work up   Patient Goals and CMS Choice Patient states their goals for this hospitalization and ongoing recovery are:: short term rehab CMS Medicare.gov Compare Post Acute Care list provided to:: Patient   [place] ownership interest in [facility].provided to:: Patient    Expected Discharge Plan and Services       Living arrangements for the past 2 months: Single Family Home                 DME Arranged: N/A DME Agency: NA                  Prior Living Arrangements/Services Living arrangements for the past 2 months: Single Family Home Lives with:: Spouse Patient language and need for interpreter reviewed:: Yes Do you feel safe going back to the place where you live?: Yes      Need for Family Participation in Patient Care: Yes (Comment) Care giver support system in place?: Yes (comment) Current home services: DME (cane, walker, RW, Rollator, and shower chair) Criminal Activity/Legal Involvement Pertinent to Current Situation/Hospitalization: No -  Comment as needed  Activities of Daily Living   ADL Screening (condition at time of admission) Independently performs ADLs?: Yes (appropriate for developmental age) Is the patient deaf or have difficulty hearing?: No Does the patient have difficulty seeing, even when wearing glasses/contacts?: No Does the patient have difficulty concentrating, remembering, or making decisions?: No  Permission Sought/Granted Permission sought to share information with : Case Manager Permission granted to share information with : Yes, Verbal Permission Granted  Share Information with NAME: Case Manager     Permission granted to share info w Relationship: Marval Mick (dtr) [phone]     Emotional Assessment Appearance:: Appears stated age Attitude/Demeanor/Rapport: Gracious Affect (typically observed): Accepting Orientation: : Oriented to Self, Oriented to Place, Oriented to  Time, Oriented to Situation Alcohol / Substance Use: Not Applicable Psych Involvement: No (comment)  Admission diagnosis:  Stress fracture of right femur, sequela [F15.648D] Status post total replacement of right hip [Z96.641] Patient Active Problem List   Diagnosis Date Noted   Status post total replacement of right hip 09/01/2024   Stress fracture of neck of right femur 07/14/2024   Primary osteoarthritis of right hip 07/14/2024   Nasal bones, closed fracture 12/26/2023   Genetic testing 11/11/2023   Malignant neoplasm of upper-outer quadrant of left breast in female, estrogen receptor positive (HCC) 10/22/2023   Family history of breast cancer    Chronic kidney disease, stage  2 (mild) 04/22/2021   Degeneration of lumbar intervertebral disc 04/22/2021   Osteopenia 04/22/2021   Abnormal findings on diagnostic imaging of other specified body structures 10/26/2020   Body mass index (BMI) 29.0-29.9, adult 10/26/2020   Esophageal reflux 10/26/2020   Overactive bladder 10/26/2020   History of colonic polyps 10/26/2020    Ptosis of both eyelids 01/28/2015   Diabetes type 2, controlled (HCC) 10/25/2014   High blood pressure 10/25/2014   Osteoarthritis of left knee 01/17/2013    Class: Diagnosis of   Osteoarthrosis involving lower leg 01/17/2013   PCP:  Elliot Charm, MD Pharmacy:   [phone] - [place], [place] - [address] AT [facility] [address] [place] [address] Phone: [phone] Fax: [phone]     Social Drivers of Health (SDOH) Social History: SDOH Screenings   Food Insecurity: No Food Insecurity (09/03/2024)  Housing: Low Risk  (09/03/2024)  Transportation Needs: No Transportation Needs (09/03/2024)  Utilities: Not At Risk (09/03/2024)  Social Connections: Moderately Isolated (09/01/2024)  Tobacco Use: Low Risk  (09/01/2024)   SDOH Interventions: Food Insecurity Interventions: Intervention Not Indicated, Inpatient TOC Housing Interventions: Intervention Not Indicated, Inpatient TOC Transportation Interventions: Intervention Not Indicated, Inpatient TOC Utilities Interventions: Intervention Not Indicated, Inpatient TOC   Readmission Risk Interventions     No data to display

## 2024-09-03 NOTE — Plan of Care (Signed)
  Problem: Education: Goal: Knowledge of General Education information will improve Description: Including pain rating scale, medication(s)/side effects and non-pharmacologic comfort measures Outcome: Progressing   Problem: Health Behavior/Discharge Planning: Goal: Ability to manage health-related needs will improve Outcome: Progressing   Problem: Clinical Measurements: Goal: Ability to maintain clinical measurements within normal limits will improve Outcome: Progressing Goal: Will remain free from infection Outcome: Progressing Goal: Diagnostic test results will improve Outcome: Progressing Goal: Respiratory complications will improve Outcome: Progressing Goal: Cardiovascular complication will be avoided Outcome: Progressing   Problem: Activity: Goal: Risk for activity intolerance will decrease Outcome: Progressing   Problem: Nutrition: Goal: Adequate nutrition will be maintained Outcome: Progressing   Problem: Coping: Goal: Level of anxiety will decrease Outcome: Progressing   Problem: Elimination: Goal: Will not experience complications related to bowel motility Outcome: Progressing Goal: Will not experience complications related to urinary retention Outcome: Progressing   Problem: Pain Managment: Goal: General experience of comfort will improve and/or be controlled Outcome: Progressing   Problem: Safety: Goal: Ability to remain free from injury will improve Outcome: Progressing   Problem: Skin Integrity: Goal: Risk for impaired skin integrity will decrease Outcome: Progressing   Problem: Education: Goal: Ability to describe self-care measures that may prevent or decrease complications (Diabetes Survival Skills Education) will improve Outcome: Progressing Goal: Individualized Educational Video(s) Outcome: Progressing   Problem: Coping: Goal: Ability to adjust to condition or change in health will improve Outcome: Progressing   Problem: Fluid  Volume: Goal: Ability to maintain a balanced intake and output will improve Outcome: Progressing   Problem: Health Behavior/Discharge Planning: Goal: Ability to identify and utilize available resources and services will improve Outcome: Progressing Goal: Ability to manage health-related needs will improve Outcome: Progressing   Problem: Metabolic: Goal: Ability to maintain appropriate glucose levels will improve Outcome: Progressing   Problem: Nutritional: Goal: Maintenance of adequate nutrition will improve Outcome: Progressing Goal: Progress toward achieving an optimal weight will improve Outcome: Progressing   Problem: Skin Integrity: Goal: Risk for impaired skin integrity will decrease Outcome: Progressing   Problem: Tissue Perfusion: Goal: Adequacy of tissue perfusion will improve Outcome: Progressing   Problem: Education: Goal: Knowledge of the prescribed therapeutic regimen will improve Outcome: Progressing Goal: Understanding of discharge needs will improve Outcome: Progressing Goal: Individualized Educational Video(s) Outcome: Progressing   Problem: Activity: Goal: Ability to avoid complications of mobility impairment will improve Outcome: Progressing Goal: Ability to tolerate increased activity will improve Outcome: Progressing   Problem: Clinical Measurements: Goal: Postoperative complications will be avoided or minimized Outcome: Progressing   Problem: Pain Management: Goal: Pain level will decrease with appropriate interventions Outcome: Progressing   Problem: Skin Integrity: Goal: Will show signs of wound healing Outcome: Progressing

## 2024-09-03 NOTE — Progress Notes (Signed)
 Patient ID: Dorothy Anderson, female   DOB: [birth year], 83 y.o.   MRN: [id number] Patient making slow progress with physical therapy.  Plan for discharge to skilled nursing.

## 2024-09-03 NOTE — Progress Notes (Signed)
 Physical Therapy Treatment Patient Details Name: Dorothy Anderson MRN: [id number] DOB: [birth year] Today's Date: 09/03/2024   History of Present Illness 83 yo female presents to therapy s/p R THA, anterior approach on 09/01/2024 due to stress fx, collapse of femoral head with delamination of articular cartilage, severe DJD and cavitary lesion of femoral neck. Pt PMH includes but is not limited to: B TKA, anxiety, calculus of L kidney, L ba ca, depression, DM II, GERD, HTN, and macular degeneration.    PT Comments  Pt very cooperative and progressing steadily with mobility including increased activity tolerance and decreasing level of assist required for safe performance of most basic mobility tasks.    If plan is discharge home, recommend the following: A little help with walking and/or transfers;A little help with bathing/dressing/bathroom;Assistance with cooking/housework;Assist for transportation;Help with stairs or ramp for entrance   Can travel by private vehicle        Equipment Recommendations  None recommended by PT    Recommendations for Other Services       Precautions / Restrictions Precautions Precautions: Fall Restrictions Weight Bearing Restrictions Per Provider Order: No RLE Weight Bearing Per Provider Order: Weight bearing as tolerated     Mobility  Bed Mobility Overal bed mobility: Needs Assistance Bed Mobility: Supine to Sit     Supine to sit: Min assist     General bed mobility comments: Required increased time and effort to complete transfer from supine to EOB with increased support for R LE    Transfers Overall transfer level: Needs assistance Equipment used: Rolling walker (2 wheels) Transfers: Sit to/from Stand Sit to Stand: Contact guard assist, Min assist           General transfer comment: Required increased time to rise from bed as well as VC's on proper hand placement to push up.  Slight posterior LOB during hand tranfer onto walker.     Ambulation/Gait Ambulation/Gait assistance: Contact guard assist, Min assist Gait Distance (Feet): 45 Feet (twice) Assistive device: Rolling walker (2 wheels) Gait Pattern/deviations: Step-to pattern, Decreased stance time - right, Antalgic, Trunk flexed Gait velocity: decreased     General Gait Details: tolerated an increased distance with walker.  VC's on upright posture and proper walker to self distance esp with turns. Reports pain 5/10 (premedicated)   Stairs             Wheelchair Mobility     Tilt Bed    Modified Rankin (Stroke Patients Only)       Balance Overall balance assessment: Needs assistance Sitting-balance support: Feet supported Sitting balance-Leahy Scale: Good     Standing balance support: No upper extremity supported Standing balance-Leahy Scale: Fair Standing balance comment: static standing no UE support                            Communication Communication Communication: Impaired Factors Affecting Communication: Hearing impaired  Cognition Arousal: Alert Behavior During Therapy: WFL for tasks assessed/performed   PT - Cognitive impairments: No apparent impairments                       PT - Cognition Comments: AxO x 3 pleasant Lady with slight Post OP intermittent confusion to time/day but following all commands.  HOH and low vision.  Lives home with Spouse who amb with a Tri walker.  Pt and family plan for D/C to SNF for ST Rehab ([facility]) Following commands:  Intact      Cueing Cueing Techniques: Verbal cues  Exercises Total Joint Exercises Ankle Circles/Pumps: AROM, Both, 15 reps, Supine Quad Sets: AROM, Both, 10 reps, Supine Heel Slides: AAROM, Right, Supine, 20 reps Hip ABduction/ADduction: AAROM, Right, 10 reps, Supine    General Comments        Pertinent Vitals/Pain Pain Assessment Pain Assessment: 0-10 Pain Score: 6  Pain Location: R hip with activity Pain Descriptors /  Indicators: Discomfort, Operative site guarding, Tender Pain Intervention(s): Limited activity within patient's tolerance, Monitored during session, Premedicated before session, Ice applied    Home Living                          Prior Function            PT Goals (current goals can now be found in the care plan section) Acute Rehab PT Goals Patient Stated Goal: to be able to tidy up the house PT Goal Formulation: With patient Time For Goal Achievement: 09/15/24 Potential to Achieve Goals: Good Progress towards PT goals: Progressing toward goals    Frequency    7X/week      PT Plan      Co-evaluation              AM-PAC PT 6 Clicks Mobility   Outcome Measure  Help needed turning from your back to your side while in a flat bed without using bedrails?: A Lot Help needed moving from lying on your back to sitting on the side of a flat bed without using bedrails?: A Lot Help needed moving to and from a bed to a chair (including a wheelchair)?: A Lot Help needed standing up from a chair using your arms (e.g., wheelchair or bedside chair)?: A Little Help needed to walk in hospital room?: A Little Help needed climbing 3-5 steps with a railing? : Total 6 Click Score: 13    End of Session Equipment Utilized During Treatment: Gait belt Activity Tolerance: Patient tolerated treatment well Patient left: in chair;with call bell/phone within reach;with chair alarm set Nurse Communication: Mobility status PT Visit Diagnosis: Unsteadiness on feet (R26.81);Other abnormalities of gait and mobility (R26.89);Muscle weakness (generalized) (M62.81);Difficulty in walking, not elsewhere classified (R26.2);Pain Pain - Right/Left: Right Pain - part of body: Hip;Leg     Time: 9082-9044 PT Time Calculation (min) (ACUTE ONLY): 38 min  Charges:    $Gait Training: 8-22 mins $Therapeutic Exercise: 8-22 mins $Therapeutic Activity: 8-22 mins PT General Charges $$ ACUTE PT  VISIT: 1 Visit                     [facility] PT Acute Rehabilitation Services Office [phone]    Eulan Heyward 09/03/2024, 10:58 AM

## 2024-09-03 NOTE — Plan of Care (Signed)

## 2024-09-04 ENCOUNTER — Encounter (HOSPITAL_COMMUNITY): Payer: Self-pay | Admitting: Orthopaedic Surgery

## 2024-09-04 ENCOUNTER — Encounter: Payer: Self-pay | Admitting: Orthopaedic Surgery

## 2024-09-04 LAB — GLUCOSE, CAPILLARY
Comment 1: 10490278
Glucose-Capillary: 103 mg/dL — ABNORMAL HIGH (ref 70–99)
Glucose-Capillary: 151 mg/dL — ABNORMAL HIGH (ref 70–99)
Glucose-Capillary: 120 mg/dL — ABNORMAL HIGH (ref 70–99)
Glucose-Capillary: 161 mg/dL — ABNORMAL HIGH (ref 70–99)
Glucose-Capillary: 177 mg/dL — ABNORMAL HIGH (ref 70–99)

## 2024-09-04 MED ORDER — ORAL CARE MOUTH RINSE: 15.0000 mL | OROMUCOSAL | Status: DC | PRN

## 2024-09-04 NOTE — Progress Notes (Signed)
 Physical Therapy Treatment Patient Details Name: Dorothy Anderson MRN: [id number] DOB: Jul 03, 1941 Today's Date: 09/04/2024   History of Present Illness 83 yo female presents to therapy s/p R THA, anterior approach on 09/01/2024 due to stress fx, collapse of femoral head with delamination of articular cartilage, severe DJD and cavitary lesion of femoral neck. Pt PMH includes but is not limited to: B TKA, anxiety, calculus of L kidney, L ba ca, depression, DM II, GERD, HTN, and macular degeneration.    PT Comments  POD # 3 am session AxO x 3 pleasant Lady with slight Post OP intermittent confusion to time/day but following all commands.  HOH and low vision.  Lives home with Spouse who amb with a Tri walker.  Pt and family plan for D/C to SNF for ST Rehab ([facility]) Dr Jerri agrees Assisted OOB to amb in hallway then bathroom required increased time and effort.  General Gait Details: tolerated an increased distance with walker.  VC's on upright posture and proper walker to self distance esp with turns. Reports pain 5/10 (premedicated)  Mod c/o fatigue after.  Then returned to room to perform some TE's following HEP handout.  Instructed on proper tech, freq as well as use of ICE.   Daughter and Spouse present.    If plan is discharge home, recommend the following: A little help with walking and/or transfers;A little help with bathing/dressing/bathroom;Assistance with cooking/housework;Assist for transportation;Help with stairs or ramp for entrance   Can travel by private vehicle        Equipment Recommendations  None recommended by PT    Recommendations for Other Services       Precautions / Restrictions       Mobility  Bed Mobility Overal bed mobility: Needs Assistance Bed Mobility: Sit to Supine, Supine to Sit     Supine to sit: Min assist Sit to supine: Min assist, Mod assist   General bed mobility comments: Required increased time and effort to complete transfer from  supine to EOB with increased support for R LE esp back to bed    Transfers Overall transfer level: Needs assistance Equipment used: Rolling walker (2 wheels) Transfers: Sit to/from Stand Sit to Stand: Contact guard assist, Min assist           General transfer comment: Steady assist with cues for LE management and use of UEs to self assist.  Also assisted with a toilet transfer requiring assist for standing balance during peri care.    Ambulation/Gait Ambulation/Gait assistance: Contact guard assist, Min assist Gait Distance (Feet): 47 Feet Assistive device: Rolling walker (2 wheels) Gait Pattern/deviations: Step-to pattern, Decreased stance time - right, Antalgic, Trunk flexed Gait velocity: decreased     General Gait Details: tolerated an increased distance with walker.  VC's on upright posture and proper walker to self distance esp with turns. Reports pain 5/10 (premedicated)  Mod c/o fatigue after.   Stairs             Wheelchair Mobility     Tilt Bed    Modified Rankin (Stroke Patients Only)       Balance                                            Communication Communication Communication: Impaired Factors Affecting Communication: Hearing impaired  Cognition Arousal: Alert     PT - Cognitive  impairments: No apparent impairments                       PT - Cognition Comments: AxO x 3 pleasant Lady with slight Post OP intermittent confusion to time/day but following all commands.  HOH and low vision.  Lives home with Spouse who amb with a Tri walker.  Pt and family plan for D/C to SNF for ST Rehab ([facility]) Following commands: Intact      Cueing Cueing Techniques: Verbal cues  Exercises  Total Hip Replacement TE's following HEP Handout 10 reps ankle pumps 05 reps knee presses 05 reps heel slides 05 reps SAQ's 05 reps ABD Instructed how to use a belt loop to assist  Followed by ICE     General  Comments        Pertinent Vitals/Pain      Home Living                          Prior Function            PT Goals (current goals can now be found in the care plan section) Progress towards PT goals: Progressing toward goals    Frequency    7X/week      PT Plan      Co-evaluation              AM-PAC PT 6 Clicks Mobility   Outcome Measure  Help needed turning from your back to your side while in a flat bed without using bedrails?: A Lot Help needed moving from lying on your back to sitting on the side of a flat bed without using bedrails?: A Lot Help needed moving to and from a bed to a chair (including a wheelchair)?: A Lot Help needed standing up from a chair using your arms (e.g., wheelchair or bedside chair)?: A Little Help needed to walk in hospital room?: A Little Help needed climbing 3-5 steps with a railing? : A Lot 6 Click Score: 14    End of Session Equipment Utilized During Treatment: Gait belt Activity Tolerance: Patient tolerated treatment well;Patient limited by fatigue Patient left: in bed;with call bell/phone within reach;with bed alarm set;with family/visitor present Nurse Communication: Mobility status PT Visit Diagnosis: Unsteadiness on feet (R26.81);Other abnormalities of gait and mobility (R26.89);Muscle weakness (generalized) (M62.81);Difficulty in walking, not elsewhere classified (R26.2);Pain Pain - Right/Left: Right Pain - part of body: Hip;Leg     Time: 8693-8667 PT Time Calculation (min) (ACUTE ONLY): 26 min  Charges:    $Gait Training: 8-22 mins $Therapeutic Activity: 8-22 mins PT General Charges $$ ACUTE PT VISIT: 1 Visit                    Katheryn Leap  PTA Acute  Rehabilitation Services Office M-F          [phone]

## 2024-09-04 NOTE — Plan of Care (Signed)
  Problem: Health Behavior/Discharge Planning: Goal: Ability to manage health-related needs will improve Outcome: Progressing   Problem: Clinical Measurements: Goal: Ability to maintain clinical measurements within normal limits will improve Outcome: Progressing Goal: Will remain free from infection Outcome: Progressing Goal: Diagnostic test results will improve Outcome: Progressing Goal: Respiratory complications will improve Outcome: Progressing Goal: Cardiovascular complication will be avoided Outcome: Progressing   Problem: Activity: Goal: Risk for activity intolerance will decrease Outcome: Progressing   Problem: Coping: Goal: Level of anxiety will decrease Outcome: Progressing   Problem: Elimination: Goal: Will not experience complications related to bowel motility Outcome: Progressing   Problem: Pain Managment: Goal: General experience of comfort will improve and/or be controlled Outcome: Progressing   Problem: Safety: Goal: Ability to remain free from injury will improve Outcome: Progressing   Problem: Skin Integrity: Goal: Risk for impaired skin integrity will decrease Outcome: Progressing   Problem: Education: Goal: Ability to describe self-care measures that may prevent or decrease complications (Diabetes Survival Skills Education) will improve Outcome: Progressing   Problem: Coping: Goal: Ability to adjust to condition or change in health will improve Outcome: Progressing   Problem: Fluid Volume: Goal: Ability to maintain a balanced intake and output will improve Outcome: Progressing   Problem: Health Behavior/Discharge Planning: Goal: Ability to identify and utilize available resources and services will improve Outcome: Progressing Goal: Ability to manage health-related needs will improve Outcome: Progressing   Problem: Metabolic: Goal: Ability to maintain appropriate glucose levels will improve Outcome: Progressing   Problem:  Nutritional: Goal: Maintenance of adequate nutrition will improve Outcome: Progressing Goal: Progress toward achieving an optimal weight will improve Outcome: Progressing   Problem: Skin Integrity: Goal: Risk for impaired skin integrity will decrease Outcome: Progressing   Problem: Tissue Perfusion: Goal: Adequacy of tissue perfusion will improve Outcome: Progressing   Problem: Education: Goal: Knowledge of the prescribed therapeutic regimen will improve Outcome: Progressing Goal: Understanding of discharge needs will improve Outcome: Progressing Goal: Individualized Educational Video(s) Outcome: Progressing   Problem: Activity: Goal: Ability to avoid complications of mobility impairment will improve Outcome: Progressing Goal: Ability to tolerate increased activity will improve Outcome: Progressing   Problem: Clinical Measurements: Goal: Postoperative complications will be avoided or minimized Outcome: Progressing   Problem: Pain Management: Goal: Pain level will decrease with appropriate interventions Outcome: Progressing   Problem: Skin Integrity: Goal: Will show signs of wound healing Outcome: Progressing

## 2024-09-04 NOTE — TOC Progression Note (Signed)
 Transition of Care [facility]) - Progression Note    Patient Details  Name: Dorothy Anderson MRN: [id number] Date of Birth: [birth year]  [facility]) CM/SW Contact  Alfonse JONELLE Rex, RN Phone Number: 09/04/2024, 3:01 PM  Clinical Narrative:   DC order to [facility] (patient's preference). Call to [facility], admit coordinator w/[facility], notified of short term rehab request ,  no answer, vm left with NCM name and phone number informing  of  MD referral for short term rehab, FL2 updated. TOC will continue to follow.     Expected Discharge Plan: Skilled Nursing Facility Barriers to Discharge: Continued Medical Work up               Expected Discharge Plan and Services       Living arrangements for the past 2 months: Single Family Home Expected Discharge Date: 09/04/24               DME Arranged: N/A DME Agency: NA                   Social Drivers of Health (SDOH) Interventions SDOH Screenings   Food Insecurity: No Food Insecurity (09/03/2024)  Housing: Low Risk  (09/03/2024)  Transportation Needs: No Transportation Needs (09/03/2024)  Utilities: Not At Risk (09/03/2024)  Social Connections: Moderately Isolated (09/01/2024)  Tobacco Use: Low Risk  (09/01/2024)    Readmission Risk Interventions    09/04/2024    3:00 PM  Readmission Risk Prevention Plan  Post Dischage Appt Complete  Medication Screening Complete  Transportation Screening Complete

## 2024-09-04 NOTE — Progress Notes (Signed)
 Physical Therapy Treatment Patient Details Name: Dorothy Anderson MRN: [id number] DOB: 04/05/1941 Today's Date: 09/04/2024   History of Present Illness 83 yo female presents to therapy s/p R THA, anterior approach on 09/01/2024 due to stress fx, collapse of femoral head with delamination of articular cartilage, severe DJD and cavitary lesion of femoral neck. Pt PMH includes but is not limited to: B TKA, anxiety, calculus of L kidney, L ba ca, depression, DM II, GERD, HTN, and macular degeneration.    PT Comments  POD # 3 pm session Pt was amb out of the bathroom with RN.  Assisted with amb in hallway.  General bed mobility comments: Required increased time and effort to complete transfer from sitting EOB to supine.  Position to comfort and applied ICE.  Required + 2 Total Assist to scoot to [facility]. General transfer comment: required increased time and VC's on safety with turns and back steps to bed.  Slightly unsteady with turns. General bed mobility comments: Required increased time and effort to complete transfer from sitting EOB to supine.  Position to comfort and applied ICE.  Required + 2 Total Assist to scoot to [facility]. Pt progressing but slowly.  Prior Pt was IND amb with her Rollator and caring for her Spouse.  Pt will need ST Rehab at SNF to address mobility and functional decline prior to safely returning home.    If plan is discharge home, recommend the following: A little help with walking and/or transfers;A little help with bathing/dressing/bathroom;Assistance with cooking/housework;Assist for transportation;Help with stairs or ramp for entrance   Can travel by private vehicle        Equipment Recommendations  None recommended by PT    Recommendations for Other Services       Precautions / Restrictions Precautions Precautions: Fall Restrictions Weight Bearing Restrictions Per Provider Order: No RLE Weight Bearing Per Provider Order: Weight bearing as tolerated     Mobility  Bed  Mobility Overal bed mobility: Needs Assistance Bed Mobility: Sit to Supine, Supine to Sit     Supine to sit: Min assist Sit to supine: Min assist, Mod assist   General bed mobility comments: Required increased time and effort to complete transfer from sitting EOB to supine.  Position to comfort and applied ICE.  Required + 2 Total Assist to scoot to [facility].    Transfers Overall transfer level: Needs assistance Equipment used: Rolling walker (2 wheels) Transfers: Sit to/from Stand Sit to Stand: Contact guard assist, Min assist           General transfer comment: required increased time and VC's on safety with turns and back steps to bed.  Slightly unsteady with turns.    Ambulation/Gait Ambulation/Gait assistance: Contact guard assist, Min assist Gait Distance (Feet): 43 Feet Assistive device: Rolling walker (2 wheels) Gait Pattern/deviations: Step-to pattern, Decreased stance time - right, Antalgic, Trunk flexed Gait velocity: decreased     General Gait Details: tolerated a functional distance with walker.  VC's on upright posture and proper walker to self distance esp with turns. Reports pain 5/10 (premedicated)  Mod c/o fatigue after.   Stairs             Wheelchair Mobility     Tilt Bed    Modified Rankin (Stroke Patients Only)       Balance  Communication Communication Communication: Impaired Factors Affecting Communication: Hearing impaired  Cognition Arousal: Alert Behavior During Therapy: WFL for tasks assessed/performed   PT - Cognitive impairments: No apparent impairments                       PT - Cognition Comments: AxO x 3 pleasant Lady with slight Post OP intermittent confusion to time/day but following all commands.  HOH and low vision.  Lives home with Spouse who amb with a Tri walker.  Pt and family plan for D/C to SNF for ST Rehab ([facility]) Following  commands: Intact      Cueing Cueing Techniques: Verbal cues  Exercises      General Comments        Pertinent Vitals/Pain Pain Assessment Pain Assessment: 0-10 Pain Score: 5  Pain Location: R hip with activity Pain Descriptors / Indicators: Discomfort, Operative site guarding, Tender Pain Intervention(s): Monitored during session, Premedicated before session, Repositioned, Ice applied    Home Living                          Prior Function            PT Goals (current goals can now be found in the care plan section) Progress towards PT goals: Progressing toward goals    Frequency    7X/week      PT Plan      Co-evaluation              AM-PAC PT 6 Clicks Mobility   Outcome Measure  Help needed turning from your back to your side while in a flat bed without using bedrails?: A Lot Help needed moving from lying on your back to sitting on the side of a flat bed without using bedrails?: A Lot Help needed moving to and from a bed to a chair (including a wheelchair)?: A Lot Help needed standing up from a chair using your arms (e.g., wheelchair or bedside chair)?: A Little Help needed to walk in hospital room?: A Little Help needed climbing 3-5 steps with a railing? : A Lot 6 Click Score: 14    End of Session Equipment Utilized During Treatment: Gait belt Activity Tolerance: Patient tolerated treatment well;Patient limited by fatigue Patient left: in bed;with call bell/phone within reach;with bed alarm set;with family/visitor present Nurse Communication: Mobility status PT Visit Diagnosis: Unsteadiness on feet (R26.81);Other abnormalities of gait and mobility (R26.89);Muscle weakness (generalized) (M62.81);Difficulty in walking, not elsewhere classified (R26.2);Pain Pain - Right/Left: Right Pain - part of body: Hip;Leg     Time: 8461-8397 PT Time Calculation (min) (ACUTE ONLY): 24 min  Charges:    $Gait Training: 8-22 mins $Therapeutic  Activity: 8-22 mins PT General Charges $$ ACUTE PT VISIT: 1 Visit                     Katheryn Leap  PTA Acute  Rehabilitation Services Office M-F          [phone]

## 2024-09-04 NOTE — [place] FL2 (Signed)
 [place]  MEDICAID FL2 LEVEL OF CARE FORM     IDENTIFICATION  Patient Name: Dorothy Anderson Birthdate: 1941-02-14 Sex: female Admission Date (Current Location): 09/01/2024  [facility] and [place] Number:  Producer, Television/film/video and Address:  [facility],  [address], [place] [address]      Provider Number: [id number]  Attending Physician Name and Address:  Jerri Kay HERO, MD  Relative Name and Phone Number:  Mick Perry (Daughter)  [phone] [facility])    Current Level of Care: Hospital Recommended Level of Care: Skilled Nursing Facility Prior Approval Number:    Date Approved/Denied:   PASRR Number: [id number]  Discharge Plan: SNF    Current Diagnoses: Patient Active Problem List   Diagnosis Date Noted   Status post total replacement of right hip 09/01/2024   Stress fracture of neck of right femur 07/14/2024   Primary osteoarthritis of right hip 07/14/2024   Nasal bones, closed fracture 12/26/2023   Genetic testing 11/11/2023   Malignant neoplasm of upper-outer quadrant of left breast in female, estrogen receptor positive (HCC) 10/22/2023   Family history of breast cancer    Chronic kidney disease, stage 2 (mild) 04/22/2021   Degeneration of lumbar intervertebral disc 04/22/2021   Osteopenia 04/22/2021   Abnormal findings on diagnostic imaging of other specified body structures 10/26/2020   Body mass index (BMI) 29.0-29.9, adult 10/26/2020   Esophageal reflux 10/26/2020   Overactive bladder 10/26/2020   History of colonic polyps 10/26/2020   Ptosis of both eyelids 01/28/2015   Diabetes type 2, controlled (HCC) 10/25/2014   High blood pressure 10/25/2014   Osteoarthritis of left knee 01/17/2013   Osteoarthrosis involving lower leg 01/17/2013    Orientation RESPIRATION BLADDER Height & Weight     Self, Time, Situation, Place  Normal Continent Weight: 64 kg Height:  5' 1.5 (156.2 cm)  BEHAVIORAL SYMPTOMS/MOOD NEUROLOGICAL BOWEL NUTRITION STATUS       Continent Diet (carb modified)  AMBULATORY STATUS COMMUNICATION OF NEEDS Skin   Limited Assist Verbally Surgical wounds (Right THA)                       Personal Care Assistance Level of Assistance  Bathing, Feeding, Dressing Bathing Assistance: Limited assistance Feeding assistance: Limited assistance Dressing Assistance: Limited assistance     Functional Limitations Info  Sight, Hearing, Speech Sight Info: Impaired (eyeglasses) Hearing Info: Impaired (hard of hearing) Speech Info: Adequate    SPECIAL CARE FACTORS FREQUENCY  PT (By licensed PT), OT (By licensed OT)     PT Frequency: 5x/wk OT Frequency: 5x/wk            Contractures Contractures Info: Not present    Additional Factors Info  Code Status, Allergies, Psychotropic Code Status Info: Full Code Allergies Info: Amiloride, Atorvastatin Calcium , Atorvastatin Calcium , Emetrol, Macrobid (Nitrofurantoin Monohydrate Macrocrystals), Propoxyphene Psychotropic Info: N/A         Current Medications (09/04/2024):  This is the current hospital active medication list Current Facility-Administered Medications  Medication Dose Route Frequency Provider Last Rate Last Admin   0.9 %  sodium chloride  infusion   Intravenous Continuous Jerri Kay HERO, MD   Stopped at 09/02/24 0610   acetaminophen  (TYLENOL ) tablet 325-650 mg  325-650 mg Oral Q6H PRN Jerri Kay HERO, MD   325 mg at 09/02/24 2129   alum & mag hydroxide-simeth (MAALOX/MYLANTA) 200-200-20 MG/5ML suspension 30 mL  30 mL Oral Q4H PRN Jerri Kay HERO, MD       aspirin  chewable tablet 81 mg  81 mg Oral BID Jerri Kay HERO, MD   81 mg at 09/04/24 1000   diphenhydrAMINE  (BENADRYL ) 12.5 MG/5ML elixir 25 mg  25 mg Oral Q4H PRN Jerri Kay HERO, MD       docusate sodium  (COLACE) capsule 100 mg  100 mg Oral BID Jerri Kay HERO, MD   100 mg at 09/04/24 0959   doxycycline (VIBRA-TABS) tablet 100 mg  100 mg Oral BID Jerri Kay HERO, MD   100 mg at 09/04/24 9040    HYDROcodone -acetaminophen  (NORCO) 7.5-325 MG per tablet 1-2 tablet  1-2 tablet Oral Q8H PRN Jerri Kay HERO, MD   1 tablet at 09/04/24 1446   HYDROcodone -acetaminophen  (NORCO/VICODIN) 5-325 MG per tablet 1-2 tablet  1-2 tablet Oral Q8H PRN Jerri Kay HERO, MD   1 tablet at 09/03/24 2205   insulin  aspart (novoLOG ) injection 0-15 Units  0-15 Units Subcutaneous TID WC Jerri Kay HERO, MD   3 Units at 09/04/24 9040   insulin  aspart (novoLOG ) injection 0-5 Units  0-5 Units Subcutaneous QHS Jerri Kay HERO, MD   2 Units at 09/01/24 2156   magnesium  citrate solution 1 Bottle  1 Bottle Oral Once PRN Jerri Kay HERO, MD       menthol  (CEPACOL) lozenge 3 mg  1 lozenge Oral PRN Jerri Kay HERO, MD       Or   phenol (CHLORASEPTIC) mouth spray 1 spray  1 spray Mouth/Throat PRN Jerri Kay HERO, MD       methocarbamol  (ROBAXIN ) tablet 500 mg  500 mg Oral Q6H PRN Jerri Kay HERO, MD   500 mg at 09/04/24 0545   Or   methocarbamol  (ROBAXIN ) injection 500 mg  500 mg Intravenous Q6H PRN Jerri Kay HERO, MD   500 mg at 09/01/24 1215   metoCLOPramide  (REGLAN ) tablet 5-10 mg  5-10 mg Oral Q8H PRN Jerri Kay HERO, MD       Or   metoCLOPramide  (REGLAN ) injection 5-10 mg  5-10 mg Intravenous Q8H PRN Jerri Kay HERO, MD       morphine  (PF) 2 MG/ML injection 0.5-1 mg  0.5-1 mg Intravenous Q6H PRN Jerri Kay HERO, MD   0.5 mg at 09/01/24 1624   ondansetron  (ZOFRAN ) tablet 4 mg  4 mg Oral Q6H PRN Jerri Kay HERO, MD       Or   ondansetron  (ZOFRAN ) injection 4 mg  4 mg Intravenous Q6H PRN Jerri Kay HERO, MD       Oral care mouth rinse  15 mL Mouth Rinse PRN Jerri Kay HERO, MD       pantoprazole  (PROTONIX ) EC tablet 40 mg  40 mg Oral Daily Jerri Kay HERO, MD   40 mg at 09/04/24 0959   polyethylene glycol (MIRALAX / GLYCOLAX) packet 17 g  17 g Oral Daily Jerri Kay HERO, MD   17 g at 09/04/24 0959   sorbitol 70 % solution 30 mL  30 mL Oral Daily PRN Jerri Kay HERO, MD         Discharge Medications: Please see discharge summary for a list of discharge  medications.  Relevant Imaging Results:  Relevant Lab Results:   Additional Information SSN: [id number]  Alfonse JONELLE Rex, RN

## 2024-09-04 NOTE — Progress Notes (Signed)
   Subjective:  Patient reports pain as mild.    Today's  total administered Morphine  Milligram Equivalents: 7.5  Objective:   VITALS:   Vitals:   09/03/24 0522 09/03/24 0814 09/03/24 2052 09/04/24 0537  BP: (!) 100/48 132/63 (!) 133/48 (!) 121/55  Pulse: 69 86 85 84  Resp: 16 16 19 18   Temp: (!) 97.5 F (36.4 C) 98 F (36.7 C) 99.3 F (37.4 C) 97.8 F (36.6 C)  TempSrc:  Oral Oral Oral  SpO2: 97% 100% 100% 93%  Weight:      Height:        Exam stable   Lab Results  Component Value Date   WBC 9.5 08/25/2024   HGB 12.3 08/25/2024   HCT 39.0 08/25/2024   MCV 102.9 (H) 08/25/2024   PLT 475 (H) 08/25/2024     Assessment/Plan:  3 Days Post-Op   - d/c to [facility] sn[facility] today  Ozell Cummins 09/04/2024, 2:04 PM

## 2024-09-04 NOTE — Discharge Summary (Signed)
 Patient ID: Dorothy Anderson MRN: [id number] DOB/AGE: [birth year] 83 y.o.  Admit date: 09/01/2024 Discharge date: 09/05/2024  Admission Diagnoses:  Status post total replacement of right hip  Discharge Diagnoses:  Principal Problem:   Status post total replacement of right hip   Past Medical History:  Diagnosis Date   Anxiety    Arthritis KNEES   OA   Calculus of left kidney    Cancer (HCC)    left breast cancer   Complication of anesthesia    slow to wake up   Depression    Diabetes mellitus without complication (HCC)    PRE DIABETIC ORAL MEDS    Family history of breast cancer    Frequency of urination    GERD (gastroesophageal reflux disease)    Glaucoma BILATERAL EYES   History of kidney stones    Hydronephrosis, left    DR. STEVEN DAHLSTADT   Hyperglycemia    Hypertension    Macular degeneration    Nocturia    Wears glasses     Surgeries: Procedure(s): ARTHROPLASTY, HIP, TOTAL, ANTERIOR APPROACH on 09/01/2024   Consultants (if any):   Discharged Condition: Improved  Hospital Course: Dorothy Anderson is an 83 y.o. female who was admitted 09/01/2024 with a diagnosis of Status post total replacement of right hip and went to the operating room on 09/01/2024 and underwent the above named procedures.    She was given perioperative antibiotics:  Anti-infectives (From admission, onward)    Start     Dose/Rate Route Frequency Ordered Stop   09/05/24 0000  doxycycline (VIBRA-TABS) 100 MG tablet        100 mg Oral 2 times daily 09/05/24 0739     09/02/24 1000  doxycycline (VIBRA-TABS) tablet 100 mg       Note to Pharmacy: To be taken after surgery     100 mg Oral 2 times daily 09/01/24 1554     09/01/24 1600  ceFAZolin  (ANCEF ) IVPB 2g/100 mL premix        2 g 200 mL/hr over 30 Minutes Intravenous Every 6 hours 09/01/24 1554 09/01/24 2228   09/01/24 1049  vancomycin (VANCOCIN) powder  Status:  Discontinued          As needed 09/01/24 1049 09/01/24 1146    09/01/24 0800  ceFAZolin  (ANCEF ) IVPB 2g/100 mL premix        2 g 200 mL/hr over 30 Minutes Intravenous On call to O.R. 09/01/24 0747 09/01/24 1026     .  She was given sequential compression devices, early ambulation, and appropriate chemoprophylaxis for DVT prophylaxis.  She benefited maximally from the hospital stay and there were no complications.    Recent vital signs:  Vitals:   09/04/24 2203 09/05/24 0620  BP: 117/63 (!) 151/68  Pulse: 80 87  Resp: 16   Temp: 99 F (37.2 C) 98.4 F (36.9 C)  SpO2: 96% 96%    Recent laboratory studies:  Lab Results  Component Value Date   HGB 12.3 08/25/2024   HGB 12.0 02/07/2021   HGB 12.4 04/14/2019   Lab Results  Component Value Date   WBC 9.5 08/25/2024   PLT 475 (H) 08/25/2024   Lab Results  Component Value Date   INR 1.63 (H) 01/21/2013   Lab Results  Component Value Date   NA 138 08/25/2024   K 4.6 08/25/2024   CL 103 08/25/2024   CO2 24 08/25/2024   BUN 18 08/25/2024   CREATININE 0.95 08/25/2024  GLUCOSE 126 (H) 08/25/2024    Discharge Medications:   Allergies as of 09/05/2024       Reactions   Amiloride Other (See Comments)   High blood level of potassium Other Reaction(s): high blood level of potassium   Atorvastatin Calcium  Other (See Comments)   Muscle weakness and pain   Atorvastatin Calcium     Other Reaction(s): muscle weakness and pain   Emetrol    Other reaction(s): Unknown   Macrobid [nitrofurantoin Monohydrate Macrocrystals] Nausea And Vomiting   Propoxyphene Other (See Comments)   MADE PT STAY AWAKE        Medication List     TAKE these medications    anastrozole  1 MG tablet Commonly known as: ARIMIDEX  Take 1 tablet (1 mg total) by mouth daily.   aspirin 81 MG chewable tablet Commonly known as: Aspirin Childrens Chew 1 tablet (81 mg total) by mouth 2 (two) times daily. To be taken after surgery to prevent blood clots   brimonidine 0.2 % ophthalmic solution Commonly known as:  ALPHAGAN Place 1 drop into both eyes in the morning and at bedtime.   CRANBERRY CONCENTRATE PO Take 4,000 mg by mouth in the morning.   docusate sodium  100 MG capsule Commonly known as: Colace Take 1 capsule (100 mg total) by mouth daily as needed. What changed: Another medication with the same name was removed. Continue taking this medication, and follow the directions you see here.   doxycycline 100 MG tablet Commonly known as: VIBRA-TABS Take 1 tablet (100 mg total) by mouth 2 (two) times daily. To be taken after surgery   HYDROcodone -acetaminophen  5-325 MG tablet Commonly known as: NORCO/VICODIN Take 1 tablet by mouth 3 (three) times daily as needed for moderate pain (pain score 4-6). To be taken after surgery   lansoprazole 15 MG capsule Commonly known as: PREVACID Take 15 mg by mouth daily.   metFORMIN  500 MG 24 hr tablet Commonly known as: GLUCOPHAGE -XR Take 1,000 mg by mouth daily with supper.   methocarbamol  500 MG tablet Commonly known as: ROBAXIN  Take 1 tablet (500 mg total) by mouth 2 (two) times daily as needed.   metoprolol  tartrate 25 MG tablet Commonly known as: LOPRESSOR  Take 25 mg by mouth 2 (two) times daily.   multivitamin with minerals Tabs tablet Take 1 tablet by mouth daily.   ondansetron  4 MG tablet Commonly known as: Zofran  Take 1 tablet (4 mg total) by mouth every 8 (eight) hours as needed for nausea or vomiting.   oxybutynin  5 MG tablet Commonly known as: DITROPAN  Take 5 mg by mouth in the morning and at bedtime.   sertraline 50 MG tablet Commonly known as: ZOLOFT Take 50 mg by mouth daily.   simvastatin 10 MG tablet Commonly known as: ZOCOR Take 10 mg by mouth 3 (three) times a week.   Systane 0.4-0.3 % Soln Generic drug: Polyethyl Glycol-Propyl Glycol Place 1-2 drops into both eyes 3 (three) times daily as needed (dry/irritated eyes.).   trimethoprim  100 MG tablet Commonly known as: TRIMPEX  Take 100 mg by mouth at bedtime.    VITAMIN D3 PO Take 1 tablet by mouth in the morning.   Xalatan  0.005 % ophthalmic solution Generic drug: latanoprost  Place 1 drop into both eyes at bedtime.               Durable Medical Equipment  (From admission, onward)           Start     Ordered   09/01/24 1555  DME Walker rolling  Once       Question:  Patient needs a walker to treat with the following condition  Answer:  History of hip replacement   09/01/24 1554   09/01/24 1555  DME 3 n 1  Once        09/01/24 1554   09/01/24 1555  DME Bedside commode  Once       Question:  Patient needs a bedside commode to treat with the following condition  Answer:  History of hip replacement   09/01/24 1554            Diagnostic Studies: DG Pelvis Portable Result Date: 09/01/2024 CLINICAL DATA:  Status post right hip replacement. EXAM: PORTABLE PELVIS 1-2 VIEWS COMPARISON:  05/19/2024 FINDINGS: Right hip arthroplasty in expected alignment. Mild irregularity about the greater trochanter is likely related soft tissue calcifications as well as overlying gas in the soft tissues. Recent postsurgical change includes air and edema in the soft tissues. IMPRESSION: Right hip arthroplasty in expected alignment. Electronically Signed   By: Andrea Gasman M.D.   On: 09/01/2024 16:41   DG HIP UNILAT WITH PELVIS 1V RIGHT Result Date: 09/01/2024 CLINICAL DATA:  Elective surgery. EXAM: DG HIP (WITH OR WITHOUT PELVIS) 1V RIGHT COMPARISON:  None Available. FINDINGS: Four fluoroscopic spot views of the pelvis and right hip obtained in the operating room. Sequential images during hip arthroplasty. Fluoroscopy time 25 seconds. Dose 2.23 mGy. IMPRESSION: Intraoperative fluoroscopy during right hip arthroplasty. Electronically Signed   By: Andrea Gasman M.D.   On: 09/01/2024 11:46   DG C-Arm 1-60 Min-No Report Result Date: 09/01/2024 Fluoroscopy was utilized by the requesting physician.  No radiographic interpretation.   DG C-Arm 1-60  Min-No Report Result Date: 09/01/2024 Fluoroscopy was utilized by the requesting physician.  No radiographic interpretation.    Disposition: Discharge disposition: 03-Skilled Nursing Facility       Discharge Instructions     Call MD / Call 911   Complete by: As directed    If you experience chest pain or shortness of breath, CALL 911 and be transported to the hospital emergency room.  If you develope a fever above 101.5 F, pus (white drainage) or increased drainage or redness at the wound, or calf pain, call your surgeon's office.   Constipation Prevention   Complete by: As directed    Drink plenty of fluids.  Prune juice may be helpful.  You may use a stool softener, such as Colace (over the counter) 100 mg twice a day.  Use MiraLax (over the counter) for constipation as needed.   Driving restrictions   Complete by: As directed    No driving while taking narcotic pain meds.   Increase activity slowly as tolerated   Complete by: As directed    Post-operative opioid taper instructions:   Complete by: As directed    POST-OPERATIVE OPIOID TAPER INSTRUCTIONS: It is important to wean off of your opioid medication as soon as possible. If you do not need pain medication after your surgery it is ok to stop day one. Opioids include: Codeine, Hydrocodone (Norco, Vicodin), Oxycodone (Percocet, oxycontin ) and hydromorphone  amongst others.  Long term and even short term use of opiods can cause: Increased pain response Dependence Constipation Depression Respiratory depression And more.  Withdrawal symptoms can include Flu like symptoms Nausea, vomiting And more Techniques to manage these symptoms Hydrate well Eat regular healthy meals Stay active Use relaxation techniques(deep breathing, meditating, yoga) Do Not substitute Alcohol to help with tapering If  you have been on opioids for less than two weeks and do not have pain than it is ok to stop all together.  Plan to wean off of  opioids This plan should start within one week post op of your joint replacement. Maintain the same interval or time between taking each dose and first decrease the dose.  Cut the total daily intake of opioids by one tablet each day Next start to increase the time between doses. The last dose that should be eliminated is the evening dose.           Follow-up Information     Jule Ronal CROME, PA-C. Schedule an appointment as soon as possible for a visit in 2 week(s).   Specialty: Orthopedic Surgery Contact information: [address] [place]  [place] [address] [phone]                  Signed: Ronal CROME Jule 09/05/2024, 7:40 AM

## 2024-09-05 DIAGNOSIS — C50412 Malignant neoplasm of upper-outer quadrant of left female breast: Secondary | ICD-10-CM | POA: Diagnosis not present

## 2024-09-05 DIAGNOSIS — M6281 Muscle weakness (generalized): Secondary | ICD-10-CM | POA: Diagnosis not present

## 2024-09-05 DIAGNOSIS — Z170 Estrogen receptor positive status [ER+]: Secondary | ICD-10-CM | POA: Diagnosis not present

## 2024-09-05 DIAGNOSIS — I959 Hypotension, unspecified: Secondary | ICD-10-CM | POA: Diagnosis not present

## 2024-09-05 DIAGNOSIS — L89153 Pressure ulcer of sacral region, stage 3: Secondary | ICD-10-CM | POA: Diagnosis not present

## 2024-09-05 DIAGNOSIS — Z471 Aftercare following joint replacement surgery: Secondary | ICD-10-CM | POA: Diagnosis not present

## 2024-09-05 DIAGNOSIS — H9193 Unspecified hearing loss, bilateral: Secondary | ICD-10-CM | POA: Diagnosis not present

## 2024-09-05 DIAGNOSIS — K219 Gastro-esophageal reflux disease without esophagitis: Secondary | ICD-10-CM | POA: Diagnosis not present

## 2024-09-05 DIAGNOSIS — E1169 Type 2 diabetes mellitus with other specified complication: Secondary | ICD-10-CM | POA: Diagnosis not present

## 2024-09-05 DIAGNOSIS — E119 Type 2 diabetes mellitus without complications: Secondary | ICD-10-CM | POA: Diagnosis not present

## 2024-09-05 DIAGNOSIS — R278 Other lack of coordination: Secondary | ICD-10-CM | POA: Diagnosis not present

## 2024-09-05 DIAGNOSIS — K5900 Constipation, unspecified: Secondary | ICD-10-CM | POA: Diagnosis not present

## 2024-09-05 DIAGNOSIS — R197 Diarrhea, unspecified: Secondary | ICD-10-CM | POA: Diagnosis not present

## 2024-09-05 DIAGNOSIS — Z96641 Presence of right artificial hip joint: Secondary | ICD-10-CM | POA: Diagnosis not present

## 2024-09-05 DIAGNOSIS — M25551 Pain in right hip: Secondary | ICD-10-CM | POA: Diagnosis not present

## 2024-09-05 DIAGNOSIS — M84351A Stress fracture, right femur, initial encounter for fracture: Secondary | ICD-10-CM | POA: Diagnosis not present

## 2024-09-05 DIAGNOSIS — H04123 Dry eye syndrome of bilateral lacrimal glands: Secondary | ICD-10-CM | POA: Diagnosis not present

## 2024-09-05 DIAGNOSIS — Z7401 Bed confinement status: Secondary | ICD-10-CM | POA: Diagnosis not present

## 2024-09-05 DIAGNOSIS — N3289 Other specified disorders of bladder: Secondary | ICD-10-CM | POA: Diagnosis not present

## 2024-09-05 DIAGNOSIS — R2681 Unsteadiness on feet: Secondary | ICD-10-CM | POA: Diagnosis not present

## 2024-09-05 DIAGNOSIS — R112 Nausea with vomiting, unspecified: Secondary | ICD-10-CM | POA: Diagnosis not present

## 2024-09-05 DIAGNOSIS — M1611 Unilateral primary osteoarthritis, right hip: Secondary | ICD-10-CM | POA: Diagnosis not present

## 2024-09-05 DIAGNOSIS — M62838 Other muscle spasm: Secondary | ICD-10-CM | POA: Diagnosis not present

## 2024-09-05 DIAGNOSIS — I10 Essential (primary) hypertension: Secondary | ICD-10-CM | POA: Diagnosis not present

## 2024-09-05 DIAGNOSIS — M84351D Stress fracture, right femur, subsequent encounter for fracture with routine healing: Secondary | ICD-10-CM | POA: Diagnosis not present

## 2024-09-05 DIAGNOSIS — F32A Depression, unspecified: Secondary | ICD-10-CM | POA: Diagnosis not present

## 2024-09-05 DIAGNOSIS — Z87440 Personal history of urinary (tract) infections: Secondary | ICD-10-CM | POA: Diagnosis not present

## 2024-09-05 LAB — GLUCOSE, CAPILLARY
Glucose-Capillary: 173 mg/dL — ABNORMAL HIGH (ref 70–99)
Glucose-Capillary: 184 mg/dL — ABNORMAL HIGH (ref 70–99)

## 2024-09-05 MED ORDER — ASPIRIN 81 MG PO CHEW: 81.0000 mg | CHEWABLE_TABLET | Freq: Two times a day (BID) | ORAL | 0 refills | Status: AC

## 2024-09-05 MED ORDER — HYDROCODONE-ACETAMINOPHEN 5-325 MG PO TABS: 1.0000 | ORAL_TABLET | Freq: Three times a day (TID) | ORAL | 0 refills | Status: AC | PRN

## 2024-09-05 MED ORDER — DOXYCYCLINE HYCLATE 100 MG PO TABS: 100.0000 mg | ORAL_TABLET | Freq: Two times a day (BID) | ORAL | 0 refills | Status: AC

## 2024-09-05 NOTE — TOC Transition Note (Signed)
 Transition of Care [facility]) - Discharge Note   Patient Details  Name: Dorothy Anderson MRN: [id number] Date of Birth: [birth year]  Transition of Care [facility]) CM/SW Contact:  Alfonse JONELLE Rex, RN Phone Number: 09/05/2024, 1:15 PM   Clinical Narrative:   DC to [facility] [place], [address]. [facility] for transport. No further TOC needs identified at this time.      Final next level of care: Skilled Nursing Facility Barriers to Discharge: Barriers Resolved   Patient Goals and CMS Choice Patient states their goals for this hospitalization and ongoing recovery are:: short term rehab CMS Medicare.gov Compare Post Acute Care list provided to:: Patient Choice offered to / list presented to : Patient [place] ownership interest in [facility].provided to:: Patient    Discharge Placement              Patient chooses bed at: [facility] [place] Patient to be transferred to facility by: [facility] Name of family member notified: Mick Perry (Daughter)  [phone] (Mobile) Patient and family notified of of transfer: 09/05/24  Discharge Plan and Services Additional resources added to the After Visit Summary for                  DME Arranged: N/A DME Agency: NA                  Social Drivers of Health (SDOH) Interventions SDOH Screenings   Food Insecurity: No Food Insecurity (09/03/2024)  Housing: Low Risk  (09/03/2024)  Transportation Needs: No Transportation Needs (09/03/2024)  Utilities: Not At Risk (09/03/2024)  Social Connections: Moderately Isolated (09/01/2024)  Tobacco Use: Low Risk  (09/01/2024)     Readmission Risk Interventions    09/05/2024    1:14 PM 09/04/2024    3:00 PM  Readmission Risk Prevention Plan  Post Dischage Appt Complete Complete  Medication Screening Complete Complete  Transportation Screening Complete Complete

## 2024-09-05 NOTE — Progress Notes (Signed)
 Subjective: 4 Days Post-Op Procedure(s) (LRB): ARTHROPLASTY, HIP, TOTAL, ANTERIOR APPROACH (Right) Patient reports pain as mild.    Objective: Vital signs in last 24 hours: Temp:  [97.8 F (36.6 C)-99 F (37.2 C)] 98.4 F (36.9 C) (10/28 0620) Pulse Rate:  [80-92] 87 (10/28 0620) Resp:  [16-20] 16 (10/27 2203) BP: (116-151)/(53-68) 151/68 (10/28 0620) SpO2:  [96 %-97 %] 96 % (10/28 0620)  Intake/Output from previous day: 10/27 0701 - 10/28 0700 In: 420 [P.O.:420] Out: -  Intake/Output this shift: No intake/output data recorded.  No results for input(s): HGB in the last 72 hours. No results for input(s): WBC, RBC, HCT, PLT in the last 72 hours. No results for input(s): NA, K, CL, CO2, BUN, CREATININE, GLUCOSE, CALCIUM  in the last 72 hours. No results for input(s): LABPT, INR in the last 72 hours.  Neurologically intact Neurovascular intact Sensation intact distally Intact pulses distally Dorsiflexion/Plantar flexion intact Incision: dressing C/D/I No cellulitis present Compartment soft   Assessment/Plan: 4 Days Post-Op Procedure(s) (LRB): ARTHROPLASTY, HIP, TOTAL, ANTERIOR APPROACH (Right) Advance diet Up with therapy D/C IV fluids Discharge to SNF [facility]AT RLE     Ronal LITTIE Grave 09/05/2024, 7:39 AM

## 2024-09-05 NOTE — Progress Notes (Signed)
 Patient is ready for discharge, resting comfortably. Report was given to [facility] from [facility] facility, all questions answered, discharge instructions in the packet.

## 2024-09-08 DIAGNOSIS — F32A Depression, unspecified: Secondary | ICD-10-CM | POA: Diagnosis not present

## 2024-09-08 DIAGNOSIS — I10 Essential (primary) hypertension: Secondary | ICD-10-CM | POA: Diagnosis not present

## 2024-09-08 DIAGNOSIS — Z170 Estrogen receptor positive status [ER+]: Secondary | ICD-10-CM | POA: Diagnosis not present

## 2024-09-08 DIAGNOSIS — E1169 Type 2 diabetes mellitus with other specified complication: Secondary | ICD-10-CM | POA: Diagnosis not present

## 2024-09-10 DIAGNOSIS — H9193 Unspecified hearing loss, bilateral: Secondary | ICD-10-CM | POA: Diagnosis not present

## 2024-09-10 DIAGNOSIS — I10 Essential (primary) hypertension: Secondary | ICD-10-CM | POA: Diagnosis not present

## 2024-09-10 DIAGNOSIS — Z96641 Presence of right artificial hip joint: Secondary | ICD-10-CM | POA: Diagnosis not present

## 2024-09-10 DIAGNOSIS — E119 Type 2 diabetes mellitus without complications: Secondary | ICD-10-CM | POA: Diagnosis not present

## 2024-09-10 DIAGNOSIS — R197 Diarrhea, unspecified: Secondary | ICD-10-CM | POA: Diagnosis not present

## 2024-09-10 DIAGNOSIS — Z87440 Personal history of urinary (tract) infections: Secondary | ICD-10-CM | POA: Diagnosis not present

## 2024-09-11 ENCOUNTER — Encounter: Payer: Self-pay | Admitting: Radiology

## 2024-09-14 ENCOUNTER — Other Ambulatory Visit: Payer: Self-pay

## 2024-09-14 ENCOUNTER — Ambulatory Visit (INDEPENDENT_AMBULATORY_CARE_PROVIDER_SITE_OTHER): Admitting: Physician Assistant

## 2024-09-14 DIAGNOSIS — M1611 Unilateral primary osteoarthritis, right hip: Secondary | ICD-10-CM | POA: Diagnosis not present

## 2024-09-14 DIAGNOSIS — M84351A Stress fracture, right femur, initial encounter for fracture: Secondary | ICD-10-CM

## 2024-09-14 NOTE — Progress Notes (Signed)
 Post-Op Visit Note   Patient: Dorothy Anderson           Date of Birth: 02-28-1941           MRN: [id number] Visit Date: 09/14/2024 PCP: Elliot Charm, MD   Assessment & Plan:  Chief Complaint:  Chief Complaint  Patient presents with   Right Hip - Routine Post Op    R THA   Visit Diagnoses:  1. Stress fracture of neck of right femur   2. Primary osteoarthritis of right hip     Plan: Patient is a very pleasant 83 year old female who comes in today 2 weeks status post right total hip replacement.  She has been residing at [facility] facility where she is getting physical therapy and ambulating with a walker.  She has been taking hydrocodone  and Robaxin  for pain.  She has been pliant taking aspirin twice daily for DVT prophylaxis.  Examination of the right hip reveals well-healing surgical incision with nylon sutures in place.  No evidence of infection or cellulitis.  Calves are soft nontender.  She is neurovascular intact distally.  Today, sutures were removed and Steri-Strips applied.  She will continue with a baby aspirin twice daily for 4 weeks.  She will follow-up with us  in 4 weeks for repeat evaluation and AP pelvis x-rays.  Call with concerns or questions.  Follow-Up Instructions: Return in about 4 weeks (around 10/12/2024).   Orders:  Orders Placed This Encounter  Procedures   XR Pelvis 1-2 Views   No orders of the defined types were placed in this encounter.   Imaging: XR Pelvis 1-2 Views Result Date: 09/14/2024 Well-seated prosthesis without complication   PMFS History: Patient Active Problem List   Diagnosis Date Noted   Status post total replacement of right hip 09/01/2024   Stress fracture of neck of right femur 07/14/2024   Primary osteoarthritis of right hip 07/14/2024   Nasal bones, closed fracture 12/26/2023   Genetic testing 11/11/2023   Malignant neoplasm of upper-outer quadrant of left breast in female, estrogen receptor positive (HCC)  10/22/2023   Family history of breast cancer    Chronic kidney disease, stage 2 (mild) 04/22/2021   Degeneration of lumbar intervertebral disc 04/22/2021   Osteopenia 04/22/2021   Abnormal findings on diagnostic imaging of other specified body structures 10/26/2020   Body mass index (BMI) 29.0-29.9, adult 10/26/2020   Esophageal reflux 10/26/2020   Overactive bladder 10/26/2020   History of colonic polyps 10/26/2020   Ptosis of both eyelids 01/28/2015   Diabetes type 2, controlled (HCC) 10/25/2014   High blood pressure 10/25/2014   Osteoarthritis of left knee 01/17/2013    Class: Diagnosis of   Osteoarthrosis involving lower leg 01/17/2013   Past Medical History:  Diagnosis Date   Anxiety    Arthritis KNEES   OA   Calculus of left kidney    Cancer (HCC)    left breast cancer   Complication of anesthesia    slow to wake up   Depression    Diabetes mellitus without complication (HCC)    PRE DIABETIC ORAL MEDS    Family history of breast cancer    Frequency of urination    GERD (gastroesophageal reflux disease)    Glaucoma BILATERAL EYES   History of kidney stones    Hydronephrosis, left    DR. STEVEN DAHLSTADT   Hyperglycemia    Hypertension    Macular degeneration    Nocturia    Wears glasses  Family History  Problem Relation Age of Onset   CVA Mother    Heart attack Father    Breast cancer Sister 67   Breast cancer Niece 43    Past Surgical History:  Procedure Laterality Date   BREAST BIOPSY Left 09/10/2023   US  LT BREAST BX W LOC DEV 1ST LESION IMG BX SPEC US  GUIDE 09/10/2023 [facility]   BREAST BIOPSY  09/27/2023   MM LT RADIOACTIVE SEED LOC MAMMO GUIDE 09/27/2023 [facility]   BREAST LUMPECTOMY WITH RADIOACTIVE SEED LOCALIZATION Left 09/29/2023   Procedure: LEFT BREAST SEED LOCALIZED LUMPECTOMY;  Surgeon: Aron Shoulders, MD;  Location: [facility];  Service: General;  Laterality: Left;   CARPAL TUNNEL RELEASE  06-17-2005    RIGHT   CATARACT EXTRACTION W/ INTRAOCULAR LENS  IMPLANT, BILATERAL     CYSTO/ LEFT RETROGRADE PYELOGRAM/ LEFT URETERAL STENT PLACEMENT  04-15-2005   URETERAL STONE   CYSTOSCOPY/RETROGRADE/URETEROSCOPY  01/27/2012   Procedure: CYSTOSCOPY/RETROGRADE/URETEROSCOPY;  Surgeon: Garnette Shack, MD;  Location: [facility];  Service: Urology;  Laterality: Right;   EXTRACORPOREAL SHOCK WAVE LITHOTRIPSY  11-09-11   RIGHT   TONSILLECTOMY AND ADENOIDECTOMY  AGE 37   TOTAL HIP ARTHROPLASTY Right 09/01/2024   Procedure: ARTHROPLASTY, HIP, TOTAL, ANTERIOR APPROACH;  Surgeon: Jerri Kay HERO, MD;  Location: [facility];  Service: Orthopedics;  Laterality: Right;   TOTAL KNEE ARTHROPLASTY  09-29-2005   RIGHT   TOTAL KNEE ARTHROPLASTY Left 01/17/2013   Procedure: TOTAL KNEE ARTHROPLASTY;  Surgeon: Cordella Glendia Hutchinson, MD;  Location: [facility];  Service: Orthopedics;  Laterality: Left;  Left Total Knee Arthroplasty   Social History   Occupational History   Not on file  Tobacco Use   Smoking status: Never   Smokeless tobacco: Never  Vaping Use   Vaping status: Never Used  Substance and Sexual Activity   Alcohol use: No   Drug use: No   Sexual activity: Not Currently    Birth control/protection: Post-menopausal

## 2024-09-17 ENCOUNTER — Encounter: Payer: Self-pay | Admitting: Orthopaedic Surgery

## 2024-09-17 ENCOUNTER — Encounter: Payer: Self-pay | Admitting: Podiatry

## 2024-09-19 ENCOUNTER — Ambulatory Visit (INDEPENDENT_AMBULATORY_CARE_PROVIDER_SITE_OTHER): Admitting: Podiatry

## 2024-09-19 DIAGNOSIS — Z91198 Patient's noncompliance with other medical treatment and regimen for other reason: Secondary | ICD-10-CM

## 2024-09-26 NOTE — Progress Notes (Signed)
 1. Failure to attend appointment with reason given    Appointment rescheduled by patient.

## 2024-10-03 DIAGNOSIS — Z96642 Presence of left artificial hip joint: Secondary | ICD-10-CM | POA: Diagnosis not present

## 2024-10-03 DIAGNOSIS — E1169 Type 2 diabetes mellitus with other specified complication: Secondary | ICD-10-CM | POA: Diagnosis not present

## 2024-10-03 DIAGNOSIS — G8918 Other acute postprocedural pain: Secondary | ICD-10-CM | POA: Diagnosis not present

## 2024-10-03 DIAGNOSIS — Z23 Encounter for immunization: Secondary | ICD-10-CM | POA: Diagnosis not present

## 2024-10-08 DIAGNOSIS — F32A Depression, unspecified: Secondary | ICD-10-CM | POA: Diagnosis not present

## 2024-10-08 DIAGNOSIS — I10 Essential (primary) hypertension: Secondary | ICD-10-CM | POA: Diagnosis not present

## 2024-10-08 DIAGNOSIS — E1169 Type 2 diabetes mellitus with other specified complication: Secondary | ICD-10-CM | POA: Diagnosis not present

## 2024-10-08 DIAGNOSIS — Z170 Estrogen receptor positive status [ER+]: Secondary | ICD-10-CM | POA: Diagnosis not present

## 2024-10-12 ENCOUNTER — Other Ambulatory Visit: Payer: Self-pay | Admitting: Hematology and Oncology

## 2024-10-12 ENCOUNTER — Encounter: Admitting: Physician Assistant

## 2024-10-13 ENCOUNTER — Ambulatory Visit: Admitting: Physician Assistant

## 2024-10-13 ENCOUNTER — Encounter: Payer: Self-pay | Admitting: Physician Assistant

## 2024-10-13 ENCOUNTER — Other Ambulatory Visit: Payer: Self-pay

## 2024-10-13 DIAGNOSIS — H35443 Age-related reticular degeneration of retina, bilateral: Secondary | ICD-10-CM | POA: Diagnosis not present

## 2024-10-13 DIAGNOSIS — Z96641 Presence of right artificial hip joint: Secondary | ICD-10-CM

## 2024-10-13 DIAGNOSIS — H401134 Primary open-angle glaucoma, bilateral, indeterminate stage: Secondary | ICD-10-CM | POA: Diagnosis not present

## 2024-10-13 DIAGNOSIS — H18593 Other hereditary corneal dystrophies, bilateral: Secondary | ICD-10-CM | POA: Diagnosis not present

## 2024-10-13 DIAGNOSIS — H04123 Dry eye syndrome of bilateral lacrimal glands: Secondary | ICD-10-CM | POA: Diagnosis not present

## 2024-10-13 DIAGNOSIS — H353211 Exudative age-related macular degeneration, right eye, with active choroidal neovascularization: Secondary | ICD-10-CM | POA: Diagnosis not present

## 2024-10-13 DIAGNOSIS — H43823 Vitreomacular adhesion, bilateral: Secondary | ICD-10-CM | POA: Diagnosis not present

## 2024-10-13 DIAGNOSIS — H353121 Nonexudative age-related macular degeneration, left eye, early dry stage: Secondary | ICD-10-CM | POA: Diagnosis not present

## 2024-10-13 NOTE — Progress Notes (Signed)
 Post-Op Visit Note   Patient: Dorothy Anderson           Date of Birth: 1941-05-04           MRN: [id number] Visit Date: 10/13/2024 PCP: Elliot Charm, MD   Assessment & Plan:  Chief Complaint:  Chief Complaint  Patient presents with   Right Hip - Routine Post Op   Visit Diagnoses:  1. History of total hip replacement, right     Plan: Patient is a pleasant 83 year old female who comes in today 6 weeks status post right total hip replacement date of surgery 09/01/2024.  She has been doing well.  Currently ambulating with a walker.  Examination of her right hip reveals painless hip flexion and logroll.  She is neurovascularly intact distally.  At this point, she will continue to advance with activity as tolerated.  Follow-up in 6 weeks for repeat evaluation.  Call with concerns or questions.  Follow-Up Instructions: Return in about 6 weeks (around 11/24/2024).   Orders:  Orders Placed This Encounter  Procedures   XR Pelvis 1-2 Views   No orders of the defined types were placed in this encounter.   Imaging: XR Pelvis 1-2 Views Result Date: 10/13/2024 Well-seated prosthesis without complication   PMFS History: Patient Active Problem List   Diagnosis Date Noted   Status post total replacement of right hip 09/01/2024   Stress fracture of neck of right femur 07/14/2024   Primary osteoarthritis of right hip 07/14/2024   Nasal bones, closed fracture 12/26/2023   Genetic testing 11/11/2023   Malignant neoplasm of upper-outer quadrant of left breast in female, estrogen receptor positive (HCC) 10/22/2023   Family history of breast cancer    Chronic kidney disease, stage 2 (mild) 04/22/2021   Degeneration of lumbar intervertebral disc 04/22/2021   Osteopenia 04/22/2021   Abnormal findings on diagnostic imaging of other specified body structures 10/26/2020   Body mass index (BMI) 29.0-29.9, adult 10/26/2020   Esophageal reflux 10/26/2020   Overactive bladder 10/26/2020    History of colonic polyps 10/26/2020   Ptosis of both eyelids 01/28/2015   Diabetes type 2, controlled (HCC) 10/25/2014   High blood pressure 10/25/2014   Osteoarthritis of left knee 01/17/2013    Class: Diagnosis of   Osteoarthrosis involving lower leg 01/17/2013   Past Medical History:  Diagnosis Date   Anxiety    Arthritis KNEES   OA   Calculus of left kidney    Cancer (HCC)    left breast cancer   Complication of anesthesia    slow to wake up   Depression    Diabetes mellitus without complication (HCC)    PRE DIABETIC ORAL MEDS    Family history of breast cancer    Frequency of urination    GERD (gastroesophageal reflux disease)    Glaucoma BILATERAL EYES   History of kidney stones    Hydronephrosis, left    DR. STEVEN DAHLSTADT   Hyperglycemia    Hypertension    Macular degeneration    Nocturia    Wears glasses     Family History  Problem Relation Age of Onset   CVA Mother    Heart attack Father    Breast cancer Sister 78   Breast cancer Niece 93    Past Surgical History:  Procedure Laterality Date   BREAST BIOPSY Left 09/10/2023   US  LT BREAST BX W LOC DEV 1ST LESION IMG BX SPEC US  GUIDE 09/10/2023 [facility]   BREAST BIOPSY  09/27/2023   MM LT RADIOACTIVE SEED LOC MAMMO GUIDE 09/27/2023 [facility]   BREAST LUMPECTOMY WITH RADIOACTIVE SEED LOCALIZATION Left 09/29/2023   Procedure: LEFT BREAST SEED LOCALIZED LUMPECTOMY;  Surgeon: Aron Shoulders, MD;  Location: [facility];  Service: General;  Laterality: Left;   CARPAL TUNNEL RELEASE  06-17-2005   RIGHT   CATARACT EXTRACTION W/ INTRAOCULAR LENS  IMPLANT, BILATERAL     CYSTO/ LEFT RETROGRADE PYELOGRAM/ LEFT URETERAL STENT PLACEMENT  04-15-2005   URETERAL STONE   CYSTOSCOPY/RETROGRADE/URETEROSCOPY  01/27/2012   Procedure: CYSTOSCOPY/RETROGRADE/URETEROSCOPY;  Surgeon: Garnette Shack, MD;  Location: [facility];  Service: Urology;  Laterality: Right;    EXTRACORPOREAL SHOCK WAVE LITHOTRIPSY  11-09-11   RIGHT   TONSILLECTOMY AND ADENOIDECTOMY  AGE 65   TOTAL HIP ARTHROPLASTY Right 09/01/2024   Procedure: ARTHROPLASTY, HIP, TOTAL, ANTERIOR APPROACH;  Surgeon: Jerri Kay HERO, MD;  Location: [facility];  Service: Orthopedics;  Laterality: Right;   TOTAL KNEE ARTHROPLASTY  09-29-2005   RIGHT   TOTAL KNEE ARTHROPLASTY Left 01/17/2013   Procedure: TOTAL KNEE ARTHROPLASTY;  Surgeon: Cordella Glendia Hutchinson, MD;  Location: [facility];  Service: Orthopedics;  Laterality: Left;  Left Total Knee Arthroplasty   Social History   Occupational History   Not on file  Tobacco Use   Smoking status: Never   Smokeless tobacco: Never  Vaping Use   Vaping status: Never Used  Substance and Sexual Activity   Alcohol  use: No   Drug use: No   Sexual activity: Not Currently    Birth control/protection: Post-menopausal

## 2024-10-26 ENCOUNTER — Other Ambulatory Visit: Payer: Self-pay

## 2024-10-26 ENCOUNTER — Telehealth: Payer: Self-pay

## 2024-10-26 DIAGNOSIS — C50412 Malignant neoplasm of upper-outer quadrant of left female breast: Secondary | ICD-10-CM

## 2024-10-26 NOTE — Telephone Encounter (Signed)
 Patient Communication Note  Reason for call: Patient inquired about scheduled mammogram ordered by [facility].  Issue identified: Patient reported not receiving a call for scheduling.  Chart review: Noted mammogram was documented for October 2025 rather than May 2026.  Action taken: Order modified to reflect correct timing. Patient advised to contact the breast center directly to arrange scheduling.  Plan: Will follow up to confirm appointment is scheduled to ensure patient receives mammogram as ordered and on time.

## 2024-10-27 ENCOUNTER — Telehealth: Payer: Self-pay

## 2024-10-27 NOTE — Telephone Encounter (Signed)
 Patient called, unaware of who attempted to contact her. A message from the breast center was noted in the chart. Contacted the breast center to assist the patient with scheduling her mammogram. Appointment scheduled for 12/22 at 0830.

## 2024-10-30 ENCOUNTER — Inpatient Hospital Stay: Admission: RE | Admit: 2024-10-30 | Discharge: 2024-10-30 | Attending: Adult Health | Admitting: Adult Health

## 2024-10-30 ENCOUNTER — Ambulatory Visit
Admission: RE | Admit: 2024-10-30 | Discharge: 2024-10-30 | Disposition: A | Source: Ambulatory Visit | Attending: Adult Health | Admitting: Adult Health

## 2024-10-30 ENCOUNTER — Other Ambulatory Visit: Payer: Self-pay | Admitting: Adult Health

## 2024-10-30 DIAGNOSIS — Z170 Estrogen receptor positive status [ER+]: Secondary | ICD-10-CM

## 2024-10-30 DIAGNOSIS — N6320 Unspecified lump in the left breast, unspecified quadrant: Secondary | ICD-10-CM

## 2024-11-07 ENCOUNTER — Ambulatory Visit
Admission: RE | Admit: 2024-11-07 | Discharge: 2024-11-07 | Disposition: A | Discharge status: Home / Self Care | Source: Ambulatory Visit | Attending: Adult Health | Admitting: Adult Health

## 2024-11-07 DIAGNOSIS — N6320 Unspecified lump in the left breast, unspecified quadrant: Secondary | ICD-10-CM

## 2024-11-07 DIAGNOSIS — Z170 Estrogen receptor positive status [ER+]: Secondary | ICD-10-CM

## 2024-11-07 HISTORY — PX: BREAST BIOPSY: SHX20

## 2024-11-08 LAB — SURGICAL PATHOLOGY

## 2024-11-28 ENCOUNTER — Ambulatory Visit: Admitting: Physician Assistant

## 2024-11-28 DIAGNOSIS — Z96641 Presence of right artificial hip joint: Secondary | ICD-10-CM

## 2024-11-28 NOTE — Progress Notes (Signed)
 "  Post-Op Visit Note   Patient: Dorothy Anderson           Date of Birth: [birth year]           MRN: [id number] Visit Date: 11/28/2024 PCP: Elliot Charm, MD   Assessment & Plan:  Chief Complaint:  Chief Complaint  Patient presents with   Right Hip - Routine Post Op   Visit Diagnoses:  1. Status post total replacement of right hip     Plan: Patient is a pleasant 84 year old female who comes in today 3 months status post right total hip replacement.  She is doing well.  No pain to the right hip.  She is getting home health physical therapy to help with strengthening of the right lower extremity.  She is still having some low back pain which has been chronic and unchanged since surgery.  She is taking Norco for this prescribed by her PCP.  Right hip exam: Painless hip flexion logroll.  She is neurovascular intact distally.  At this point, she will continue with PT.  Follow-up in 3 months for repeat evaluation and AP pelvis x-rays.  Call with concerns or questions.  Follow-Up Instructions: Return in about 3 months (around 02/26/2025).   Orders:  No orders of the defined types were placed in this encounter.  No orders of the defined types were placed in this encounter.   Imaging: No new imaging  PMFS History: Patient Active Problem List   Diagnosis Date Noted   Status post total replacement of right hip 09/01/2024   Stress fracture of neck of right femur 07/14/2024   Primary osteoarthritis of right hip 07/14/2024   Nasal bones, closed fracture 12/26/2023   Genetic testing 11/11/2023   Malignant neoplasm of upper-outer quadrant of left breast in female, estrogen receptor positive (HCC) 10/22/2023   Family history of breast cancer    Chronic kidney disease, stage 2 (mild) 04/22/2021   Degeneration of lumbar intervertebral disc 04/22/2021   Osteopenia 04/22/2021   Abnormal findings on diagnostic imaging of other specified body structures 10/26/2020   Body mass index (BMI)  29.0-29.9, adult 10/26/2020   Esophageal reflux 10/26/2020   Overactive bladder 10/26/2020   History of colonic polyps 10/26/2020   Ptosis of both eyelids 01/28/2015   Diabetes type 2, controlled (HCC) 10/25/2014   High blood pressure 10/25/2014   Osteoarthritis of left knee 01/17/2013    Class: Diagnosis of   Osteoarthrosis involving lower leg 01/17/2013   Past Medical History:  Diagnosis Date   Anxiety    Arthritis KNEES   OA   Calculus of left kidney    Cancer (HCC)    left breast cancer   Complication of anesthesia    slow to wake up   Depression    Diabetes mellitus without complication (HCC)    PRE DIABETIC ORAL MEDS    Family history of breast cancer    Frequency of urination    GERD (gastroesophageal reflux disease)    Glaucoma BILATERAL EYES   History of kidney stones    Hydronephrosis, left    DR. STEVEN DAHLSTADT   Hyperglycemia    Hypertension    Macular degeneration    Nocturia    Wears glasses     Family History  Problem Relation Age of Onset   CVA Mother    Heart attack Father    Breast cancer Sister 60   Breast cancer Niece 45    Past Surgical History:  Procedure Laterality Date  BREAST BIOPSY Left 09/10/2023   US  LT BREAST BX W LOC DEV 1ST LESION IMG BX SPEC US  GUIDE 09/10/2023 [facility]   BREAST BIOPSY  09/27/2023   MM LT RADIOACTIVE SEED LOC MAMMO GUIDE 09/27/2023 [facility]   BREAST BIOPSY Left 11/07/2024   US  LT BREAST BX W LOC DEV 1ST LESION IMG BX SPEC US  GUIDE 11/07/2024 [facility]   BREAST LUMPECTOMY WITH RADIOACTIVE SEED LOCALIZATION Left 09/29/2023   Procedure: LEFT BREAST SEED LOCALIZED LUMPECTOMY;  Surgeon: Aron Shoulders, MD;  Location: [facility];  Service: General;  Laterality: Left;   CARPAL TUNNEL RELEASE  06-17-2005   RIGHT   CATARACT EXTRACTION W/ INTRAOCULAR LENS  IMPLANT, BILATERAL     CYSTO/ LEFT RETROGRADE PYELOGRAM/ LEFT URETERAL STENT PLACEMENT  04-15-2005   URETERAL STONE    CYSTOSCOPY/RETROGRADE/URETEROSCOPY  01/27/2012   Procedure: CYSTOSCOPY/RETROGRADE/URETEROSCOPY;  Surgeon: Garnette Shack, MD;  Location[facility];  Service: Urology;  Laterality: Right;   EXTRACORPOREAL SHOCK WAVE LITHOTRIPSY  11-09-11   RIGHT   TONSILLECTOMY AND ADENOIDECTOMY  AGE 73   TOTAL HIP ARTHROPLASTY Right 09/01/2024   Procedure: ARTHROPLASTY, HIP, TOTAL, ANTERIOR APPROACH;  Surgeon: Jerri Kay HERO, MD;  Location: [facility];  Service: Orthopedics;  Laterality: Right;   TOTAL KNEE ARTHROPLASTY  09-29-2005   RIGHT   TOTAL KNEE ARTHROPLASTY Left 01/17/2013   Procedure: TOTAL KNEE ARTHROPLASTY;  Surgeon: Cordella Glendia Hutchinson, MD;  Location: [facility];  Service: Orthopedics;  Laterality: Left;  Left Total Knee Arthroplasty   Social History   Occupational History   Not on file  Tobacco Use   Smoking status: Never   Smokeless tobacco: Never  Vaping Use   Vaping status: Never Used  Substance and Sexual Activity   Alcohol  use: No   Drug use: No   Sexual activity: Not Currently    Birth control/protection: Post-menopausal     "

## 2024-12-03 ENCOUNTER — Other Ambulatory Visit: Payer: Self-pay | Admitting: Physician Assistant

## 2025-01-02 ENCOUNTER — Ambulatory Visit: Admitting: Podiatry

## 2025-02-27 ENCOUNTER — Ambulatory Visit: Admitting: Physician Assistant

## 2025-08-29 ENCOUNTER — Inpatient Hospital Stay: Admitting: Hematology and Oncology
